# Patient Record
Sex: Male | Born: 1960 | Race: White | Hispanic: No | Smoking: Current some day smoker
Health system: Southern US, Community
[De-identification: ages and names within clinical notes are randomized; demographics above are authoritative.]

## PROBLEM LIST (undated history)

## (undated) DIAGNOSIS — K259 Gastric ulcer, unspecified as acute or chronic, without hemorrhage or perforation: Secondary | ICD-10-CM

## (undated) DIAGNOSIS — Z9289 Personal history of other medical treatment: Secondary | ICD-10-CM

## (undated) DIAGNOSIS — M79606 Pain in leg, unspecified: Secondary | ICD-10-CM

## (undated) DIAGNOSIS — F32A Depression, unspecified: Secondary | ICD-10-CM

## (undated) DIAGNOSIS — I639 Cerebral infarction, unspecified: Secondary | ICD-10-CM

## (undated) DIAGNOSIS — Z720 Tobacco use: Secondary | ICD-10-CM

## (undated) DIAGNOSIS — J449 Chronic obstructive pulmonary disease, unspecified: Secondary | ICD-10-CM

## (undated) DIAGNOSIS — F419 Anxiety disorder, unspecified: Secondary | ICD-10-CM

## (undated) HISTORY — DX: Cerebral infarction, unspecified: I63.9

## (undated) HISTORY — DX: Personal history of other medical treatment: Z92.89

## (undated) HISTORY — PX: ANKLE FRACTURE SURGERY: SHX122

## (undated) HISTORY — DX: Tobacco use: Z72.0

## (undated) HISTORY — DX: Pain in leg, unspecified: M79.606

## (undated) HISTORY — PX: APPENDECTOMY: SHX54

---

## 2004-06-04 ENCOUNTER — Inpatient Hospital Stay (HOSPITAL_COMMUNITY): Admission: EM | Admit: 2004-06-04 | Discharge: 2004-06-05 | Payer: Self-pay | Admitting: Emergency Medicine

## 2010-08-14 ENCOUNTER — Emergency Department (HOSPITAL_COMMUNITY)
Admission: EM | Admit: 2010-08-14 | Discharge: 2010-08-14 | Disposition: A | Discharge status: Home / Self Care | Payer: Self-pay | Attending: Emergency Medicine | Admitting: Emergency Medicine

## 2010-08-14 DIAGNOSIS — T622X1A Toxic effect of other ingested (parts of) plant(s), accidental (unintentional), initial encounter: Secondary | ICD-10-CM | POA: Insufficient documentation

## 2010-08-14 DIAGNOSIS — L299 Pruritus, unspecified: Secondary | ICD-10-CM | POA: Insufficient documentation

## 2010-08-14 DIAGNOSIS — L255 Unspecified contact dermatitis due to plants, except food: Secondary | ICD-10-CM | POA: Insufficient documentation

## 2010-08-14 DIAGNOSIS — Y929 Unspecified place or not applicable: Secondary | ICD-10-CM | POA: Insufficient documentation

## 2010-09-06 ENCOUNTER — Emergency Department (HOSPITAL_COMMUNITY)
Admission: EM | Admit: 2010-09-06 | Discharge: 2010-09-06 | Disposition: A | Discharge status: Home / Self Care | Payer: Self-pay | Attending: Emergency Medicine | Admitting: Emergency Medicine

## 2010-09-06 DIAGNOSIS — R21 Rash and other nonspecific skin eruption: Secondary | ICD-10-CM | POA: Insufficient documentation

## 2010-10-20 ENCOUNTER — Emergency Department (HOSPITAL_COMMUNITY)
Admission: EM | Admit: 2010-10-20 | Discharge: 2010-10-20 | Disposition: A | Discharge status: Home / Self Care | Payer: Self-pay | Attending: Emergency Medicine | Admitting: Emergency Medicine

## 2010-10-20 DIAGNOSIS — M7989 Other specified soft tissue disorders: Secondary | ICD-10-CM | POA: Insufficient documentation

## 2010-10-20 DIAGNOSIS — L255 Unspecified contact dermatitis due to plants, except food: Secondary | ICD-10-CM | POA: Insufficient documentation

## 2010-11-08 ENCOUNTER — Emergency Department (HOSPITAL_COMMUNITY)
Admission: EM | Admit: 2010-11-08 | Discharge: 2010-11-08 | Disposition: A | Discharge status: Home / Self Care | Payer: Self-pay | Attending: Emergency Medicine | Admitting: Emergency Medicine

## 2010-11-08 DIAGNOSIS — R29810 Facial weakness: Secondary | ICD-10-CM | POA: Insufficient documentation

## 2010-11-08 DIAGNOSIS — G510 Bell's palsy: Secondary | ICD-10-CM | POA: Insufficient documentation

## 2010-11-08 DIAGNOSIS — R209 Unspecified disturbances of skin sensation: Secondary | ICD-10-CM | POA: Insufficient documentation

## 2010-12-31 ENCOUNTER — Emergency Department (HOSPITAL_COMMUNITY): Payer: Self-pay

## 2010-12-31 ENCOUNTER — Emergency Department (HOSPITAL_COMMUNITY)
Admission: EM | Admit: 2010-12-31 | Discharge: 2010-12-31 | Discharge status: Against Medical Advice | Payer: Self-pay | Attending: Emergency Medicine | Admitting: Emergency Medicine

## 2010-12-31 DIAGNOSIS — R110 Nausea: Secondary | ICD-10-CM | POA: Insufficient documentation

## 2010-12-31 DIAGNOSIS — M79609 Pain in unspecified limb: Secondary | ICD-10-CM | POA: Insufficient documentation

## 2010-12-31 DIAGNOSIS — R61 Generalized hyperhidrosis: Secondary | ICD-10-CM | POA: Insufficient documentation

## 2010-12-31 DIAGNOSIS — F17200 Nicotine dependence, unspecified, uncomplicated: Secondary | ICD-10-CM | POA: Insufficient documentation

## 2010-12-31 DIAGNOSIS — R42 Dizziness and giddiness: Secondary | ICD-10-CM | POA: Insufficient documentation

## 2010-12-31 DIAGNOSIS — R0602 Shortness of breath: Secondary | ICD-10-CM | POA: Insufficient documentation

## 2010-12-31 DIAGNOSIS — R079 Chest pain, unspecified: Secondary | ICD-10-CM | POA: Insufficient documentation

## 2010-12-31 DIAGNOSIS — M7989 Other specified soft tissue disorders: Secondary | ICD-10-CM | POA: Insufficient documentation

## 2010-12-31 DIAGNOSIS — M25579 Pain in unspecified ankle and joints of unspecified foot: Secondary | ICD-10-CM | POA: Insufficient documentation

## 2010-12-31 LAB — CBC
HCT: 42.7 % (ref 39.0–52.0)
Hemoglobin: 14.9 g/dL (ref 13.0–17.0)
MCH: 30.6 pg (ref 26.0–34.0)
MCHC: 34.9 g/dL (ref 30.0–36.0)
MCV: 87.7 fL (ref 78.0–100.0)
Platelets: 217 10*3/uL (ref 150–400)
RBC: 4.87 MIL/uL (ref 4.22–5.81)
RDW: 13.1 % (ref 11.5–15.5)
WBC: 6.3 10*3/uL (ref 4.0–10.5)

## 2010-12-31 LAB — COMPREHENSIVE METABOLIC PANEL
ALT: 25 U/L (ref 0–53)
AST: 17 U/L (ref 0–37)
Albumin: 3.6 g/dL (ref 3.5–5.2)
Alkaline Phosphatase: 84 U/L (ref 39–117)
BUN: 10 mg/dL (ref 6–23)
CO2: 26 mEq/L (ref 19–32)
Calcium: 8.8 mg/dL (ref 8.4–10.5)
Chloride: 104 mEq/L (ref 96–112)
Creatinine, Ser: 0.74 mg/dL (ref 0.50–1.35)
GFR calc Af Amer: >60 mL/min (ref >60)
GFR calc non Af Amer: >60 mL/min (ref >60)
Glucose, Bld: 83 mg/dL (ref 70–99)
Potassium: 4 mEq/L (ref 3.5–5.1)
Sodium: 138 mEq/L (ref 135–145)
Total Bilirubin: 0.3 mg/dL (ref 0.3–1.2)
Total Protein: 6.6 g/dL (ref 6.0–8.3)

## 2010-12-31 LAB — DIFFERENTIAL
Basophils Absolute: 0 10*3/uL (ref 0.0–0.1)
Basophils Relative: 1 % (ref 0–1)
Eosinophils Absolute: 0.2 10*3/uL (ref 0.0–0.7)
Eosinophils Relative: 3 % (ref 0–5)
Lymphocytes Relative: 28 % (ref 12–46)
Lymphs Abs: 1.8 10*3/uL (ref 0.7–4.0)
Monocytes Relative: 11 % (ref 3–12)
Neutro Abs: 3.7 10*3/uL (ref 1.7–7.7)
Neutrophils Relative %: 58 % (ref 43–77)

## 2010-12-31 LAB — CK TOTAL AND CKMB (NOT AT ARMC)
CK, MB: 1.7 ng/mL (ref 0.3–4.0)
Relative Index: INVALID (ref 0.0–2.5)

## 2010-12-31 LAB — D-DIMER, QUANTITATIVE: D-Dimer, Quant: 0.44 ug/mL-FEU (ref 0.00–0.48)

## 2010-12-31 LAB — TROPONIN I: Troponin I: <0.3 ng/mL (ref <0.30)

## 2011-09-12 ENCOUNTER — Other Ambulatory Visit: Payer: Self-pay

## 2011-09-12 ENCOUNTER — Emergency Department (HOSPITAL_COMMUNITY): Payer: Self-pay

## 2011-09-12 ENCOUNTER — Emergency Department (HOSPITAL_COMMUNITY)
Admission: EM | Admit: 2011-09-12 | Discharge: 2011-09-13 | Disposition: A | Discharge status: Home / Self Care | Payer: Self-pay | Attending: Emergency Medicine | Admitting: Emergency Medicine

## 2011-09-12 ENCOUNTER — Encounter (HOSPITAL_COMMUNITY): Payer: Self-pay | Admitting: Emergency Medicine

## 2011-09-12 DIAGNOSIS — R071 Chest pain on breathing: Secondary | ICD-10-CM | POA: Insufficient documentation

## 2011-09-12 DIAGNOSIS — R0789 Other chest pain: Secondary | ICD-10-CM

## 2011-09-12 DIAGNOSIS — R0602 Shortness of breath: Secondary | ICD-10-CM | POA: Insufficient documentation

## 2011-09-12 DIAGNOSIS — J40 Bronchitis, not specified as acute or chronic: Secondary | ICD-10-CM | POA: Insufficient documentation

## 2011-09-12 DIAGNOSIS — F17200 Nicotine dependence, unspecified, uncomplicated: Secondary | ICD-10-CM | POA: Insufficient documentation

## 2011-09-12 DIAGNOSIS — IMO0001 Reserved for inherently not codable concepts without codable children: Secondary | ICD-10-CM | POA: Insufficient documentation

## 2011-09-12 DIAGNOSIS — R05 Cough: Secondary | ICD-10-CM

## 2011-09-12 DIAGNOSIS — R079 Chest pain, unspecified: Secondary | ICD-10-CM | POA: Insufficient documentation

## 2011-09-12 DIAGNOSIS — J3489 Other specified disorders of nose and nasal sinuses: Secondary | ICD-10-CM | POA: Insufficient documentation

## 2011-09-12 DIAGNOSIS — Z7982 Long term (current) use of aspirin: Secondary | ICD-10-CM | POA: Insufficient documentation

## 2011-09-12 DIAGNOSIS — R042 Hemoptysis: Secondary | ICD-10-CM | POA: Insufficient documentation

## 2011-09-12 LAB — BASIC METABOLIC PANEL
BUN: 13 mg/dL (ref 6–23)
CO2: 25 mEq/L (ref 19–32)
Calcium: 8.4 mg/dL (ref 8.4–10.5)
Creatinine, Ser: 0.92 mg/dL (ref 0.50–1.35)
GFR calc Af Amer: >90 mL/min (ref >90)

## 2011-09-12 LAB — CBC
HCT: 42.7 % (ref 39.0–52.0)
MCH: 30 pg (ref 26.0–34.0)
MCV: 89 fL (ref 78.0–100.0)
Platelets: 195 10*3/uL (ref 150–400)
RDW: 12.6 % (ref 11.5–15.5)

## 2011-09-12 LAB — POCT I-STAT, CHEM 8
Calcium, Ion: 1.1 mmol/L — ABNORMAL LOW (ref 1.12–1.32)
Creatinine, Ser: 1 mg/dL (ref 0.50–1.35)
Hemoglobin: 14.6 g/dL (ref 13.0–17.0)
Sodium: 138 mEq/L (ref 135–145)
TCO2: 24 mmol/L (ref 0–100)

## 2011-09-12 LAB — POCT I-STAT TROPONIN I

## 2011-09-12 NOTE — ED Notes (Signed)
Patient reports chest pain (along with multiple complaints) that started four days ago; patient reports location of chest pain as "generalized".  Describes pain as "aching"; chest pain becomes worse when he coughs.  Patient reports coughing up blood the past two days, but that he has not had any episodes today.  Patient reports also reports headache, left abdominal pain, nausea, and diarrhea that started four days ago.  Patient actively coughing in triage; non-productive. EKG done in triage.

## 2011-09-12 NOTE — ED Notes (Signed)
Lab at bedside

## 2011-09-12 NOTE — ED Notes (Signed)
EKG given to and signed by Dr. Rubin Payor.

## 2011-09-13 LAB — POCT I-STAT TROPONIN I: Troponin i, poc: 0 ng/mL (ref 0.00–0.08)

## 2011-09-13 MED ORDER — BENZONATATE 100 MG PO CAPS: 100.0000 mg | ORAL_CAPSULE | Freq: Three times a day (TID) | ORAL | Status: AC | PRN

## 2011-09-13 MED ORDER — IPRATROPIUM BROMIDE 0.02 % IN SOLN
0.5000 mg | Freq: Once | RESPIRATORY_TRACT | Status: AC
  Administered 2011-09-13: 0.5 mg via RESPIRATORY_TRACT
  Filled 2011-09-13: qty 2.5

## 2011-09-13 MED ORDER — ALBUTEROL SULFATE HFA 108 (90 BASE) MCG/ACT IN AERS: 2.0000 | INHALATION_SPRAY | RESPIRATORY_TRACT | Status: DC

## 2011-09-13 MED ORDER — ALBUTEROL SULFATE (5 MG/ML) 0.5% IN NEBU
5.0000 mg | INHALATION_SOLUTION | Freq: Once | RESPIRATORY_TRACT | Status: AC
  Administered 2011-09-13: 5 mg via RESPIRATORY_TRACT
  Filled 2011-09-13: qty 1

## 2011-09-13 NOTE — ED Provider Notes (Signed)
History     CSN: [id number]  Arrival date & time 09/12/11  2002   First MD Initiated Contact with Patient 09/12/11 2306      Chief Complaint  Patient presents with  . Cough    HPI Pt was seen at 0005.  Per pt, c/o gradual onset and persistence of constant cough, and bilateral lateral ribs "pain" for the past 4 days.  States the ribs pain worsens with coughing.  Has been assoc with runny/stuffy nose and sinus congestion.  States 2 days ago he coughed and "had some blood" in his sputum, which has since resolved.  Denies palpitations, no SOB, no back pain, no abd pain, no N/V/D, no fevers, no rash.    Past Medical History  Diagnosis Date  . No significant medical problems     Past Surgical History  Procedure Date  . Ankle fracture surgery     History  Substance Use Topics  . Smoking status: Current Everyday Smoker -- 1.0 packs/day  . Smokeless tobacco: Not on file  . Alcohol Use: Yes     Occassional Use    Review of Systems ROS: Statement: All systems negative except as marked or noted in the HPI; Constitutional: Negative for fever and chills. ; ; Eyes: Negative for eye pain, redness and discharge. ; ; ENMT: Negative for ear pain, hoarseness, sore throat.  +rhinorrhea, nasal congestion, sinus pressure. ; ; Cardiovascular: +CP.  Negative for palpitations, diaphoresis, dyspnea and peripheral edema. ; ; Respiratory: +cough. Negative for wheezing and stridor. ; ; Gastrointestinal: Negative for nausea, vomiting, diarrhea, abdominal pain, blood in stool, hematemesis, jaundice and rectal bleeding. . ; ; Genitourinary: Negative for dysuria, flank pain and hematuria. ; ; Musculoskeletal: Negative for back pain and neck pain. Negative for swelling and trauma.; ; Skin: Negative for pruritus, rash, abrasions, blisters, bruising and skin lesion.; ; Neuro: Negative for headache, lightheadedness and neck stiffness. Negative for weakness, altered level of consciousness , altered mental status,  extremity weakness, paresthesias, involuntary movement, seizure and syncope.      Allergies  Review of patient's allergies indicates no known allergies.  Home Medications   Current Outpatient Rx  Name Route Sig Dispense Refill  . ASPIRIN EC 81 MG PO TBEC Oral Take 81 mg by mouth daily.      BP 131/65  Pulse 76  Temp(Src) 98.3 F (36.8 C) (Oral)  Resp 18  SpO2 96%  Physical Exam 0005: Physical examination:  Nursing notes reviewed; Vital signs and O2 SAT reviewed;  Constitutional: Well developed, Well nourished, Well hydrated, In no acute distress; Head:  Normocephalic, atraumatic; Eyes: EOMI, PERRL, No scleral icterus; ENMT: TM's clear bilat.  Mouth and pharynx normal, Mucous membranes moist; Neck: Supple, Full range of motion, No lymphadenopathy; Cardiovascular: Regular rate and rhythm, No murmur, rub, or gallop; Respiratory: Breath sounds clear & equal bilaterally, No rales, rhonchi, wheezes, or rub, Normal respiratory effort/excursion, non-productive cough during exam, speaking full sentences with ease; Chest: No soft tissue crepitus, Movement normal; Abdomen: Soft, Nontender, Nondistended, Normal bowel sounds; Extremities: Pulses normal, No tenderness, No edema, No calf edema or asymmetry.; Neuro: AA&Ox3, Major CN grossly intact.  No gross focal motor or sensory deficits in extremities.; Skin: Color normal, Warm, Dry, no rash.    ED Course  Procedures    MDM  MDM Reviewed: nursing note and vitals Reviewed previous: ECG Interpretation: ECG, labs and x-ray    Date: 09/13/2011  Rate: 88  Rhythm: normal sinus rhythm  QRS Axis: normal  Intervals: normal  ST/T Wave abnormalities: normal  Conduction Disutrbances:none  Narrative Interpretation:   Old EKG Reviewed: unchanged; no significant changes from previous EKG dated 7172012.  Results for orders placed during the hospital encounter of 09/12/11  CBC      Component Value Range   WBC 6.2  4.0 - 10.5 (K/uL)   RBC 4.80   4.22 - 5.81 (MIL/uL)   Hemoglobin 14.4  13.0 - 17.0 (g/dL)   HCT 46.9  62.9 - 52.8 (%)   MCV 89.0  78.0 - 100.0 (fL)   MCH 30.0  26.0 - 34.0 (pg)   MCHC 33.7  30.0 - 36.0 (g/dL)   RDW 41.3  24.4 - 01.0 (%)   Platelets 195  150 - 400 (K/uL)  BASIC METABOLIC PANEL      Component Value Range   Sodium 136  135 - 145 (mEq/L)   Potassium 3.5  3.5 - 5.1 (mEq/L)   Chloride 101  96 - 112 (mEq/L)   CO2 25  19 - 32 (mEq/L)   Glucose, Bld 85  70 - 99 (mg/dL)   BUN 13  6 - 23 (mg/dL)   Creatinine, Ser 2.72  0.50 - 1.35 (mg/dL)   Calcium 8.4  8.4 - 53.6 (mg/dL)   GFR calc non Af Amer >90  >90 (mL/min)   GFR calc Af Amer >90  >90 (mL/min)  POCT I-STAT, CHEM 8      Component Value Range   Sodium 138  135 - 145 (mEq/L)   Potassium 3.6  3.5 - 5.1 (mEq/L)   Chloride 104  96 - 112 (mEq/L)   BUN 13  6 - 23 (mg/dL)   Creatinine, Ser 6.44  0.50 - 1.35 (mg/dL)   Glucose, Bld 86  70 - 99 (mg/dL)   Calcium, Ion 0.34 (*) 1.12 - 1.32 (mmol/L)   TCO2 24  0 - 100 (mmol/L)   Hemoglobin 14.6  13.0 - 17.0 (g/dL)   HCT 74.2  59.5 - 63.8 (%)  POCT I-STAT TROPONIN I      Component Value Range   Troponin i, poc 0.00  0.00 - 0.08 (ng/mL)   Comment 3           POCT I-STAT TROPONIN I      Component Value Range   Troponin i, poc 0.00  0.00 - 0.08 (ng/mL)   Comment 3            Dg Chest 2 View 09/12/2011  *RADIOLOGY REPORT*  Clinical Data: Sharp mid chest pain radiating up the left side. Shortness of breath and cough.  CHEST - 2 VIEW  Comparison: Chest x-ray 12/31/2010.  Findings: Lung volumes are normal.  No consolidative airspace disease.  No pleural effusions.  Mild diffuse interstitial prominence with thickening of the central airways is noted, similar to prior examination.  Pulmonary vasculature is within normal limits.  The cardiomediastinal silhouette is unremarkable.  IMPRESSION: 1.  Mild thickening the central airways and prominence of the interstitial markings in the lungs bilaterally.  This appears to be  chronic compared to prior examinations, and can be related to chronic bronchitis or reactive airway disease. 2.  No definite radiographic evidence of superimposed acute cardiopulmonary disease.  Original Report Authenticated By: Florencia Reasons, M.D.     1:48 AM:  Pt wants to go home now after neb, coughing has improved.  Pt has gotten himself dressed and is sitting on the bed asking to be discharged.  Pt and family both very  vague historians but pt does keep to his HPI told to me that his CP is more in his bilat lateral ribs area, is constant, and began 4 days ago "after coughing a whole lot."  Denies abd pain, no palpitations, no SOB, no fevers.  EKG unchanged from previous, troponin x2 negative; doubt ACS.  Doubt PE with normal O2 Sat, no tachypnea, no tachycardia or hypoxia on today's ED visit; no recent travel, no calf/LE pain or unilateral swelling.  Pt encouraged to stop smoking.  Dx testing d/w pt and family.  Questions answered.  Verb understanding, agreeable to d/c home with outpt f/u.  0200:  Pt apparent left before being given his d/c papers.         Laray Anger, DO 09/15/11 [phone]

## 2011-09-13 NOTE — Discharge Instructions (Signed)
RESOURCE GUIDE  Dental Problems  Patients with Medicaid: [facility]                     [facility] [address]                                           [address] Phone:  [phone]                                                  Phone:  [phone]  If unable to pay or uninsured, contact:  [facility] or [facility] to become qualified for the adult dental clinic.  Chronic Pain Problems Contact [facility]  [phone] Patients need to be referred by their primary care doctor.  Insufficient Money for Medicine Contact [employer]:  call "211" or [facility] 271-5999.  No Primary Care Doctor Call [facility]  [phone] Other agencies that provide inexpensive medical care    [facility]  [phone]    [facility]  [phone]    [facility]  [phone]    Women[facility] Clinic  [phone]    [facility]  [phone]    [facility]  [phone]  Psychological Services [facility]  [phone] [facility]  [phone] [facility]   [phone] (emergency services [phone])  Substance Abuse Resources [facility]  336-882-2125 [facility] 336-784-9470 [facility] 336-285-9073 [facility] 336-845-3988 Residential & Outpatient Substance Abuse Program  800-659-3381  Abuse/Neglect [place] Child Abuse Hotline (336) 641-3795 [place] Child Abuse Hotline 800-378-5315 (After Hours)  Emergency Shelter [facility] (336) 271-5985  Maternity Homes [facility] (336) 275-9566 [facility] (704) 372-4663  MRSA Hotline #:   832-7006    [place] [facility]  [facility] [place]     [employer]                          [facility] [address] [place]                       [address]      [address]  [place]                                                [place]                            [place] Phone:  [phone]                                   Phone:  [phone]                 Phone:  [phone]  [facility] Phone:  [phone]    [facility] Child Abuse Hotline (831) 458-7659 501-177-6536 (After Hours)   Take the prescription as directed.  Use the albuterol inhaler:  2 to 4 puffs every 4 hours for the next 7 days.  Call your regular medical doctor on Monday to schedule a follow up appointment this week.  Return to the Emergency Department immediately sooner if worsening.

## 2012-12-24 ENCOUNTER — Other Ambulatory Visit (HOSPITAL_COMMUNITY): Payer: Self-pay | Admitting: *Deleted

## 2012-12-24 ENCOUNTER — Ambulatory Visit (HOSPITAL_COMMUNITY)
Admission: RE | Admit: 2012-12-24 | Discharge: 2012-12-24 | Disposition: A | Discharge status: Home / Self Care | Payer: Medicaid Other | Source: Ambulatory Visit | Attending: Diagnostic Radiology | Admitting: Diagnostic Radiology

## 2012-12-24 DIAGNOSIS — R209 Unspecified disturbances of skin sensation: Secondary | ICD-10-CM | POA: Insufficient documentation

## 2012-12-24 DIAGNOSIS — M542 Cervicalgia: Secondary | ICD-10-CM

## 2012-12-24 DIAGNOSIS — Y838 Other surgical procedures as the cause of abnormal reaction of the patient, or of later complication, without mention of misadventure at the time of the procedure: Secondary | ICD-10-CM | POA: Insufficient documentation

## 2012-12-24 DIAGNOSIS — Z8781 Personal history of (healed) traumatic fracture: Secondary | ICD-10-CM | POA: Insufficient documentation

## 2012-12-24 DIAGNOSIS — T84498A Other mechanical complication of other internal orthopedic devices, implants and grafts, initial encounter: Secondary | ICD-10-CM | POA: Insufficient documentation

## 2012-12-24 DIAGNOSIS — M25519 Pain in unspecified shoulder: Secondary | ICD-10-CM | POA: Insufficient documentation

## 2012-12-24 DIAGNOSIS — M5030 Other cervical disc degeneration, unspecified cervical region: Secondary | ICD-10-CM | POA: Insufficient documentation

## 2013-02-11 ENCOUNTER — Emergency Department (HOSPITAL_COMMUNITY): Payer: Medicaid Other

## 2013-02-11 ENCOUNTER — Encounter (HOSPITAL_COMMUNITY): Payer: Self-pay | Admitting: *Deleted

## 2013-02-11 ENCOUNTER — Emergency Department (HOSPITAL_COMMUNITY)
Admission: EM | Admit: 2013-02-11 | Discharge: 2013-02-11 | Disposition: A | Discharge status: Home / Self Care | Payer: Medicaid Other | Attending: Emergency Medicine | Admitting: Emergency Medicine

## 2013-02-11 DIAGNOSIS — R209 Unspecified disturbances of skin sensation: Secondary | ICD-10-CM | POA: Insufficient documentation

## 2013-02-11 DIAGNOSIS — Z8679 Personal history of other diseases of the circulatory system: Secondary | ICD-10-CM | POA: Insufficient documentation

## 2013-02-11 DIAGNOSIS — R05 Cough: Secondary | ICD-10-CM | POA: Insufficient documentation

## 2013-02-11 DIAGNOSIS — Z789 Other specified health status: Secondary | ICD-10-CM | POA: Insufficient documentation

## 2013-02-11 DIAGNOSIS — R079 Chest pain, unspecified: Secondary | ICD-10-CM | POA: Insufficient documentation

## 2013-02-11 DIAGNOSIS — M542 Cervicalgia: Secondary | ICD-10-CM | POA: Insufficient documentation

## 2013-02-11 DIAGNOSIS — F17200 Nicotine dependence, unspecified, uncomplicated: Secondary | ICD-10-CM | POA: Insufficient documentation

## 2013-02-11 DIAGNOSIS — R0602 Shortness of breath: Secondary | ICD-10-CM | POA: Insufficient documentation

## 2013-02-11 DIAGNOSIS — R059 Cough, unspecified: Secondary | ICD-10-CM | POA: Insufficient documentation

## 2013-02-11 DIAGNOSIS — R61 Generalized hyperhidrosis: Secondary | ICD-10-CM | POA: Insufficient documentation

## 2013-02-11 DIAGNOSIS — R51 Headache: Secondary | ICD-10-CM | POA: Insufficient documentation

## 2013-02-11 DIAGNOSIS — M25476 Effusion, unspecified foot: Secondary | ICD-10-CM | POA: Insufficient documentation

## 2013-02-11 DIAGNOSIS — M25473 Effusion, unspecified ankle: Secondary | ICD-10-CM | POA: Insufficient documentation

## 2013-02-11 LAB — CBC
Hemoglobin: 15.2 g/dL (ref 13.0–17.0)
RBC: 5.07 MIL/uL (ref 4.22–5.81)
WBC: 7.2 10*3/uL (ref 4.0–10.5)

## 2013-02-11 LAB — BASIC METABOLIC PANEL
CO2: 26 mEq/L (ref 19–32)
Glucose, Bld: 108 mg/dL — ABNORMAL HIGH (ref 70–99)
Potassium: 3.9 mEq/L (ref 3.5–5.1)
Sodium: 137 mEq/L (ref 135–145)

## 2013-02-11 LAB — D-DIMER, QUANTITATIVE: D-Dimer, Quant: 0.27 ug/mL-FEU (ref 0.00–0.48)

## 2013-02-11 MED ORDER — ASPIRIN 81 MG PO CHEW
324.0000 mg | CHEWABLE_TABLET | Freq: Once | ORAL | Status: AC
  Administered 2013-02-11: 324 mg via ORAL

## 2013-02-11 MED ORDER — ASPIRIN 81 MG PO CHEW
CHEWABLE_TABLET | ORAL | Status: AC
  Administered 2013-02-11: 324 mg via ORAL
  Filled 2013-02-11: qty 4

## 2013-02-11 NOTE — ED Notes (Signed)
Right sided chest pain began 3 days ago. Intermittent pain. Also states SOB at times. Also states pain when turning neck to the right. NAD. Also c/o of pain to upper back

## 2013-02-11 NOTE — ED Provider Notes (Signed)
CSN: [id number]     Arrival date & time 02/11/13  1005 History  This chart was scribed for Toy Baker, MD by Karle Plumber, ED Scribe. This patient was seen in room APA15/APA15 and the patient's care was started at 10:23 AM . Chief Complaint  Patient presents with  . Chest Pain   The history is provided by the patient. No language interpreter was used.   HPI Comments:  Ian [name] is a 52 y.o. male who presents to the Emergency Department complaining of sharp, intermittent, right-sided chest pain that radiates to his back and lasts for up to 10 minutes with associated shortness of breath and diaphoresis for the past 3 days. He also reports intermittent right arm numbness onset this morning. Pt reports that nothing makes the pain better or worse. He reports a productive cough of yellow/green sputum. He also reports headache and neck pain with turning his head over the past 3 days, which he states is not normal for him.  He states that he has had similar chest pain last month that resolved on its own. Pt states that he has some ankle swelling, but with no recent changes. He denies any recent travel. Pt reports family history of cardiac disease with father dying at age 43 of massive MI. Pt reports smoking 1 pack everyday and using alcohol occasionally.  Past Medical History  Diagnosis Date  . No significant medical problems   . Poor historian    Past Surgical History  Procedure Laterality Date  . Ankle fracture surgery    . Appendectomy     No family history on file. History  Substance Use Topics  . Smoking status: Current Every Day Smoker -- 1.00 packs/day  . Smokeless tobacco: Not on file  . Alcohol Use: Yes     Comment: Occassional Use    Review of Systems  Constitutional: Positive for diaphoresis.  HENT: Positive for neck pain.   Respiratory: Positive for cough and shortness of breath.   Cardiovascular: Positive for chest pain. Negative for leg swelling.  Neurological:  Positive for numbness and headaches.  All other systems reviewed and are negative.    Allergies  Review of patient's allergies indicates no known allergies.  Home Medications   Current Outpatient Rx  Name  Route  Sig  Dispense  Refill  . Lansoprazole (PREVACID PO)   Oral   Take 1 capsule by mouth daily as needed (heartburn).          There were no vitals taken for this visit.  Physical Exam  Nursing note and vitals reviewed. Constitutional: He is oriented to person, place, and time. He appears well-developed and well-nourished.  HENT:  Head: Normocephalic and atraumatic.  Eyes: Conjunctivae and EOM are normal.  Neck: Normal range of motion. Neck supple.  Cardiovascular: Normal rate, regular rhythm and normal heart sounds.   Pulmonary/Chest: Effort normal and breath sounds normal. No respiratory distress. He has no wheezes. He has no rales. He exhibits no tenderness.  Chest non-tender.  Abdominal: Soft. Bowel sounds are normal. He exhibits no distension and no mass. There is no tenderness. There is no rebound and no guarding.  Abdomen non-tender.  Musculoskeletal: Normal range of motion. He exhibits no tenderness.  Neurological: He is alert and oriented to person, place, and time.  Skin: Skin is warm and dry. No rash noted.  Psychiatric: He has a normal mood and affect. His behavior is normal.    ED Course  Procedures (including critical  care time) DIAGNOSTIC STUDIES:    COORDINATION OF CARE: 10:30 AM-Patient advised of plan to obtain chest X-ray and lab work for treatment and patient agrees. Pt also agreed with plan to give ASA.  Labs Review Labs Reviewed  BASIC METABOLIC PANEL - Abnormal; Notable for the following:    Glucose, Bld 108 (*)    All other components within normal limits  CBC  TROPONIN I  D-DIMER, QUANTITATIVE   Imaging Review No results found.  MDM  No diagnosis found.  Date: 02/11/2013  Rate: 71  Rhythm: normal sinus rhythm  QRS Axis:  normal  Intervals: normal  ST/T Wave abnormalities: normal  Conduction Disutrbances:none  Narrative Interpretation:   Old EKG Reviewed: none available  Pt given aspirin on arrival, labs and xrays reviewed, concern for angina d/w pt--pt offered inpatient admission and has deferred--risk of sudden cardiac death explained to pt and his daughter and they understand, referrals given with return precautions  I personally performed the services described in this documentation, which was scribed in my presence. The recorded information has been reviewed and is accurate.     Toy Baker, MD 02/11/13 1139

## 2013-05-24 ENCOUNTER — Encounter (HOSPITAL_COMMUNITY): Payer: Self-pay | Admitting: Emergency Medicine

## 2013-05-24 ENCOUNTER — Emergency Department (HOSPITAL_COMMUNITY)
Admission: EM | Admit: 2013-05-24 | Discharge: 2013-05-24 | Disposition: A | Discharge status: Home / Self Care | Payer: Medicaid Other | Attending: Emergency Medicine | Admitting: Emergency Medicine

## 2013-05-24 ENCOUNTER — Emergency Department (HOSPITAL_COMMUNITY): Payer: Medicaid Other

## 2013-05-24 DIAGNOSIS — R079 Chest pain, unspecified: Secondary | ICD-10-CM | POA: Insufficient documentation

## 2013-05-24 DIAGNOSIS — R059 Cough, unspecified: Secondary | ICD-10-CM | POA: Insufficient documentation

## 2013-05-24 DIAGNOSIS — M25519 Pain in unspecified shoulder: Secondary | ICD-10-CM | POA: Insufficient documentation

## 2013-05-24 DIAGNOSIS — R05 Cough: Secondary | ICD-10-CM | POA: Insufficient documentation

## 2013-05-24 DIAGNOSIS — F17200 Nicotine dependence, unspecified, uncomplicated: Secondary | ICD-10-CM | POA: Insufficient documentation

## 2013-05-24 LAB — POCT I-STAT, CHEM 8
BUN: 15 mg/dL (ref 6–23)
Calcium, Ion: 1.18 mmol/L (ref 1.12–1.23)
Chloride: 104 mEq/L (ref 96–112)
Creatinine, Ser: 0.9 mg/dL (ref 0.50–1.35)
Glucose, Bld: 92 mg/dL (ref 70–99)
Hemoglobin: 16.7 g/dL (ref 13.0–17.0)
Sodium: 138 mEq/L (ref 135–145)
TCO2: 23 mmol/L (ref 0–100)

## 2013-05-24 LAB — POCT I-STAT TROPONIN I

## 2013-05-24 MED ORDER — ASPIRIN 81 MG PO CHEW
324.0000 mg | CHEWABLE_TABLET | Freq: Once | ORAL | Status: AC
  Administered 2013-05-24: 324 mg via ORAL
  Filled 2013-05-24: qty 4

## 2013-05-24 NOTE — ED Notes (Signed)
Pt c/o intermittent left side chest pain that radiates to left arm that started during the night, pt admits to sob, nausea. States that the pain started while he was walking to the restroom

## 2013-05-24 NOTE — ED Notes (Signed)
Patient with no complaints at this time. Respirations even and unlabored. Skin warm/dry. Discharge instructions reviewed with patient at this time. Patient given opportunity to voice concerns/ask questions. IV removed per policy and band-aid applied to site. Patient discharged at this time and left Emergency Department with steady gait.  

## 2013-05-24 NOTE — ED Notes (Signed)
POC troponin and chem 8 initiated.

## 2013-05-24 NOTE — ED Notes (Signed)
istat troponin initiated 

## 2013-05-24 NOTE — ED Provider Notes (Signed)
CSN: [id number]     Arrival date & time 05/24/13  1191 History  This chart was scribed for Hilario Quarry, MD by Ronal Fear, ED Scribe. This patient was seen in room APA06/APA06 and the patient's care was started at 8:54 AM.    Chief Complaint  Patient presents with  . Chest Pain   (Consider location/radiation/quality/duration/timing/severity/associated sxs/prior Treatment) Patient is a 52 y.o. male presenting with chest pain. The history is provided by the patient. No language interpreter was used.  Chest Pain Pain location:  L chest Pain quality: sharp   Pain radiates to:  Does not radiate Pain radiates to the back: no   Pain severity:  Mild Onset quality:  Sudden Timing:  Intermittent Progression:  Unchanged Chronicity:  Recurrent Relieved by:  None tried Worsened by:  Nothing tried Associated symptoms: cough   Associated symptoms: no abdominal pain, no back pain, no diaphoresis, no dizziness, no fever, no headache, no lower extremity edema, no nausea, no numbness, no shortness of breath, no syncope, not vomiting and no weakness   HPI Comments: ASCENCION COYE is a 52 y.o. male who presents to the Emergency Department complaining of 2 episodes of sudden onset sharp intermittent CP at 2:30 this morning for about 15 secs but he is not very sure while he was walking back from the bathroom. Pt has had CP off and on for about 8-10 yrs and his most recent incident was a few months ago.  He was seen [facility] at [facility] about 2-3 months ago. He also complains of left shoulder pain and a productive cough Pt is a smoker, he does not take any daily medication.   Past Medical History  Diagnosis Date  . No significant medical problems   . Poor historian    Past Surgical History  Procedure Laterality Date  . Ankle fracture surgery    . Appendectomy     No family history on file. History  Substance Use Topics  . Smoking status: Current Every Day Smoker -- 1.00 packs/day  . Smokeless  tobacco: Not on file  . Alcohol Use: Yes     Comment: Occassional Use    Review of Systems  Constitutional: Negative for fever, chills and diaphoresis.  Respiratory: Positive for cough. Negative for shortness of breath.   Cardiovascular: Positive for chest pain. Negative for syncope.  Gastrointestinal: Negative for nausea, vomiting and abdominal pain.  Musculoskeletal: Negative for back pain.  Neurological: Negative for dizziness, weakness, numbness and headaches.  All other systems reviewed and are negative.    Allergies  Review of patient's allergies indicates no known allergies.  Home Medications   Current Outpatient Rx  Name  Route  Sig  Dispense  Refill  . Lansoprazole (PREVACID PO)   Oral   Take 1 capsule by mouth daily as needed (heartburn).          BP 136/71  Pulse 78  Temp(Src) 98.1 F (36.7 C) (Oral)  Resp 16  Ht 5\' 9"  (1.753 m)  Wt 220 lb (99.791 kg)  BMI 32.47 kg/m2  SpO2 96% Physical Exam  Nursing note and vitals reviewed. Constitutional: He is oriented to person, place, and time. He appears well-developed and well-nourished.  HENT:  Head: Normocephalic and atraumatic.  Eyes: Pupils are equal, round, and reactive to light.  Neck: Neck supple.  Cardiovascular: Normal rate, regular rhythm and normal heart sounds.   No murmur heard. Pulmonary/Chest: Effort normal and breath sounds normal. No respiratory distress. He has no  wheezes.  Abdominal: Soft. Bowel sounds are normal. There is no tenderness. There is no rebound.  Musculoskeletal: He exhibits no edema.  Lymphadenopathy:    He has no cervical adenopathy.  Neurological: He is alert and oriented to person, place, and time.  Skin: Skin is warm and dry.  Psychiatric: He has a normal mood and affect.    ED Course  Procedures (including critical care time) DIAGNOSTIC STUDIES: COORDINATION OF CARE: 8:54 AM- Pt advised of plan for treatment and pt agrees.     Labs Review Labs Reviewed - No  data to display Imaging Review No results found.  EKG Interpretation    Date/Time:  Tuesday May 24 2013 08:40:10 EST Ventricular Rate:  74 PR Interval:  180 QRS Duration: 74 QT Interval:  346 QTC Calculation: 384 R Axis:   52 Text Interpretation:  Normal sinus rhythm Normal ECG When compared with ECG of 11-Feb-2013 10:32, Nonspecific T wave abnormality now evident in Lateral leads Confirmed by Bernece Gall MD, Drusilla Wampole ([id number]) on 05/24/2013 8:53:10 AM            MDM  No diagnosis found. This is a 52 year old male comes in today with atypical chest pain it does not appear to be cardiac in nature. He has states he has had this pain intermittently over 10 years. He has been seen for in the past. The pain occurred twice at 2:30 in the morning and is sharp lasting less than 15 seconds. He has a normal EKG with troponin and delta troponin normal. He is given return cautions and given the resource guide to followup with primary care Dr. I personally performed the services described in this documentation, which was scribed in my presence. The recorded information has been reviewed and considered.    Hilario Quarry, MD 05/24/13 [phone]

## 2013-08-15 ENCOUNTER — Encounter: Payer: Self-pay | Admitting: *Deleted

## 2013-08-17 ENCOUNTER — Encounter: Payer: Self-pay | Admitting: Cardiology

## 2013-08-17 ENCOUNTER — Ambulatory Visit (INDEPENDENT_AMBULATORY_CARE_PROVIDER_SITE_OTHER): Payer: Medicaid Other | Admitting: Cardiology

## 2013-08-17 VITALS — BP 143/86 | HR 82 | Ht 69.0 in | Wt 213.0 lb

## 2013-08-17 DIAGNOSIS — R079 Chest pain, unspecified: Secondary | ICD-10-CM

## 2013-08-17 NOTE — Patient Instructions (Signed)
The current medical regimen is effective;  continue present plan and medications.  Your physician has requested that you have an exercise tolerance test. For further information please visit [email or web address]. Please also follow instruction sheet, as given.   

## 2013-08-17 NOTE — Progress Notes (Signed)
HPI The patient presents as a new patient. He describes chest discomfort. This has been going on for a couple of years but seems to be getting worse. It is sporadic but occasionally happens with exercise. He describes a sharp discomfort on the mid lower right chest. He doesn't routinely bring it on with activities but it can occur with activities or at rest. It might last for about 30 seconds. He might get some discomfort down his left arm. He might get diaphoretic. He doesn't necessarily however if symptoms with it. He doesn't necessarily get nauseated with it. He might get short of breath at times but not the pain. He's not describing PND or orthopnea. He's not describing palpitations, presyncope or syncope. He has many somatic complaints including any joint pains. He has not had any prior cardiac workup. He does have long-standing smoking and a family history of heart disease.  Allergies  Allergen Reactions  . Asa [Aspirin]   . Tylenol [Acetaminophen]     Current Outpatient Prescriptions  Medication Sig Dispense Refill  . ALPRAZolam (XANAX) 1 MG tablet Take 1 mg by mouth at bedtime as needed for anxiety.      . ergocalciferol (VITAMIN D2) 50000 UNITS capsule Take 50,000 Units by mouth once a week.       No current facility-administered medications for this visit.    Past Medical History  Diagnosis Date  . Tobacco abuse     Past Surgical History  Procedure Laterality Date  . Ankle fracture surgery    . Appendectomy      Family History  Problem Relation Age of Onset  . CAD Father 68    Died of MI  . CAD Paternal Grandfather 68    Died of MI  . Heart failure Mother 56    Alive  . Heart disease Daughter     Rapid heart rate    History   Social History  . Marital Status: Divorced    Spouse Name: N/A    Number of Children: 5  . Years of Education: N/A   Occupational History  .     Social History Main Topics  . Smoking status: Current Every Day Smoker -- 1.00  packs/day for 35 years    Types: Cigarettes  . Smokeless tobacco: Not on file  . Alcohol Use: Yes     Comment: Occassional Use  . Drug Use: No  . Sexual Activity: Not on file   Other Topics Concern  . Not on file   Social History Narrative   Lives with     ROS:  Positive for a joint pain, leg swelling and cramps. Otherwise as stated in the HPI and negative for all other systems.  PHYSICAL EXAM BP 143/86  Pulse 82  Ht 5\' 9"  (1.753 m)  Wt 213 lb (96.616 kg)  BMI 31.44 kg/m2 .GENERAL:  Well appearing HEENT:  Pupils equal round and reactive, fundi not visualized, oral mucosa unremarkable NECK:  No jugular venous distention, waveform within normal limits, carotid upstroke brisk and symmetric, no bruits, no thyromegaly LYMPHATICS:  No cervical, inguinal adenopathy LUNGS:  Clear to auscultation bilaterally BACK:  No CVA tenderness CHEST:  Unremarkable HEART:  PMI not displaced or sustained,S1 and S2 within normal limits, no S3, no S4, no clicks, no rubs, no murmurs ABD:  Flat, positive bowel sounds normal in frequency in pitch, no bruits, no rebound, no guarding, no midline pulsatile mass, no hepatomegaly, no splenomegaly EXT:  2 plus pulses throughout, no  edema, no cyanosis no clubbing SKIN:  No rashes no nodules NEURO:  Cranial nerves II through XII grossly intact, motor grossly intact throughout PSYCH:  Cognitively intact, oriented to person place and time  EKG:  Sinus rhythm, rate 81, axis within normal limits, intervals within normal limits, no acute ST-T wave changes.  08/17/2013   ASSESSMENT AND PLAN  CHEST PAIN:  His chest pain has atypical greater than typical features but he does have cardiovascular risk factors. I will bring the patient back for a POET (Plain Old Exercise Test). This will allow me to screen for obstructive coronary disease, risk stratify and very importantly provide a prescription for exercise.  TOBACCO ABUSE:  We discussed a specific strategy for  tobacco cessation.    HTN:  His blood pressure is slightly elevated. However, this is the first reading I have so I will defer management to Dr. Lysbeth GalasNyland

## 2013-08-20 DIAGNOSIS — R079 Chest pain, unspecified: Secondary | ICD-10-CM | POA: Insufficient documentation

## 2013-09-20 ENCOUNTER — Ambulatory Visit (INDEPENDENT_AMBULATORY_CARE_PROVIDER_SITE_OTHER): Payer: Medicaid Other | Admitting: Physician Assistant

## 2013-09-20 DIAGNOSIS — Z720 Tobacco use: Secondary | ICD-10-CM

## 2013-09-20 DIAGNOSIS — R079 Chest pain, unspecified: Secondary | ICD-10-CM

## 2013-09-20 DIAGNOSIS — M79604 Pain in right leg: Secondary | ICD-10-CM

## 2013-09-20 DIAGNOSIS — F17200 Nicotine dependence, unspecified, uncomplicated: Secondary | ICD-10-CM

## 2013-09-20 DIAGNOSIS — M79609 Pain in unspecified limb: Secondary | ICD-10-CM

## 2013-09-20 DIAGNOSIS — M79605 Pain in left leg: Secondary | ICD-10-CM

## 2013-09-20 NOTE — Progress Notes (Signed)
Exercise Treadmill Test  Pre-Exercise Testing Evaluation Rhythm: normal sinus  Rate: 82 bpm     Test  Exercise Tolerance Test Ordering MD: Angelina SheriffJake Hochrein, MD  Interpreting MD: Tereso NewcomerScott Weaver, PA-C  Unique Test No: 1  Treadmill:  1  Indication for ETT: chest pain - rule out ischemia  Contraindication to ETT: No   Stress Modality: exercise - treadmill  Cardiac Imaging Performed: non   Protocol: standard Bruce - maximal  Max BP:  218/70  Max MPHR (bpm):  168 85% MPR (bpm):  143  MPHR obtained (bpm):  129 % MPHR obtained:  76  Reached 85% MPHR (min:sec):  n/a Total Exercise Time (min-sec):  7:09  Workload in METS:  8.7 Borg Scale: 19  Reason ETT Terminated:  fatigue    ST Segment Analysis At Rest: normal ST segments - no evidence of significant ST depression With Exercise: no evidence of significant ST depression  Other Information Arrhythmia:  No Angina during ETT:  absent (0) Quality of ETT:  non-diagnostic  ETT Interpretation:  normal - no evidence of ischemia by ST analysis at submaximal exercise.  Comments: Fair exercise capacity. No chest pain. Patient complained of dyspnea and R leg (thigh) pain with exercise. Normal BP response to exercise. No ST changes to suggest ischemia at submaximal exercise.   Recommendations: Given long hx of smoking, FHx of CAD and poor exercise tolerance, will arrange Lexiscan Myoview. With R thigh pain with walking concerning for claudication in a lifelong smoker, will arrange ABIs. F/u with Dr. Rollene RotundaJames Hochrein as planned. Signed,  Tereso NewcomerScott Weaver, PA-C   09/20/2013 9:26 AM

## 2013-09-20 NOTE — Patient Instructions (Signed)
Your physician has requested that you have a lexiscan myoview. For further information please visit [email or web address]. Please follow instruction sheet, as given.  Your physician has requested that you have a lower extremity arterial doppler TO BE DONE WITH ABI'S. DX LEG PAIN, TOBACCO ABUSE. This test is an ultrasound of the arteries in the legs or arms. It looks at arterial blood flow in the legs and arms. Allow one hour for Lower and Upper Arterial scans. There are no restrictions or special instructions

## 2013-09-29 ENCOUNTER — Ambulatory Visit (HOSPITAL_COMMUNITY): Payer: Medicaid Other | Attending: Cardiology | Admitting: Radiology

## 2013-09-29 ENCOUNTER — Ambulatory Visit (HOSPITAL_COMMUNITY): Payer: Medicaid Other | Attending: Cardiology | Admitting: Cardiology

## 2013-09-29 ENCOUNTER — Encounter: Payer: Self-pay | Admitting: Cardiology

## 2013-09-29 VITALS — BP 126/68 | HR 71 | Ht 69.0 in | Wt 205.0 lb

## 2013-09-29 DIAGNOSIS — Z720 Tobacco use: Secondary | ICD-10-CM

## 2013-09-29 DIAGNOSIS — M79609 Pain in unspecified limb: Secondary | ICD-10-CM | POA: Insufficient documentation

## 2013-09-29 DIAGNOSIS — R079 Chest pain, unspecified: Secondary | ICD-10-CM

## 2013-09-29 DIAGNOSIS — R0789 Other chest pain: Secondary | ICD-10-CM | POA: Insufficient documentation

## 2013-09-29 DIAGNOSIS — M79604 Pain in right leg: Secondary | ICD-10-CM

## 2013-09-29 DIAGNOSIS — R209 Unspecified disturbances of skin sensation: Secondary | ICD-10-CM | POA: Insufficient documentation

## 2013-09-29 DIAGNOSIS — M79605 Pain in left leg: Secondary | ICD-10-CM

## 2013-09-29 DIAGNOSIS — R200 Anesthesia of skin: Secondary | ICD-10-CM

## 2013-09-29 DIAGNOSIS — R42 Dizziness and giddiness: Secondary | ICD-10-CM | POA: Insufficient documentation

## 2013-09-29 DIAGNOSIS — R0602 Shortness of breath: Secondary | ICD-10-CM | POA: Insufficient documentation

## 2013-09-29 DIAGNOSIS — F17200 Nicotine dependence, unspecified, uncomplicated: Secondary | ICD-10-CM | POA: Insufficient documentation

## 2013-09-29 DIAGNOSIS — I739 Peripheral vascular disease, unspecified: Secondary | ICD-10-CM

## 2013-09-29 MED ORDER — TECHNETIUM TC 99M SESTAMIBI GENERIC - CARDIOLITE
33.0000 | Freq: Once | INTRAVENOUS | Status: AC | PRN
  Administered 2013-09-29: 33 via INTRAVENOUS

## 2013-09-29 MED ORDER — TECHNETIUM TC 99M SESTAMIBI GENERIC - CARDIOLITE
11.0000 | Freq: Once | INTRAVENOUS | Status: AC | PRN
  Administered 2013-09-29: 11 via INTRAVENOUS

## 2013-09-29 MED ORDER — REGADENOSON 0.4 MG/5ML IV SOLN
0.4000 mg | Freq: Once | INTRAVENOUS | Status: AC
  Administered 2013-09-29: 0.4 mg via INTRAVENOUS

## 2013-09-29 NOTE — Progress Notes (Signed)
Lower arterial doppler complete 

## 2013-09-29 NOTE — Progress Notes (Signed)
[facility] SITE 3 NUCLEAR MED [address] [place], [place] [address] [phone]    Cardiology Nuclear Med Study  Zara ChessMichael D [name] is a 53 y.o. male     MRN : [id number]     DOB: 03/25/1961  Procedure Date: 09/29/2013  Nuclear Med Background Indication for Stress Test:  Evaluation for Ischemia, and 09-20-2013 ETT: Non-Diagnostic due to unable to reach target heartrate History:  No known CAD, GXT 4/15 submaximal Cardiac Risk Factors: Premature Family History - CAD and Smoker  Symptoms:  Chest Pain (last date of chest discomfort was last night), Dizziness, DOE and Palpitations   Nuclear Pre-Procedure Caffeine/Decaff Intake:  None > 12 hrs NPO After: 6:00pm   Lungs:  clear O2 Sat: 95% on room air. IV 0.9% NS with Angio Cath:  20g  IV Site: R Antecubital x 1, tolerated well IV Started by:  Irean HongPatsy Edwards, RN  Chest Size (in):  40 Cup Size: n/a  Height: 5\' 9"  (1.753 m)  Weight:  205 lb (92.987 kg)  BMI:  Body mass index is 30.26 kg/(m^2). Tech Comments:  N/A    Nuclear Med Study 1 or 2 day study: 1 day  Stress Test Type:  Treadmill/Lexiscan  Reading MD: N/A  Order Authorizing Provider:  Rollene RotundaJames Hochrein, MD, and Tereso NewcomerScott Weaver, PAC  Resting Radionuclide: Technetium 2634m Sestamibi  Resting Radionuclide Dose: 11.0 mCi   Stress Radionuclide:  Technetium 3134m Sestamibi  Stress Radionuclide Dose: 33.0 mCi           Stress Protocol Rest HR: 71 Stress HR: 107  Rest BP: 129/68 Stress BP: 165/78  Exercise Time (min): n/a METS: n/a           Dose of Adenosine (mg):  n/a Dose of Lexiscan: 0.4 mg  Dose of Atropine (mg): n/a Dose of Dobutamine: n/a mcg/kg/min (at max HR)  Stress Test Technologist: Nelson ChimesSharon Brooks, BS-ES  Nuclear Technologist:  Doyne Keelonya Yount, CNMT     Rest Procedure:  Myocardial perfusion imaging was performed at rest 45 minutes following the intravenous administration of Technetium 6834m Sestamibi. Rest ECG: NSR - Normal EKG  Stress Procedure:  The patient  received IV Lexiscan 0.4 mg over 15-seconds with concurrent low level exercise and then Technetium 2634m Sestamibi was injected at 30-seconds while the patient continued walking one more minute.  Quantitative spect images were obtained after a 45-minute delay.  During the infusion of Lexiscan, the patient complained of lightheadedness, cough, SOB, headache and chest discomfort.  These symptoms began to resolve in recovery.  Stress ECG: No significant change from baseline ECG  QPS Raw Data Images:  Normal; no motion artifact; normal heart/lung ratio. Stress Images:  Normal homogeneous uptake in all areas of the myocardium. Rest Images:  Normal homogeneous uptake in all areas of the myocardium. Subtraction (SDS):  No evidence of ischemia. Transient Ischemic Dilatation (Normal <1.22):  1.05 Lung/Heart Ratio (Normal <0.45):  0.36  Quantitative Gated Spect Images QGS EDV:  110 ml QGS ESV:  42 ml  Impression Exercise Capacity:  Lexiscan with low level exercise. BP Response:  Normal blood pressure response. Clinical Symptoms:  Mild chest pain/dyspnea. ECG Impression:  No significant ST segment change suggestive of ischemia. Comparison with Prior Nuclear Study: No previous nuclear study performed  Overall Impression:  Normal stress nuclear study.  LV Ejection Fraction: 62%.  LV Wall Motion:  NL LV Function; NL Wall Motion  Cassell Clementhomas Nita Whitmire  MD

## 2013-10-03 ENCOUNTER — Encounter: Payer: Self-pay | Admitting: Physician Assistant

## 2014-03-08 ENCOUNTER — Ambulatory Visit (INDEPENDENT_AMBULATORY_CARE_PROVIDER_SITE_OTHER): Payer: Medicaid Other | Admitting: Cardiology

## 2014-03-08 ENCOUNTER — Encounter: Payer: Self-pay | Admitting: Cardiology

## 2014-03-08 VITALS — BP 138/70 | HR 84 | Ht 69.0 in | Wt 214.0 lb

## 2014-03-08 DIAGNOSIS — I2589 Other forms of chronic ischemic heart disease: Secondary | ICD-10-CM

## 2014-03-08 DIAGNOSIS — I255 Ischemic cardiomyopathy: Secondary | ICD-10-CM

## 2014-03-08 NOTE — Patient Instructions (Addendum)
Your physician recommends that you schedule a follow-up appointment in: as needed  

## 2014-03-08 NOTE — Progress Notes (Signed)
HPI The patient presents for follow up of CAD.  This was described at the first office visit with him. I sent him for a POET (Plain Old Exercise Treadmill).  However, he was unable to complete this because of dyspnea and joint pains. He had a stress perfusion study which demonstrated a preserved ejection fraction with no evidence of ischemia. Since that time he has had no change in his symptoms. He gets some atypical fleeting sporadic chest discomfort. He has lots of other complaints and complains particularly of joint pains and swelling in his hands. He complains of some pain in his neck and dizziness when he turns his head in a certain direction. She's not had any palpitations, presyncope or syncope. He has chronic dyspnea but no PND or orthopnea.  Allergies  Allergen Reactions  . Asa [Aspirin]   . Tylenol [Acetaminophen]     Current Outpatient Prescriptions  Medication Sig Dispense Refill  . ALPRAZolam (XANAX) 1 MG tablet Take 1 mg by mouth.      . meloxicam (MOBIC) 7.5 MG tablet Take 7.5 mg by mouth.      Marland Kitchen omeprazole (PRILOSEC) 20 MG capsule Take 20 mg by mouth.      . Vitamin D, Ergocalciferol, (DRISDOL) 50000 UNITS CAPS capsule TAKE ONE CAPSULE BY MOUTH WEEKLY      . ALPRAZolam (XANAX) 1 MG tablet Take 1 mg by mouth at bedtime as needed for anxiety.      . ergocalciferol (VITAMIN D2) 50000 UNITS capsule Take 50,000 Units by mouth once a week.       No current facility-administered medications for this visit.    Past Medical History  Diagnosis Date  . Tobacco abuse   . Leg pain     ABIs (09/29/13):  Normal.  . Hx of cardiovascular stress test     Lexiscan Myoview (09/2013):  No ischemia, EF 62%; NORMAL    Past Surgical History  Procedure Laterality Date  . Ankle fracture surgery    . Appendectomy       ROS: As stated in the HPI and negative for all other systems.  PHYSICAL EXAM BP 138/70  Pulse 84  Ht  (1.753 m)  Wt 214 lb (97.07 kg)  BMI 31.59  kg/m2 .GENERAL:  Well appearing HEENT:  Pupils equal round and reactive, fundi not visualized, oral mucosa unremarkable NECK:  No jugular venous distention, waveform within normal limits, carotid upstroke brisk and symmetric, no bruits, no thyromegaly LYMPHATICS:  No cervical, inguinal adenopathy LUNGS:  Clear to auscultation bilaterally BACK:  No CVA tenderness CHEST:  Unremarkable HEART:  PMI not displaced or sustained,S1 and S2 within normal limits, no S3, no S4, no clicks, no rubs, no murmurs ABD:  Flat, positive bowel sounds normal in frequency in pitch, no bruits, no rebound, no guarding, no midline pulsatile mass, no hepatomegaly, no splenomegaly EXT:  2 plus pulses throughout, no edema, no cyanosis no clubbing SKIN:  No rashes no nodules NEURO:  Cranial nerves II through XII grossly intact, motor grossly intact throughout PSYCH:  Cognitively intact, oriented to person place and time  EKG:  Sinus rhythm, rate 81, axis within normal limits, intervals within normal limits, no acute ST-T wave changes.  03/08/2014   ASSESSMENT AND PLAN  CHEST PAIN:  His chest pain was atypical. Negative stress perfusion study. He's had no change in his previous symptoms. Given this no further cardiovascular testing is suggested. However, I did discuss at length with the patient and his daughter  the need for primary risk reduction. I also discussed the need to followup with Dr. Lysbeth Galas.  Certainly if he has any increasing chest discomfort he should present to the emergency room. He understands the risk of nonobstructive plaque in the future cardiovascular events with his ongoing risk factors particularly his cigarette smoking.  TOBACCO ABUSE:  We again discussed the need to stop smoking   HTN:  His blood pressure is at target.  He will continue the meds as listed.

## 2014-03-09 NOTE — Addendum Note (Signed)
Addended by: Vincenza Hews. on: 03/09/2014 12:42 PM   Modules accepted: Orders

## 2014-11-07 ENCOUNTER — Inpatient Hospital Stay (HOSPITAL_COMMUNITY): Payer: Medicaid Other

## 2014-11-07 ENCOUNTER — Emergency Department (HOSPITAL_COMMUNITY): Payer: Medicaid Other

## 2014-11-07 ENCOUNTER — Inpatient Hospital Stay (HOSPITAL_COMMUNITY)
Admission: EM | Admit: 2014-11-07 | Discharge: 2014-11-09 | DRG: 066 | Disposition: A | Discharge status: Home / Self Care | Payer: Medicaid Other | Attending: Internal Medicine | Admitting: Internal Medicine

## 2014-11-07 ENCOUNTER — Encounter (HOSPITAL_COMMUNITY): Payer: Self-pay | Admitting: Cardiology

## 2014-11-07 DIAGNOSIS — Z79899 Other long term (current) drug therapy: Secondary | ICD-10-CM

## 2014-11-07 DIAGNOSIS — F17210 Nicotine dependence, cigarettes, uncomplicated: Secondary | ICD-10-CM | POA: Diagnosis present

## 2014-11-07 DIAGNOSIS — R269 Unspecified abnormalities of gait and mobility: Secondary | ICD-10-CM | POA: Diagnosis present

## 2014-11-07 DIAGNOSIS — Z8249 Family history of ischemic heart disease and other diseases of the circulatory system: Secondary | ICD-10-CM | POA: Diagnosis not present

## 2014-11-07 DIAGNOSIS — I639 Cerebral infarction, unspecified: Secondary | ICD-10-CM

## 2014-11-07 DIAGNOSIS — Z886 Allergy status to analgesic agent status: Secondary | ICD-10-CM

## 2014-11-07 DIAGNOSIS — H532 Diplopia: Secondary | ICD-10-CM | POA: Diagnosis present

## 2014-11-07 DIAGNOSIS — I6350 Cerebral infarction due to unspecified occlusion or stenosis of unspecified cerebral artery: Secondary | ICD-10-CM | POA: Diagnosis not present

## 2014-11-07 DIAGNOSIS — E669 Obesity, unspecified: Secondary | ICD-10-CM | POA: Diagnosis present

## 2014-11-07 DIAGNOSIS — Z720 Tobacco use: Secondary | ICD-10-CM | POA: Diagnosis not present

## 2014-11-07 DIAGNOSIS — M2550 Pain in unspecified joint: Secondary | ICD-10-CM | POA: Diagnosis present

## 2014-11-07 DIAGNOSIS — I6789 Other cerebrovascular disease: Secondary | ICD-10-CM | POA: Diagnosis not present

## 2014-11-07 DIAGNOSIS — I6509 Occlusion and stenosis of unspecified vertebral artery: Secondary | ICD-10-CM

## 2014-11-07 DIAGNOSIS — H579 Unspecified disorder of eye and adnexa: Secondary | ICD-10-CM

## 2014-11-07 DIAGNOSIS — E785 Hyperlipidemia, unspecified: Secondary | ICD-10-CM | POA: Diagnosis not present

## 2014-11-07 DIAGNOSIS — Z6832 Body mass index (BMI) 32.0-32.9, adult: Secondary | ICD-10-CM | POA: Diagnosis not present

## 2014-11-07 HISTORY — DX: Gastric ulcer, unspecified as acute or chronic, without hemorrhage or perforation: K25.9

## 2014-11-07 LAB — CBC WITH DIFFERENTIAL/PLATELET
Basophils Absolute: 0 10*3/uL (ref 0.0–0.1)
Basophils Relative: 0 % (ref 0–1)
EOS ABS: 0.2 10*3/uL (ref 0.0–0.7)
EOS PCT: 2 % (ref 0–5)
HCT: 49 % (ref 39.0–52.0)
HEMOGLOBIN: 16.3 g/dL (ref 13.0–17.0)
Lymphocytes Relative: 22 % (ref 12–46)
Lymphs Abs: 1.7 10*3/uL (ref 0.7–4.0)
MCH: 30 pg (ref 26.0–34.0)
MCHC: 33.3 g/dL (ref 30.0–36.0)
MCV: 90.1 fL (ref 78.0–100.0)
MONO ABS: 0.6 10*3/uL (ref 0.1–1.0)
Monocytes Relative: 8 % (ref 3–12)
Neutro Abs: 5.4 10*3/uL (ref 1.7–7.7)
Neutrophils Relative %: 68 % (ref 43–77)
Platelets: 202 10*3/uL (ref 150–400)
RBC: 5.44 MIL/uL (ref 4.22–5.81)
RDW: 12.8 % (ref 11.5–15.5)
WBC: 7.9 10*3/uL (ref 4.0–10.5)

## 2014-11-07 LAB — URINALYSIS, ROUTINE W REFLEX MICROSCOPIC
BILIRUBIN URINE: NEGATIVE
Glucose, UA: NEGATIVE mg/dL
Hgb urine dipstick: NEGATIVE
KETONES UR: NEGATIVE mg/dL
Leukocytes, UA: NEGATIVE
Nitrite: NEGATIVE
PH: 5.5 (ref 5.0–8.0)
PROTEIN: NEGATIVE mg/dL
Specific Gravity, Urine: 1.015 (ref 1.005–1.030)
UROBILINOGEN UA: 0.2 mg/dL (ref 0.0–1.0)

## 2014-11-07 LAB — BASIC METABOLIC PANEL
Anion gap: 5 (ref 5–15)
BUN: 12 mg/dL (ref 6–20)
CO2: 27 mmol/L (ref 22–32)
Calcium: 8.8 mg/dL — ABNORMAL LOW (ref 8.9–10.3)
Chloride: 105 mmol/L (ref 101–111)
Creatinine, Ser: 0.72 mg/dL (ref 0.61–1.24)
Glucose, Bld: 96 mg/dL (ref 65–99)
POTASSIUM: 4.2 mmol/L (ref 3.5–5.1)
Sodium: 137 mmol/L (ref 135–145)

## 2014-11-07 LAB — TROPONIN I

## 2014-11-07 LAB — CBG MONITORING, ED: Glucose-Capillary: 95 mg/dL (ref 65–99)

## 2014-11-07 MED ORDER — ASPIRIN 300 MG RE SUPP: 300.0000 mg | Freq: Every day | RECTAL | Status: DC

## 2014-11-07 MED ORDER — LORAZEPAM 2 MG/ML IJ SOLN
INTRAMUSCULAR | Status: AC
  Filled 2014-11-07: qty 1

## 2014-11-07 MED ORDER — GADOBENATE DIMEGLUMINE 529 MG/ML IV SOLN
20.0000 mL | Freq: Once | INTRAVENOUS | Status: AC | PRN
  Administered 2014-11-07: 20 mL via INTRAVENOUS

## 2014-11-07 MED ORDER — STROKE: EARLY STAGES OF RECOVERY BOOK
Freq: Once | Status: AC
  Administered 2014-11-08: 01:00:00
  Filled 2014-11-07: qty 1

## 2014-11-07 MED ORDER — HEPARIN SODIUM (PORCINE) 5000 UNIT/ML IJ SOLN
5000.0000 [IU] | Freq: Three times a day (TID) | INTRAMUSCULAR | Status: DC
  Administered 2014-11-08 – 2014-11-09 (×6): 5000 [IU] via SUBCUTANEOUS
  Filled 2014-11-07 (×6): qty 1

## 2014-11-07 MED ORDER — GABAPENTIN 100 MG PO CAPS
100.0000 mg | ORAL_CAPSULE | Freq: Three times a day (TID) | ORAL | Status: DC
  Administered 2014-11-08 – 2014-11-09 (×5): 100 mg via ORAL
  Filled 2014-11-07 (×6): qty 1

## 2014-11-07 MED ORDER — ASPIRIN 325 MG PO TABS: 325.0000 mg | ORAL_TABLET | Freq: Every day | ORAL | Status: DC

## 2014-11-07 MED ORDER — ALPRAZOLAM 0.5 MG PO TABS
1.0000 mg | ORAL_TABLET | Freq: Three times a day (TID) | ORAL | Status: DC | PRN
  Administered 2014-11-08 (×2): 1 mg via ORAL
  Filled 2014-11-07 (×2): qty 2

## 2014-11-07 MED ORDER — ASPIRIN 81 MG PO CHEW
324.0000 mg | CHEWABLE_TABLET | Freq: Once | ORAL | Status: AC
  Administered 2014-11-07: 324 mg via ORAL
  Filled 2014-11-07: qty 4

## 2014-11-07 MED ORDER — PANTOPRAZOLE SODIUM 40 MG PO TBEC
40.0000 mg | DELAYED_RELEASE_TABLET | Freq: Every day | ORAL | Status: DC
  Administered 2014-11-08 – 2014-11-09 (×3): 40 mg via ORAL
  Filled 2014-11-07 (×3): qty 1

## 2014-11-07 MED ORDER — LORAZEPAM 2 MG/ML IJ SOLN
2.0000 mg | Freq: Once | INTRAMUSCULAR | Status: AC
  Administered 2014-11-07: 2 mg via INTRAVENOUS

## 2014-11-07 MED ORDER — VITAMIN D (ERGOCALCIFEROL) 1.25 MG (50000 UNIT) PO CAPS: 50000.0000 [IU] | ORAL_CAPSULE | ORAL | Status: DC

## 2014-11-07 MED ORDER — SENNOSIDES-DOCUSATE SODIUM 8.6-50 MG PO TABS: 1.0000 | ORAL_TABLET | Freq: Every evening | ORAL | Status: DC | PRN

## 2014-11-07 NOTE — H&P (Signed)
[facility] History and Physical  TAIJUAN SERVISS XBJ:[id number] DOB: [birth year] DOA: 11/07/2014  Referring physician: ER, Dr. Denton Lank PCP: Josue Hector, MD   Chief Complaint: Dizziness  HPI: AMANTE FOMBY is a 54 y.o. male  This is a 54 year old man who gives a 4-5 day history of dizziness. He describes everything moving but also lightheadedness as if he was going to faint. This started around and we over the last few days. He has noticed that his gait is not stable. He has also noticed problems with speech in that he would say a word to 3 times. He denies any dysarthria. He describes diplopia also. No headache or loss of consciousness. No fevers. No chest pain, palpitations or dyspnea. He has not had any vomiting. Evaluation in the emergency room with MRI brain scan shows the presence of scattered areas of acute infarct in the right superior cerebellum and also small areas of acute infarction in the pons bilaterally and left thalamus. He is now being admitted for further investigation and management.   Review of Systems:  Apart from symptoms above, all systems negative.  Past Medical History  Diagnosis Date  . Tobacco abuse   . Leg pain     ABIs (09/29/13):  Normal.  . Hx of cardiovascular stress test     Lexiscan Myoview (09/2013):  No ischemia, EF 62%; NORMAL  . Stomach ulcer    Past Surgical History  Procedure Laterality Date  . Ankle fracture surgery    . Appendectomy     Social History:  reports that he has been smoking Cigarettes.  He has a 35 pack-year smoking history. He does not have any smokeless tobacco history on file. He reports that he drinks alcohol. He reports that he does not use illicit drugs.  Allergies  Allergen Reactions  . Asa [Aspirin]   . Tylenol [Acetaminophen]     Family History  Problem Relation Age of Onset  . CAD Father 23    Died of MI  . CAD Paternal Grandfather 38    Died of MI  . Heart failure Mother 10    Alive  . Heart  disease Daughter     Rapid heart rate    Prior to Admission medications   Medication Sig Start Date End Date Taking? Authorizing Provider  ALPRAZolam Prudy Feeler) 1 MG tablet Take 1 mg by mouth 3 (three) times daily as needed for anxiety.    Yes Historical Provider, MD  gabapentin (NEURONTIN) 100 MG capsule TAKE 1 CAPSULE (100 MG TOTAL) BY MOUTH 3 (THREE) TIMES A DAY. 11/03/14  Yes Historical Provider, MD  meloxicam (MOBIC) 15 MG tablet Take 15 mg by mouth. 08/03/14  Yes Historical Provider, MD  omeprazole (PRILOSEC) 20 MG capsule Take 20 mg by mouth. 03/06/14 03/06/15 Yes Historical Provider, MD  Vitamin D, Ergocalciferol, (DRISDOL) 50000 UNITS CAPS capsule TAKE ONE CAPSULE BY MOUTH WEEKLY 01/31/14  Yes Historical Provider, MD   Physical Exam: Filed Vitals:   11/07/14 1843 11/07/14 2000 11/07/14 2100 11/07/14 2137  BP: 151/89 125/73 146/65 151/80  Pulse: 75 70 80 76  Temp:      TempSrc:      Resp: 16   20  Height:      Weight:      SpO2: 95% 95% 97% 95%    Wt Readings from Last 3 Encounters:  11/07/14 99.791 kg (220 lb)  11/07/14 99.791 kg (220 lb)  11/07/14 99.791 kg (220 lb)    General:  Appears somewhat  confused. He wonders in the conversation and finds it hard to stick to the subject. Eyes: PERRL, normal lids, irises & conjunctiva ENT: grossly normal hearing, lips & tongue Neck: no LAD, masses or thyromegaly Cardiovascular: RRR, no m/r/g. No LE edema. Telemetry: SR, no arrhythmias  Respiratory: CTA bilaterally, no w/r/r. Normal respiratory effort. Abdomen: soft, ntnd Skin: no rash or induration seen on limited exam Musculoskeletal: grossly normal tone BUE/BLE Psychiatric: grossly normal mood and affect, speech fluent and appropriate Neurologic: He does not appear to have focal limb weakness. He does appear to have cerebellar signs affecting the right side especially with clumsy finger to nose test and dysdiadochokinesis on the right side. His heel-to-shin test is also clumsy on  the right side. There is no nystagmus. There is no facial asymmetry.           Labs on Admission:  Basic Metabolic Panel:  Recent Labs Lab 11/07/14 1358  NA 137  K 4.2  CL 105  CO2 27  GLUCOSE 96  BUN 12  CREATININE 0.72  CALCIUM 8.8*   Liver Function Tests: No results for input(s): AST, ALT, ALKPHOS, BILITOT, PROT, ALBUMIN in the last 168 hours. No results for input(s): LIPASE, AMYLASE in the last 168 hours. No results for input(s): AMMONIA in the last 168 hours. CBC:  Recent Labs Lab 11/07/14 1358  WBC 7.9  NEUTROABS 5.4  HGB 16.3  HCT 49.0  MCV 90.1  PLT 202   Cardiac Enzymes:  Recent Labs Lab 11/07/14 1358  TROPONINI <0.03    BNP (last 3 results) No results for input(s): BNP in the last 8760 hours.  ProBNP (last 3 results) No results for input(s): PROBNP in the last 8760 hours.  CBG:  Recent Labs Lab 11/07/14 1342  GLUCAP 95    Radiological Exams on Admission: Ct Head Wo Contrast  11/07/2014   CLINICAL DATA:  Syncopal episodes over the past several days. Dizziness. Remote history of head injury. Initial encounter.  EXAM: CT HEAD WITHOUT CONTRAST  TECHNIQUE: Contiguous axial images were obtained from the base of the skull through the vertex without intravenous contrast.  COMPARISON:  None.  FINDINGS: The patient was unable to remain motionless for the exam. Small or subtle lesions could be overlooked.  Multiple (at least 4) areas of cytotoxic edema affect the RIGHT lateral and superior cerebellum, without associated hemorrhage. No abnormalities on the LEFT or within the supratentorial compartment (specifically elsewhere in the posterior circulation). The largest area of edema measures up to 1.5 cm in diameter; these are consistent with multiple thrombotic or embolic infarcts, probably within the RIGHT SCA or possibly AICA vascular distribution.  Elsewhere no areas of suspected acute infarction, hemorrhage, mass lesion, hydrocephalus, or extra-axial fluid.   Calvarium intact.  Extracranial soft tissues unremarkable.  No features to suggest large vessel occlusion of the RIGHT vertebral or basilar artery. No posterior fossa mass is evident. No tonsillar herniation.  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 RIGHT vertebral and/or basilar dissection should be excluded. MRI brain with MRA intracranial and extracranial circulation is recommended for further evaluation, if no contraindications.  Findings discussed with EDP.   Electronically Signed   By: Davonna BellingJohn  Curnes M.D.   On: 11/07/2014 16:17   Mr Maxine GlennMra Head Wo Contrast  11/07/2014   CLINICAL DATA:  Visual change.  Stroke.  EXAM: MRA HEAD WITHOUT CONTRAST  TECHNIQUE: Angiographic images of the Circle of Willis were obtained using MRA technique without intravenous contrast.  COMPARISON:  MRI head  11/07/2014  FINDINGS: Left vertebral artery is occluded proximally with reconstitution from PICA. Right vertebral and right PICA widely patent. Basilar widely patent. Fetal origin of the left posterior cerebral artery. Right posterior communicating artery patent. Both posterior cerebral arteries are patent. Proximal superior cerebellar artery patent bilaterally. Distal superior cerebral artery difficult to visualize due to small size. This patient has acute right superior cerebellar artery stroke.  Internal carotid artery normal bilaterally. 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 Otherwise negative.   Electronically Signed   By: Marlan Palau M.D.   On: 11/07/2014 19:15   Mr Angiogram Neck W Wo Contrast  11/07/2014   CLINICAL DATA:  Stroke.  Visual change  EXAM: MRA NECK WITHOUT AND WITH CONTRAST  TECHNIQUE: Multiplanar and multiecho pulse sequences of the neck were obtained without and with intravenous contrast. Angiographic images of the neck were obtained  using MRA technique without and with intravenous contrast.  CONTRAST:  20mL MULTIHANCE GADOBENATE DIMEGLUMINE 529 MG/ML IV SOLN  COMPARISON:  MRI head today  FINDINGS: Occlusion of the proximal left vertebral artery which remains occluded throughout the neck with reconstitution via PICA distally.  Right vertebral artery widely patent. Both carotid arteries are normal without stenosis.  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 There is reconstitution distally via left PICA collaterals.  Normal right vertebral.  Normal carotid artery bilaterally.   Electronically Signed   By: Marlan Palau M.D.   On: 11/07/2014 19:18   Mr Brain Wo Contrast  11/07/2014   CLINICAL DATA:  Visual symptoms.  Abnormal CT.  EXAM: MRI HEAD WITHOUT CONTRAST  TECHNIQUE: Multiplanar, multiecho pulse sequences of the brain and surrounding structures were obtained without intravenous contrast.  COMPARISON:  CT head 11/07/2014  FINDINGS: Acute infarction right superior cerebellum. Small areas of acute infarct in the pons bilaterally. Small infarct left thalamus. Negative for hemorrhage  Ventricle size normal.  No significant chronic ischemia  Negative for mass or edema.  Mild mucosal edema paranasal sinuses.  Pituitary normal in size.  IMPRESSION: Scattered areas of acute infarct right superior cerebellum. Small areas of acute infarct in the pons bilaterally and left thalamus.   Electronically Signed   By: Marlan Palau M.D.   On: 11/07/2014 19:11    EKG: Independently reviewed. Normal sinus rhythm without any acute ST-T wave changes.  Assessment/Plan   1. Posterior cerebral circulation CVA. 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 Neurology, Dr. Thad Ranger, has orally been consulted about this patient and aspirin is recommended. The patient will be transferred to [facility] this evening for further investigations, including echocardiogram  and carotid Dopplers.. 2. Ongoing tobacco abuse.  Further recommendations will depend patient's hospital progress  Code Status: Full code.  DVT Prophylaxis: Heparin.  Family Communication: I discussed the plan with the patient at the bedside.   Disposition Plan: Home in medically stable.  Time spent: 60 minutes.  Wilson Singer [facility] Pager [phone].

## 2014-11-07 NOTE — ED Notes (Signed)
4 day history of intermittently blurred vision.  No central or peripheral vision loss, no floaters or flashes.  Denies eye pain or drainage, but states "I have a hard time keeping my eyes open."  Denies HTN, cardiac history, CP, SOB, dizziness, vertigo, n/v. Reports recent "ear problem" that has since resolved.  Strong family hx of diabetes.  CBG is 95. Reports recent hx of polyuria, polyphagia and polydipsia.  States he's had L anterior cervical lymph node tenderness, but no swelling noted.  Denies any recent illness.

## 2014-11-07 NOTE — ED Notes (Signed)
Patient stated that he was slightly dizzy upon sitting and standing.

## 2014-11-07 NOTE — ED Provider Notes (Signed)
CSN: [id number]     Arrival date & time 11/07/14  1258 History   First MD Initiated Contact with Patient 11/07/14 1338     Chief Complaint  Patient presents with  . Loss of Consciousness     (Consider location/radiation/quality/duration/timing/severity/associated sxs/prior Treatment) Patient is a 54 y.o. male presenting with syncope. The history is provided by the patient.  Loss of Consciousness Associated symptoms: dizziness and weakness   Associated symptoms: no chest pain, no confusion, no diaphoresis, no fever, no headaches, no palpitations, no shortness of breath and no vomiting   Patient c/o 'blacking out' for the past 4-5 days. States recurrently feeling lightheaded, as if might faint, at home earlier today. States has occurred randomly at other times in the past few days. States can occur when sitting or standing. States at times will feel tired, as if hard to keep eyes open. At other times has experienced tunnel vision. Felt dizzy earlier, although denies vertigo/dizziness currently. No hearing loss or tinnitus. Pt denies palpitations or sense of rapid or irregular heartbeat. No syncope. No hx dysrhythmia. No hx cad. No current or recent cp or discomfort. No unusual doe or fatigue. Pt denies headaches. No focalnumbness/weakness, or change in speech or vision. States has felt generally shaky when up. Denies any recent change in meds, compliant w normal meds. No recent blood loss, no rectal bleeding or melena. No cough or uri c/o. No fever or chills. Had eaten today earlier/normal appetite, no recent wt gain or loss.       Past Medical History  Diagnosis Date  . Tobacco abuse   . Leg pain     ABIs (09/29/13):  Normal.  . Hx of cardiovascular stress test     Lexiscan Myoview (09/2013):  No ischemia, EF 62%; NORMAL  . Stomach ulcer    Past Surgical History  Procedure Laterality Date  . Ankle fracture surgery    . Appendectomy     Family History  Problem Relation Age of Onset  .  CAD Father 6970    Died of MI  . CAD Paternal Grandfather [id number]    Died of MI  . Heart failure Mother 865    Alive  . Heart disease Daughter     Rapid heart rate   History  Substance Use Topics  . Smoking status: Current Every Day Smoker -- 1.00 packs/day for 35 years    Types: Cigarettes  . Smokeless tobacco: Not on file  . Alcohol Use: Yes     Comment: Occassional Use    Review of Systems  Constitutional: Negative for fever, chills, diaphoresis and unexpected weight change.  HENT: Negative for hearing loss, sore throat and tinnitus.   Eyes: Positive for visual disturbance. Negative for pain.  Respiratory: Negative for cough and shortness of breath.   Cardiovascular: Positive for syncope. Negative for chest pain, palpitations and leg swelling.  Gastrointestinal: Negative for vomiting, abdominal pain, constipation and blood in stool.  Endocrine: Negative for polyuria.  Genitourinary: Negative for dysuria and flank pain.  Musculoskeletal: Negative for back pain and neck pain.  Skin: Negative for rash.  Neurological: Positive for dizziness, weakness and light-headedness. Negative for syncope, speech difficulty and headaches.  Hematological: Does not bruise/bleed easily.  Psychiatric/Behavioral: Negative for confusion.      Allergies  Asa and Tylenol  Home Medications   Prior to Admission medications   Medication Sig Start Date End Date Taking? Authorizing Provider  ALPRAZolam Prudy Feeler(XANAX) 1 MG tablet Take 1 mg by mouth at  bedtime as needed for anxiety.    Historical Provider, MD  ALPRAZolam Prudy Feeler) 1 MG tablet Take 1 mg by mouth. 01/31/14   Historical Provider, MD  ergocalciferol (VITAMIN D2) 50000 UNITS capsule Take 50,000 Units by mouth once a week.    Historical Provider, MD  omeprazole (PRILOSEC) 20 MG capsule Take 20 mg by mouth. 03/06/14 03/06/15  Historical Provider, MD  Vitamin D, Ergocalciferol, (DRISDOL) 50000 UNITS CAPS capsule TAKE ONE CAPSULE BY MOUTH WEEKLY 01/31/14    Historical Provider, MD   BP 143/66 mmHg  Pulse 63  Temp(Src) 97.7 F (36.5 C) (Oral)  Resp 18  Ht  (1.753 m)  Wt 220 lb (99.791 kg)  BMI 32.47 kg/m2  SpO2 97% Physical Exam  Constitutional: He is oriented to person, place, and time. He appears well-developed and well-nourished. No distress.  HENT:  Head: Atraumatic.  Mouth/Throat: Oropharynx is clear and moist.  TMs normal.   Eyes: Conjunctivae and EOM are normal. Pupils are equal, round, and reactive to light. No scleral icterus.  Neck: Normal range of motion. Neck supple. No tracheal deviation present.  No bruits  Cardiovascular: Normal rate, regular rhythm, normal heart sounds and intact distal pulses.  Exam reveals no gallop and no friction rub.   No murmur heard. Pulmonary/Chest: Effort normal and breath sounds normal. No accessory muscle usage. No respiratory distress.  Abdominal: Soft. Bowel sounds are normal. He exhibits no distension. There is no tenderness.  Genitourinary:  No cva tenderness  Musculoskeletal: Normal range of motion. He exhibits no edema or tenderness.  Neurological: He is alert and oriented to person, place, and time. No cranial nerve deficit.  Motor intact bil. stre 5/5. sens grossly intact. Mildly unsteady gait. No pronator drift.   Skin: Skin is warm and dry. He is not diaphoretic.  Psychiatric: He has a normal mood and affect.  Nursing note and vitals reviewed.   ED Course  Procedures (including critical care time) Labs Review   Results for orders placed or performed during the hospital encounter of 11/07/14  CBC with Differential  Result Value Ref Range   WBC 7.9 4.0 - 10.5 K/uL   RBC 5.44 4.22 - 5.81 MIL/uL   Hemoglobin 16.3 13.0 - 17.0 g/dL   HCT 81.1 91.4 - 78.2 %   MCV 90.1 78.0 - 100.0 fL   MCH 30.0 26.0 - 34.0 pg   MCHC 33.3 30.0 - 36.0 g/dL   RDW 95.6 21.3 - 08.6 %   Platelets 202 150 - 400 K/uL   Neutrophils Relative % 68 43 - 77 %   Neutro Abs 5.4 1.7 - 7.7 K/uL    Lymphocytes Relative 22 12 - 46 %   Lymphs Abs 1.7 0.7 - 4.0 K/uL   Monocytes Relative 8 3 - 12 %   Monocytes Absolute 0.6 0.1 - 1.0 K/uL   Eosinophils Relative 2 0 - 5 %   Eosinophils Absolute 0.2 0.0 - 0.7 K/uL   Basophils Relative 0 0 - 1 %   Basophils Absolute 0.0 0.0 - 0.1 K/uL  Basic metabolic panel  Result Value Ref Range   Sodium 137 135 - 145 mmol/L   Potassium 4.2 3.5 - 5.1 mmol/L   Chloride 105 101 - 111 mmol/L   CO2 27 22 - 32 mmol/L   Glucose, Bld 96 65 - 99 mg/dL   BUN 12 6 - 20 mg/dL   Creatinine, Ser 5.78 0.61 - 1.24 mg/dL   Calcium 8.8 (L) 8.9 - 10.3 mg/dL  GFR calc non Af Amer >60 >60 mL/min   GFR calc Af Amer >60 >60 mL/min   Anion gap 5 5 - 15  Troponin I  Result Value Ref Range   Troponin I <0.03 <0.031 ng/mL  Urinalysis, Routine w reflex microscopic  Result Value Ref Range   Color, Urine YELLOW YELLOW   APPearance CLEAR CLEAR   Specific Gravity, Urine 1.015 1.005 - 1.030   pH 5.5 5.0 - 8.0   Glucose, UA NEGATIVE NEGATIVE mg/dL   Hgb urine dipstick NEGATIVE NEGATIVE   Bilirubin Urine NEGATIVE NEGATIVE   Ketones, ur NEGATIVE NEGATIVE mg/dL   Protein, ur NEGATIVE NEGATIVE mg/dL   Urobilinogen, UA 0.2 0.0 - 1.0 mg/dL   Nitrite NEGATIVE NEGATIVE   Leukocytes, UA NEGATIVE NEGATIVE  CBG monitoring, ED  Result Value Ref Range   Glucose-Capillary 95 65 - 99 mg/dL   Ct Head Wo Contrast  11/07/2014   CLINICAL DATA:  Syncopal episodes over the past several days. Dizziness. Remote history of head injury. Initial encounter.  EXAM: CT HEAD WITHOUT CONTRAST  TECHNIQUE: Contiguous axial images were obtained from the base of the skull through the vertex without intravenous contrast.  COMPARISON:  None.  FINDINGS: The patient was unable to remain motionless for the exam. Small or subtle lesions could be overlooked.  Multiple (at least 4) areas of cytotoxic edema affect the RIGHT lateral and superior cerebellum, without associated hemorrhage. No abnormalities on the LEFT  or within the supratentorial compartment (specifically elsewhere in the posterior circulation). The largest area of edema measures up to 1.5 cm in diameter; these are consistent with multiple thrombotic or embolic infarcts, probably within the RIGHT SCA or possibly AICA vascular distribution.  Elsewhere no areas of suspected acute infarction, hemorrhage, mass lesion, hydrocephalus, or extra-axial fluid.  Calvarium intact.  Extracranial soft tissues unremarkable.  No features to suggest large vessel occlusion of the RIGHT vertebral or basilar artery. No posterior fossa mass is evident. No tonsillar herniation.  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 RIGHT vertebral and/or basilar dissection should be excluded. MRI brain with MRA intracranial and extracranial circulation is recommended for further evaluation, if no contraindications.  Findings discussed with EDP.   Electronically Signed   By: Davonna Belling M.D.   On: 11/07/2014 16:17   Mr Maxine Glenn Head Wo Contrast  11/07/2014   CLINICAL DATA:  Visual change.  Stroke.  EXAM: MRA HEAD WITHOUT CONTRAST  TECHNIQUE: Angiographic images of the Circle of Willis were obtained using MRA technique without intravenous contrast.  COMPARISON:  MRI head 11/07/2014  FINDINGS: Left vertebral artery is occluded proximally with reconstitution from PICA. Right vertebral and right PICA widely patent. Basilar widely patent. Fetal origin of the left posterior cerebral artery. Right posterior communicating artery patent. Both posterior cerebral arteries are patent. Proximal superior cerebellar artery patent bilaterally. Distal superior cerebral artery difficult to visualize due to small size. This patient has acute right superior cerebellar artery stroke.  Internal carotid artery normal bilaterally. Anterior and middle cerebral arteries normal  Negative for cerebral aneurysm  IMPRESSION: Occlusion left vertebral artery proximally with reconstitution via  PICA collaterals. Otherwise negative.   Electronically Signed   By: Marlan Palau M.D.   On: 11/07/2014 19:15   Mr Angiogram Neck W Wo Contrast  11/07/2014   CLINICAL DATA:  Stroke.  Visual change  EXAM: MRA NECK WITHOUT AND WITH CONTRAST  TECHNIQUE: Multiplanar and multiecho pulse sequences of the neck were obtained without and with intravenous contrast. Angiographic  images of the neck were obtained using MRA technique without and with intravenous contrast.  CONTRAST:  20mL MULTIHANCE GADOBENATE DIMEGLUMINE 529 MG/ML IV SOLN  COMPARISON:  MRI head today  FINDINGS: Occlusion of the proximal left vertebral artery which remains occluded throughout the neck with reconstitution via PICA distally.  Right vertebral artery widely patent. Both carotid arteries are normal without stenosis.  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 There is reconstitution distally via left PICA collaterals.  Normal right vertebral.  Normal carotid artery bilaterally.   Electronically Signed   By: Marlan Palau M.D.   On: 11/07/2014 19:18   Mr Brain Wo Contrast  11/07/2014   CLINICAL DATA:  Visual symptoms.  Abnormal CT.  EXAM: MRI HEAD WITHOUT CONTRAST  TECHNIQUE: Multiplanar, multiecho pulse sequences of the brain and surrounding structures were obtained without intravenous contrast.  COMPARISON:  CT head 11/07/2014  FINDINGS: Acute infarction right superior cerebellum. Small areas of acute infarct in the pons bilaterally. Small infarct left thalamus. Negative for hemorrhage  Ventricle size normal.  No significant chronic ischemia  Negative for mass or edema.  Mild mucosal edema paranasal sinuses.  Pituitary normal in size.  IMPRESSION: Scattered areas of acute infarct right superior cerebellum. Small areas of acute infarct in the pons bilaterally and left thalamus.   Electronically Signed   By: Marlan Palau M.D.   On: 11/07/2014 19:11       EKG Interpretation   Date/Time:  Tuesday Nov 07 2014 13:05:02 EDT Ventricular Rate:  61 PR Interval:  188 QRS Duration: 74 QT Interval:  368 QTC Calculation: 370 R Axis:   57 Text Interpretation:  Normal sinus rhythm No significant change since last  tracing Confirmed by Denton Lank  MD, Caryn Bee ([id number]) on 11/07/2014 1:38:50 PM      MDM   Labs. Monitor.   Reviewed nursing notes and prior charts for additional history.   mra resulted.  Discussed pt with Dr Jodi Mourning, neurohospitalists at [facility], who reviewed MR results - she indicates give asa, no other anticoag or tx for now, transfer to [facility], they will see in consult, and requests hospitalist admit.  Discussed with hospitalist - will facilitate transfer/admit to [facility].  Recheck pt, no change in neuro exam. No headache. No fevers.   Discussed plan/mr w pt, pt agreeable w plan.   Asa po (pt indicates his allergy is gi upset, no anaphylaxis)    Cathren Laine, MD 11/07/14 1958

## 2014-11-07 NOTE — ED Notes (Signed)
Patient unable to tolerate completing MRI/MRA. MRI Tech notified Dr. Steinl. OrdeDenton Lankr received for 2mg  IV Ativan.

## 2014-11-07 NOTE — ED Notes (Signed)
Patient returned to MRI

## 2014-11-07 NOTE — ED Notes (Signed)
Patient denies any loss of consciousness or hitting head in recent past.

## 2014-11-07 NOTE — ED Notes (Signed)
Been "blacking out" last 4 or 5 days.

## 2014-11-07 NOTE — ED Notes (Signed)
[facility] here to transport pt 

## 2014-11-07 NOTE — Discharge Instructions (Signed)
[facility] - page admitting hospitalists team on pts arrival to bed/floor.

## 2014-11-08 ENCOUNTER — Inpatient Hospital Stay (HOSPITAL_COMMUNITY): Payer: Medicaid Other

## 2014-11-08 DIAGNOSIS — I6350 Cerebral infarction due to unspecified occlusion or stenosis of unspecified cerebral artery: Secondary | ICD-10-CM

## 2014-11-08 DIAGNOSIS — I6789 Other cerebrovascular disease: Secondary | ICD-10-CM

## 2014-11-08 DIAGNOSIS — Z720 Tobacco use: Secondary | ICD-10-CM

## 2014-11-08 LAB — VITAMIN B12: Vitamin B-12: 420 pg/mL (ref 180–914)

## 2014-11-08 LAB — TSH: TSH: 1.843 u[IU]/mL (ref 0.350–4.500)

## 2014-11-08 LAB — URIC ACID: Uric Acid, Serum: 3.1 mg/dL — ABNORMAL LOW (ref 4.4–7.6)

## 2014-11-08 MED ORDER — ASPIRIN EC 325 MG PO TBEC
325.0000 mg | DELAYED_RELEASE_TABLET | Freq: Every day | ORAL | Status: DC
  Administered 2014-11-08 – 2014-11-09 (×2): 325 mg via ORAL
  Filled 2014-11-08 (×2): qty 1

## 2014-11-08 MED ORDER — ASPIRIN 300 MG RE SUPP: 300.0000 mg | Freq: Every day | RECTAL | Status: DC

## 2014-11-08 NOTE — Progress Notes (Addendum)
STROKE TEAM PROGRESS NOTE   HISTORY Ian [name] is an 54 y.o. male presenting with 4 to 5 day history of dizziness. Describes it as a vertigo/moving type sensation and associated lightheadedness. Feels unsteady on his feet. Notes double vision and some trouble with his speech. 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 Denies any recent trauma to neck, no visit to chiropractor. Does note some shoulder and neck pain starting around 1 week ago. No prior CVA or TIA history. He was last known well 11/04/2014 at 0800. Patient was not administered TPA secondary to being outside tPA window. He was admitted for further evaluation and treatment.   SUBJECTIVE (INTERVAL HISTORY) No family is at the bedside.  Overall he feels his condition is much improved. He does report a history of palpitations, dizziness. Also complains RLE pain, unable to touch.    OBJECTIVE Temp:  [97.4 F (36.3 C)-98.5 F (36.9 C)] 97.4 F (36.3 C) (05/26 1720) Pulse Rate:  [59-72] 71 (05/26 1720) Cardiac Rhythm:  [-]  Resp:  [18] 18 (05/26 1720) BP: (124-152)/(60-79) 152/74 mmHg (05/26 1720) SpO2:  [95 %-97 %] 95 % (05/26 1720)   Recent Labs Lab 11/07/14 1342  GLUCAP 95    Recent Labs Lab 11/07/14 1358  NA 137  K 4.2  CL 105  CO2 27  GLUCOSE 96  BUN 12  CREATININE 0.72  CALCIUM 8.8*   No results for input(s): AST, ALT, ALKPHOS, BILITOT, PROT, ALBUMIN in the last 168 hours.  Recent Labs Lab 11/07/14 1358  WBC 7.9  NEUTROABS 5.4  HGB 16.3  HCT 49.0  MCV 90.1  PLT 202    Recent Labs Lab 11/07/14 1358  TROPONINI <0.03   No results for input(s): LABPROT, INR in the last 72 hours.  Recent Labs  11/07/14 1406  COLORURINE YELLOW  LABSPEC 1.015  PHURINE 5.5  GLUCOSEU NEGATIVE  HGBUR NEGATIVE  BILIRUBINUR NEGATIVE  KETONESUR NEGATIVE  PROTEINUR NEGATIVE   UROBILINOGEN 0.2  NITRITE NEGATIVE  LEUKOCYTESUR NEGATIVE       Component Value Date/Time   CHOL 204* 11/09/2014 1438   TRIG 661* 11/09/2014 1438   HDL 28* 11/09/2014 1438   CHOLHDL 7.3 11/09/2014 1438   VLDL UNABLE TO CALCULATE IF TRIGLYCERIDE OVER 400 mg/dL 40/98/119105/26/2016 [id number]   LDLCALC UNABLE TO CALCULATE IF TRIGLYCERIDE OVER 400 mg/dL 95/62/130805/26/2016 [id number]   No results found for: HGBA1C No results found for: LABOPIA, COCAINSCRNUR, LABBENZ, AMPHETMU, THCU, LABBARB  No results for input(s): ETH in the last 168 hours.  I have personally reviewed the radiological images below and agree with the radiology interpretations.  Dg Chest 2 View 11/07/2014    No active cardiopulmonary disease.     Ct Head Wo Contrast 11/07/2014    Acute nonhemorrhagic RIGHT cerebellar infarctions as described. RIGHT vertebral and/or basilar dissection should be excluded. MRI brain with MRA intracranial and extracranial circulation is recommended for further evaluation, if no contraindications.    Mr Brain Wo Contrast 11/07/2014    Scattered areas of acute infarct right superior cerebellum. Small areas of acute infarct in the pons bilaterally and left thalamus.     Mr Maxine GlennMra Head Wo Contrast 11/07/2014    Occlusion left vertebral artery proximally with reconstitution via PICA collaterals. Otherwise negative.     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 There is reconstitution distally via  left PICA collaterals.  Normal right vertebral.  Normal carotid artery bilaterally.    2D Echocardiogram  - Left ventricle: Technically difficult study. The cavity size wasnormal. Wall thickness was increased in a pattern of mild LVH.The estimated ejection fraction was 60%. Wall motion was normal;there were no regional wall motion abnormalities. - Right ventricle: The cavity size was normal. Systolic functionwas normal. Impressions: No cardiac source of embolism was  identified, butcannot be ruled out on the basis of this examination.  LE Venous doppler - negative for DVT  PHYSICAL EXAM  Temp:  [97.4 F (36.3 C)-98.5 F (36.9 C)] 97.4 F (36.3 C) (05/26 1720) Pulse Rate:  [59-72] 71 (05/26 1720) Resp:  [18] 18 (05/26 1720) BP: (124-152)/(60-79) 152/74 mmHg (05/26 1720) SpO2:  [95 %-97 %] 95 % (05/26 1720)  General - Well nourished, well developed, in no apparent distress.  Ophthalmologic - Sharp disc margins OU.  Cardiovascular - Regular rate and rhythm with no murmur.  Mental Status -  Level of arousal and orientation to time, place, and person were intact. Language including expression, naming, repetition, comprehension was assessed and found intact. Fund of Knowledge was assessed and was intact.  Cranial Nerves II - XII - II - Visual field intact OU. III, IV, VI - Extraocular movements intact. V - Facial sensation intact bilaterally. VII - Facial movement intact bilaterally. VIII - Hearing & vestibular intact bilaterally. X - Palate elevates symmetrically. XI - Chin turning & shoulder shrug intact bilaterally. XII - Tongue protrusion intact.  Motor Strength - The patient's strength was normal in all extremities and pronator drift was absent.  Bulk was normal and fasciculations were absent.   Motor Tone - Muscle tone was assessed at the neck and appendages and was normal.  Reflexes - The patient's reflexes were 1+ in all extremities and he had no pathological reflexes.  Sensory - Light touch, temperature/pinprick, vibration and proprioception, and Romberg testing were assessed and were symmetrical.    Coordination - The patient had normal movements in the hands and feet with no ataxia or dysmetria.  Tremor was absent.  Gait and Station - deferred due to safety concerns   ASSESSMENT/PLAN Mr. Ian [name] is a 54 y.o. male with history of tobacco abuse presenting with 4 to 5 days of dizziness. He did not receive IV t-PA due  to delay in arrival.   Stroke:  R cerebellar, L thalamic and bilateral pontine infarcts, secondary to occluded L VA, etiology felt to be embolic secondary to cardiembolic source vs VA dissection, source unclear.   MRI  R cerebellar, L thalamic and bilateral pontine infarcts  MRA head ane neck  Occlusion L VA  2D Echo  No source of embolus   LE venous dopplers - negative for DVT  TEE to look for embolic source. Arranged with [facility] as an OP as schedule for tomorrow is full.   OP tele monitoring requested to look for atrial fibrillation. Can consider a linq if negative. Discussed plan with Dr. Sunnie Nielsen  LDL not able to calculate, TG 661 hence will not statin. Continue fenofibrate  HgbA1c pending  Heparin 5000 units sq tid for VTE prophylaxis Diet - low sodium heart healthy  no antithrombotic prior to admission, now on aspirin 325 mg orally every day. 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 After 3 months, change to  plavix alone. Long-term dual antiplatelets are contraindicated due to risk for intracerebral hemorrhage.  Patient counseled to be compliant with his antithrombotic medications  Ongoing aggressive stroke risk factor management  Therapy recommendations:  OP PT  Disposition:  Home with OP therapies  followup Dr. Roda Shutters in stroke clinic, order placed  Hyperlipidemia  High TG  LDL difficult to calculate, total Chol 204 hence patient will not be prescribed statin  On fenofibrate  Follow up with PCP to consider PCSK9 inhibitors  Tobacco abuse  Current smoker  Smoking cessation counseling provided  Pt is willing to quit    Other Stroke Risk Factors  ETOH use  Obesity, Body mass index is 32.47 kg/(m^2).   Other Active Problems  LLE pain, Joint pain  Hospital day # 2  Neurology will sign  off. Please call with questions. Pt will follow up with Dr. Roda Shutters at [facility] in about 2 months. Thanks for the consult.  Marvel Plan, MD PhD Stroke Neurology 11/09/2014 11:29 PM     To contact Stroke Continuity provider, please refer to [email or web address]. After hours, contact General Neurology

## 2014-11-08 NOTE — Consult Note (Signed)
Stroke Consult    Chief Complaint: dizziness HPI: Ian [name] is an 54 y.o. male presenting with 4 to 5 day history of dizziness. Describes it as a vertigo/moving type sensation and associated lightheadedness. Feels unsteady on his feet. Notes double vision and some trouble with his speech. 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 Denies any recent trauma to neck, no visit to chiropractor. Does note some shoulder and neck pain starting around 1 week ago.   No prior CVA or TIA history.   Date last known well: 11/04/2014 Time last known well: 0800 tPA Given: no, outside tPA window  Past Medical History  Diagnosis Date  . Tobacco abuse   . Leg pain     ABIs (09/29/13):  Normal.  . Hx of cardiovascular stress test     Lexiscan Myoview (09/2013):  No ischemia, EF 62%; NORMAL  . Stomach ulcer     Past Surgical History  Procedure Laterality Date  . Ankle fracture surgery    . Appendectomy      Family History  Problem Relation Age of Onset  . CAD Father 12    Died of MI  . CAD Paternal Grandfather 39    Died of MI  . Heart failure Mother 67    Alive  . Heart disease Daughter     Rapid heart rate   Social History:  reports that he has been smoking Cigarettes.  He has a 35 pack-year smoking history. He does not have any smokeless tobacco history on file. He reports that he drinks alcohol. He reports that he does not use illicit drugs.  Allergies:  Allergies  Allergen Reactions  . Asa [Aspirin]   . Tylenol [Acetaminophen]     Medications Prior to Admission  Medication Sig Dispense Refill  . ALPRAZolam (XANAX) 1 MG tablet Take 1 mg by mouth 3 (three) times daily as needed for anxiety.     . gabapentin (NEURONTIN) 100 MG capsule TAKE 1 CAPSULE (100 MG TOTAL) BY MOUTH 3 (THREE) TIMES A DAY.    . meloxicam (MOBIC) 15 MG tablet Take 15 mg by mouth.    Marland Kitchen  omeprazole (PRILOSEC) 20 MG capsule Take 20 mg by mouth.    . Vitamin D, Ergocalciferol, (DRISDOL) 50000 UNITS CAPS capsule TAKE ONE CAPSULE BY MOUTH WEEKLY      ROS: Out of a complete 14 system review, the patient complains of only the following symptoms, and all other reviewed systems are negative.    Physical Examination: Filed Vitals:   11/08/14 0626  BP: 124/61  Pulse: 65  Temp: 97.9 F (36.6 C)  Resp: 16   Physical Exam  Constitutional: He appears well-developed and well-nourished.  Psych: Affect appropriate to situation Eyes: No scleral injection HENT: No OP obstrucion Head: Normocephalic.  Cardiovascular: Normal rate and regular rhythm.  Respiratory: Effort normal and breath sounds normal.  GI: Soft. Bowel sounds are normal. No distension. There is no tenderness.  Skin: WDI   Neurologic Examination: Mental Status: Alert, oriented, thought content appropriate.  Speech fluent without evidence of aphasia.  Able to follow 3 step commands without difficulty. Cranial Nerves: II: funduscopic exam wnl bilaterally, visual fields grossly normal, pupils equal, round, reactive to light and accommodation III,IV, VI: ptosis not present, extra-ocular motions intact bilaterally V,VII: smile symmetric, facial light touch sensation normal bilaterally VIII: hearing normal bilaterally IX,X: gag reflex present XI: trapezius strength/neck  flexion strength normal bilaterally XII: tongue strength normal  Motor: Right : Upper extremity    Left:     Upper extremity 5/5 deltoid       5/5 deltoid 5/5 biceps      5/5 biceps  5/5 triceps      5/5 triceps 5/5 hand grip      5/5 hand grip  Lower extremity     Lower extremity 5/5 hip flexor      5/5 hip flexor 5/5 quadricep      5/5 quadriceps  5/5 hamstrings     5/5 hamstrings 5/5 plantar flexion       5/5 plantar flexion 5/5 plantar extension     5/5 plantar extension Tone and bulk:normal tone throughout; no atrophy noted Sensory:  Pinprick and light touch intact throughout, bilaterally Deep Tendon Reflexes: 2+ and symmetric throughout Plantars: Right: downgoing   Left: downgoing Cerebellar: normal finger-to-nose, normal rapid alternating movements and normal heel-to-shin test Gait: normal gait and station  Laboratory Studies:   Basic Metabolic Panel:  Recent Labs Lab 11/07/14 1358  NA 137  K 4.2  CL 105  CO2 27  GLUCOSE 96  BUN 12  CREATININE 0.72  CALCIUM 8.8*    Liver Function Tests: No results for input(s): AST, ALT, ALKPHOS, BILITOT, PROT, ALBUMIN in the last 168 hours. No results for input(s): LIPASE, AMYLASE in the last 168 hours. No results for input(s): AMMONIA in the last 168 hours.  CBC:  Recent Labs Lab 11/07/14 1358  WBC 7.9  NEUTROABS 5.4  HGB 16.3  HCT 49.0  MCV 90.1  PLT 202    Cardiac Enzymes:  Recent Labs Lab 11/07/14 1358  TROPONINI <0.03    BNP: Invalid input(s): POCBNP  CBG:  Recent Labs Lab 11/07/14 1342  GLUCAP 95    Microbiology: No results found for this or any previous visit.  Coagulation Studies: No results for input(s): LABPROT, INR in the last 72 hours.  Urinalysis:  Recent Labs Lab 11/07/14 1406  COLORURINE YELLOW  LABSPEC 1.015  PHURINE 5.5  GLUCOSEU NEGATIVE  HGBUR NEGATIVE  BILIRUBINUR NEGATIVE  KETONESUR NEGATIVE  PROTEINUR NEGATIVE  UROBILINOGEN 0.2  NITRITE NEGATIVE  LEUKOCYTESUR NEGATIVE    Lipid Panel:  No results found for: CHOL, TRIG, HDL, CHOLHDL, VLDL, LDLCALC  HgbA1C: No results found for: HGBA1C  Urine Drug Screen:  No results found for: LABOPIA, COCAINSCRNUR, LABBENZ, AMPHETMU, THCU, LABBARB  Alcohol Level: No results for input(s): ETH in the last 168 hours.   Imaging: Dg Chest 2 View  11/07/2014   CLINICAL DATA:  Stroke.  Generalized weakness and dizziness.  EXAM: CHEST  2 VIEW  COMPARISON:  05/24/2013  FINDINGS: Normal heart size and mediastinal contours. No acute infiltrate or edema. No effusion or  pneumothorax. No acute osseous findings.  IMPRESSION: No active cardiopulmonary disease.   Electronically Signed   By: Marnee SpringJonathon  Watts M.D.   On: 11/07/2014 22:07   Ct Head Wo Contrast  11/07/2014   CLINICAL DATA:  Syncopal episodes over the past several days. Dizziness. Remote history of head injury. Initial encounter.  EXAM: CT HEAD WITHOUT CONTRAST  TECHNIQUE: Contiguous axial images were obtained from the base of the skull through the vertex without intravenous contrast.  COMPARISON:  None.  FINDINGS: The patient was unable to remain motionless for the exam. Small or subtle lesions could be overlooked.  Multiple (at least 4) areas of cytotoxic edema affect the RIGHT lateral and superior cerebellum, without associated hemorrhage. No abnormalities on  the LEFT or within the supratentorial compartment (specifically elsewhere in the posterior circulation). The largest area of edema measures up to 1.5 cm in diameter; these are consistent with multiple thrombotic or embolic infarcts, probably within the RIGHT SCA or possibly AICA vascular distribution.  Elsewhere no areas of suspected acute infarction, hemorrhage, mass lesion, hydrocephalus, or extra-axial fluid.  Calvarium intact.  Extracranial soft tissues unremarkable.  No features to suggest large vessel occlusion of the RIGHT vertebral or basilar artery. No posterior fossa mass is evident. No tonsillar herniation.  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 RIGHT vertebral and/or basilar dissection should be excluded. MRI brain with MRA intracranial and extracranial circulation is recommended for further evaluation, if no contraindications.  Findings discussed with EDP.   Electronically Signed   By: Davonna Belling M.D.   On: 11/07/2014 16:17   Mr Maxine Glenn Head Wo Contrast  11/07/2014   CLINICAL DATA:  Visual change.  Stroke.  EXAM: MRA HEAD WITHOUT CONTRAST  TECHNIQUE: Angiographic images of the Circle of Willis were obtained using  MRA technique without intravenous contrast.  COMPARISON:  MRI head 11/07/2014  FINDINGS: Left vertebral artery is occluded proximally with reconstitution from PICA. Right vertebral and right PICA widely patent. Basilar widely patent. Fetal origin of the left posterior cerebral artery. Right posterior communicating artery patent. Both posterior cerebral arteries are patent. Proximal superior cerebellar artery patent bilaterally. Distal superior cerebral artery difficult to visualize due to small size. This patient has acute right superior cerebellar artery stroke.  Internal carotid artery normal bilaterally. 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 Otherwise negative.   Electronically Signed   By: Marlan Palau M.D.   On: 11/07/2014 19:15   Mr Angiogram Neck W Wo Contrast  11/07/2014   CLINICAL DATA:  Stroke.  Visual change  EXAM: MRA NECK WITHOUT AND WITH CONTRAST  TECHNIQUE: Multiplanar and multiecho pulse sequences of the neck were obtained without and with intravenous contrast. Angiographic images of the neck were obtained using MRA technique without and with intravenous contrast.  CONTRAST:  20mL MULTIHANCE GADOBENATE DIMEGLUMINE 529 MG/ML IV SOLN  COMPARISON:  MRI head today  FINDINGS: Occlusion of the proximal left vertebral artery which remains occluded throughout the neck with reconstitution via PICA distally.  Right vertebral artery widely patent. Both carotid arteries are normal without stenosis.  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 There is reconstitution distally via left PICA collaterals.  Normal right vertebral.  Normal carotid artery bilaterally.   Electronically Signed   By: Marlan Palau M.D.   On: 11/07/2014 19:18   Mr Brain Wo Contrast  11/07/2014   CLINICAL DATA:  Visual symptoms.  Abnormal CT.  EXAM: MRI HEAD WITHOUT  CONTRAST  TECHNIQUE: Multiplanar, multiecho pulse sequences of the brain and surrounding structures were obtained without intravenous contrast.  COMPARISON:  CT head 11/07/2014  FINDINGS: Acute infarction right superior cerebellum. Small areas of acute infarct in the pons bilaterally. Small infarct left thalamus. Negative for hemorrhage  Ventricle size normal.  No significant chronic ischemia  Negative for mass or edema.  Mild mucosal edema paranasal sinuses.  Pituitary normal in size.  IMPRESSION: Scattered areas of acute infarct right superior cerebellum. Small areas of acute infarct in the pons bilaterally and left thalamus.   Electronically Signed   By: Marlan Palau M.D.   On: 11/07/2014 19:11    Assessment: 54 y.o. male  with unremarkable past medical history presenting with acute onset of dizziness, gait instability and double vision. 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 Transferred to [facility] for further workup and evaluation.   Plan: 1. HgbA1c, fasting lipid panel 2. MRI, MRA  of the brain without contrast completed 3. PT consult, OT consult, Speech consult 4. Echocardiogram 5. MRA angiogram of neck completed so no indication for carotid u/s 6. Prophylactic therapy-ASA  7. Risk factor modification 8. Telemetry monitoring 9. Frequent neuro checks   Elspeth Cho, DO [facility] [phone]  If 7pm- 7am, please page neurology on call as listed in AMION. 11/08/2014, 7:12 AM

## 2014-11-08 NOTE — Evaluation (Signed)
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    Clinical Impression  Pt very motivated for improving mobility, but limited by dizziness with all movements.  Pt needs consistent cues to slow down and stabilize gaze prior to mobility.  Pt has difficulty with smooth pursuits to R side and saccades to R side, both eliciting dizziness and blurred vision.  Feel pt will need OPPT for Vestibular rehab at [facility].  Will continue to follow.      Follow Up Recommendations Outpatient PT (Vestibular Therapy)    Equipment Recommendations  None recommended by PT    Recommendations for Other Services       Precautions / Restrictions Precautions Precautions: Fall Restrictions Weight Bearing Restrictions: No      Mobility  Bed Mobility Overal bed mobility: Modified Independent             General bed mobility comments: pt moves well, but indicates dizziness with coming to sitting and when returning to supine.    Transfers Overall transfer level: Needs assistance Equipment used: None Transfers: Sit to/from Stand Sit to Stand: Supervision         General transfer comment: cues for gaze stabilization and UE use.  pt indicates dizziness.    Ambulation/Gait Ambulation/Gait assistance: Min assist Ambulation Distance (Feet): 100 Feet Assistive device: None Gait Pattern/deviations: Step-through pattern;Decreased stride length     General Gait Details: cues for gaze stabilization and finding targets in hallway for gaze.  pt unsteady  with increased dizziness and not always forthcoming when he is feeling dizzy.  pt needed step-by-step cueing for gaze stabilization while ambulating.    Stairs            Wheelchair Mobility    Modified Rankin (Stroke Patients Only) Modified Rankin (Stroke Patients  Only) Pre-Morbid Rankin Score: No symptoms Modified Rankin: Moderately severe disability     Balance Overall balance assessment: Needs assistance         Standing balance support: No upper extremity supported;During functional activity Standing balance-Leahy Scale: Poor                               Pertinent Vitals/Pain Pain Assessment: No/denies pain    Home Living Family/patient expects to be discharged to:: Private residence Living Arrangements: Alone Available Help at Discharge: Family;Available PRN/intermittently (pt indicates family can be with him most of the time.  ) Type of Home: House Home Access: Stairs to enter   [employer] of Steps: 2 Home Layout: One level Home Equipment: None      Prior Function Level of Independence: Independent               Hand Dominance        Extremity/Trunk Assessment   Upper Extremity Assessment: Defer to OT evaluation           Lower Extremity Assessment: Overall WFL for tasks assessed      Cervical / Trunk Assessment: Normal  Communication   Communication: No difficulties  Cognition Arousal/Alertness: Awake/alert Behavior During Therapy: WFL for tasks assessed/performed Overall Cognitive Status: Within Functional Limits for tasks assessed                      General Comments General comments (skin integrity, edema, etc.): pt  ed on mobility with Nsg staff and utilizing gaze stabiliation.  pt instructed in x1 exercises.      Exercises        Assessment/Plan    PT Assessment Patient needs continued PT services  PT Diagnosis Difficulty walking   PT Problem List Decreased activity tolerance;Decreased balance;Decreased mobility;Decreased coordination;Decreased knowledge of use of DME  PT Treatment Interventions DME instruction;Gait training;Stair training;Functional mobility training;Therapeutic activities;Therapeutic exercise;Balance training;Neuromuscular  re-education;Patient/family education   PT Goals (Current goals can be found in the Care Plan section) Acute Rehab PT Goals Patient Stated Goal: Back to normal. PT Goal Formulation: With patient Time For Goal Achievement: 11/15/14 Potential to Achieve Goals: Good    Frequency Min 4X/week   Barriers to discharge        Co-evaluation               End of Session Equipment Utilized During Treatment: Gait belt Activity Tolerance: Patient tolerated treatment well Patient left: in bed;with call bell/phone within reach;with family/visitor present Nurse Communication: Mobility status         Time: [id number] PT Time Calculation (min) (ACUTE ONLY): 38 min   Charges:   PT Evaluation $Initial PT Evaluation Tier I: 1 Procedure PT Treatments $Gait Training: 8-22 mins $Therapeutic Activity: 8-22 mins   PT G CodesSunny Schlein:        Gar Glance F, [place] CarolinaPT [phone] 11/08/2014, 2:31 PM

## 2014-11-08 NOTE — Progress Notes (Signed)
[facility] PROGRESS NOTE  Ian [name] WJX:[id number] DOB: 01/15/1961 DOA: 11/07/2014 PCP: Josue HectorNYLAND,LEONARD ROBERT, MD  Assessment/Plan: 1-Acute infarct right superior cerebellum:  ECHO no source of embolism identified, preserve EF.  and doppler pending.  MRI positive for scattered areas of acute infarct right superior cerebellum.  LDL and HbA1c pending.  Continue with aspirin.  Neurology following.   2-Left LE pain, joint pain. Uric acid 3.1, B 12 420. TSH; 1.8 3-Screening for HIV.   Code Status: Full Code.  Family Communication: care discussed with patient.  Disposition Plan: to be determine. PT pending.    Consultants:  Neurology  Procedures:  ECHO;  Doppler:    Antibiotics:  none  HPI/Subjective: Feeling better. He was having dizziness.  He report headaches when he moves his neck He relates left LE pain for years.   Objective: Filed Vitals:   11/08/14 0626  BP: 124/61  Pulse: 65  Temp: 97.9 F (36.6 C)  Resp: 16   No intake or output data in the 24 hours ending 11/08/14 1029 Filed Weights   11/07/14 1302  Weight: 99.791 kg (220 lb)    Exam:   General:  Alert in no distress.   Cardiovascular: S 1, S 2 RRR  Respiratory: CTA  Abdomen: BS present, soft,   Musculoskeletal: no edema, mild left LE pain.   Neuro; alert, speech clear, motor strength 5/5/   Data Reviewed: Basic Metabolic Panel:  Recent Labs Lab 11/07/14 1358  NA 137  K 4.2  CL 105  CO2 27  GLUCOSE 96  BUN 12  CREATININE 0.72  CALCIUM 8.8*   Liver Function Tests: No results for input(s): AST, ALT, ALKPHOS, BILITOT, PROT, ALBUMIN in the last 168 hours. No results for input(s): LIPASE, AMYLASE in the last 168 hours. No results for input(s): AMMONIA in the last 168 hours. CBC:  Recent Labs Lab 11/07/14 1358  WBC 7.9  NEUTROABS 5.4  HGB 16.3  HCT 49.0  MCV 90.1  PLT 202   Cardiac Enzymes:  Recent Labs Lab 11/07/14 1358  TROPONINI <0.03   BNP  (last 3 results) No results for input(s): BNP in the last 8760 hours.  ProBNP (last 3 results) No results for input(s): PROBNP in the last 8760 hours.  CBG:  Recent Labs Lab 11/07/14 1342  GLUCAP 95    No results found for this or any previous visit (from the past 240 hour(s)).   Studies: Dg Chest 2 View  11/07/2014   CLINICAL DATA:  Stroke.  Generalized weakness and dizziness.  EXAM: CHEST  2 VIEW  COMPARISON:  05/24/2013  FINDINGS: Normal heart size and mediastinal contours. No acute infiltrate or edema. No effusion or pneumothorax. No acute osseous findings.  IMPRESSION: No active cardiopulmonary disease.   Electronically Signed   By: Marnee SpringJonathon  Watts M.D.   On: 11/07/2014 22:07   Ct Head Wo Contrast  11/07/2014   CLINICAL DATA:  Syncopal episodes over the past several days. Dizziness. Remote history of head injury. Initial encounter.  EXAM: CT HEAD WITHOUT CONTRAST  TECHNIQUE: Contiguous axial images were obtained from the base of the skull through the vertex without intravenous contrast.  COMPARISON:  None.  FINDINGS: The patient was unable to remain motionless for the exam. Small or subtle lesions could be overlooked.  Multiple (at least 4) areas of cytotoxic edema affect the RIGHT lateral and superior cerebellum, without associated hemorrhage. No abnormalities on the LEFT or within the supratentorial compartment (specifically elsewhere in the posterior circulation). The largest  area of edema measures up to 1.5 cm in diameter; these are consistent with multiple thrombotic or embolic infarcts, probably within the RIGHT SCA or possibly AICA vascular distribution.  Elsewhere no areas of suspected acute infarction, hemorrhage, mass lesion, hydrocephalus, or extra-axial fluid.  Calvarium intact.  Extracranial soft tissues unremarkable.  No features to suggest large vessel occlusion of the RIGHT vertebral or basilar artery. No posterior fossa mass is evident. No tonsillar herniation.  IMPRESSION:  Acute nonhemorrhagic RIGHT cerebellar infarctions as described. RIGHT vertebral and/or basilar dissection should be excluded. MRI brain with MRA intracranial and extracranial circulation is recommended for further evaluation, if no contraindications.  Findings discussed with EDP.   Electronically Signed   By: Davonna Belling M.D.   On: 11/07/2014 16:17   Mr Maxine Glenn Head Wo Contrast  11/07/2014   CLINICAL DATA:  Visual change.  Stroke.  EXAM: MRA HEAD WITHOUT CONTRAST  TECHNIQUE: Angiographic images of the Circle of Willis were obtained using MRA technique without intravenous contrast.  COMPARISON:  MRI head 11/07/2014  FINDINGS: Left vertebral artery is occluded proximally with reconstitution from PICA. Right vertebral and right PICA widely patent. Basilar widely patent. Fetal origin of the left posterior cerebral artery. Right posterior communicating artery patent. Both posterior cerebral arteries are patent. Proximal superior cerebellar artery patent bilaterally. Distal superior cerebral artery difficult to visualize due to small size. This patient has acute right superior cerebellar artery stroke.  Internal carotid artery normal bilaterally. 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 Otherwise negative.   Electronically Signed   By: Marlan Palau M.D.   On: 11/07/2014 19:15   Mr Angiogram Neck W Wo Contrast  11/07/2014   CLINICAL DATA:  Stroke.  Visual change  EXAM: MRA NECK WITHOUT AND WITH CONTRAST  TECHNIQUE: Multiplanar and multiecho pulse sequences of the neck were obtained without and with intravenous contrast. Angiographic images of the neck were obtained using MRA technique without and with intravenous contrast.  CONTRAST:  20mL MULTIHANCE GADOBENATE DIMEGLUMINE 529 MG/ML IV SOLN  COMPARISON:  MRI head today  FINDINGS: Occlusion of the proximal left vertebral artery which  remains occluded throughout the neck with reconstitution via PICA distally.  Right vertebral artery widely patent. Both carotid arteries are normal without stenosis.  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 There is reconstitution distally via left PICA collaterals.  Normal right vertebral.  Normal carotid artery bilaterally.   Electronically Signed   By: Marlan Palau M.D.   On: 11/07/2014 19:18   Mr Brain Wo Contrast  11/07/2014   CLINICAL DATA:  Visual symptoms.  Abnormal CT.  EXAM: MRI HEAD WITHOUT CONTRAST  TECHNIQUE: Multiplanar, multiecho pulse sequences of the brain and surrounding structures were obtained without intravenous contrast.  COMPARISON:  CT head 11/07/2014  FINDINGS: Acute infarction right superior cerebellum. Small areas of acute infarct in the pons bilaterally. Small infarct left thalamus. Negative for hemorrhage  Ventricle size normal.  No significant chronic ischemia  Negative for mass or edema.  Mild mucosal edema paranasal sinuses.  Pituitary normal in size.  IMPRESSION: Scattered areas of acute infarct right superior cerebellum. Small areas of acute infarct in the pons bilaterally and left thalamus.   Electronically Signed   By: Marlan Palau M.D.   On: 11/07/2014 19:11    Scheduled Meds: . aspirin EC  325 mg Oral Daily   Or  . aspirin  300 mg  Rectal Daily  . gabapentin  100 mg Oral TID  . heparin  5,000 Units Subcutaneous 3 times per day  . pantoprazole  40 mg Oral Daily  . [START ON 11/14/2014] Vitamin D (Ergocalciferol)  50,000 Units Oral Q Tue   Continuous Infusions:   Active Problems:   Posterior circulation stroke   Tobacco abuse    Time spent: 35 minutes.     Hartley Barefoot A  [facility] Pager [phone]. If 7PM-7AM, please contact night-coverage at [email or web address], password [facility] 11/08/2014, 10:29 AM  LOS: 1 day

## 2014-11-08 NOTE — Progress Notes (Signed)
Pt arrived to unit alert and oriented. Oriented to unit and room. Call bell at bedside. Bed alarm activated. Will continue to monitor. Indigo Chaddock M, RN 

## 2014-11-08 NOTE — Care Management Note (Signed)
Case Management Note  Patient Details  Name: Ian ChessMichael D [name] MRN: [id number] Date of Birth: 01/15/1961  Subjective/Objective:  54 y.o. [name] admitted with Acute Infarct. All sx resolved at this time. From Home alone              Action/Plan: Anticipate no discharge needs. Will continue to follow   Expected Discharge Date:                  Expected Discharge Plan:  Home/Self Care (Anticipate discharge Home with No needs; PT/OT eval pending)  In-House Referral:     Discharge planning Services  CM Consult  Post Acute Care Choice:    Choice offered to:     DME Arranged:    DME Agency:     HH Arranged:    HH Agency:     Status of Service:  In process, will continue to follow  Medicare Important Message Given:    Date Medicare IM Given:    Medicare IM give by:    Date Additional Medicare IM Given:    Additional Medicare Important Message give by:     If discussed at Long Length of Stay Meetings, dates discussed:    Additional Comments:  Ian [name], Ian Javed M, RN 11/08/2014, 2:10 PM

## 2014-11-08 NOTE — Progress Notes (Signed)
Echocardiogram 2D Echocardiogram has been performed.  Nolon RodBrown, Tony 11/08/2014, 9:43 AM

## 2014-11-09 ENCOUNTER — Inpatient Hospital Stay (HOSPITAL_COMMUNITY): Payer: Medicaid Other

## 2014-11-09 DIAGNOSIS — I639 Cerebral infarction, unspecified: Secondary | ICD-10-CM

## 2014-11-09 DIAGNOSIS — E785 Hyperlipidemia, unspecified: Secondary | ICD-10-CM

## 2014-11-09 LAB — LIPID PANEL
Cholesterol: 204 mg/dL — ABNORMAL HIGH (ref 0–200)
HDL: 28 mg/dL — AB (ref >40)
LDL Cholesterol: UNDETERMINED mg/dL (ref 0–99)
Total CHOL/HDL Ratio: 7.3 RATIO
Triglycerides: 661 mg/dL — ABNORMAL HIGH (ref <150)
VLDL: UNDETERMINED mg/dL (ref 0–40)

## 2014-11-09 LAB — HIV ANTIBODY (ROUTINE TESTING W REFLEX): HIV SCREEN 4TH GENERATION: NONREACTIVE

## 2014-11-09 MED ORDER — FENOFIBRATE 160 MG PO TABS: 160.0000 mg | ORAL_TABLET | Freq: Every day | ORAL | Status: DC

## 2014-11-09 MED ORDER — CLOPIDOGREL BISULFATE 75 MG PO TABS: 75.0000 mg | ORAL_TABLET | Freq: Every day | ORAL | Status: DC

## 2014-11-09 MED ORDER — ASPIRIN 81 MG PO TBEC: 81.0000 mg | DELAYED_RELEASE_TABLET | Freq: Every day | ORAL | Status: DC

## 2014-11-09 MED ORDER — ASPIRIN EC 81 MG PO TBEC: 81.0000 mg | DELAYED_RELEASE_TABLET | Freq: Every day | ORAL | Status: DC

## 2014-11-09 NOTE — Progress Notes (Signed)
Physical Therapy Note  Pt dizziness and mobility much improved today and only indicates symptoms during VOR cancellation exercises.  Pt ed on not driving at this point and continuing to f/u with OPPT to ensure continued improvement in symptoms.  Will continue to follow while on acute.      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    PT Time Calculation  PT Start Time (ACUTE ONLY) 0927  PT Stop Time (ACUTE ONLY) 0953  PT Time Calculation (min) (ACUTE ONLY) 26 min  Subjective Data  Patient Stated Goal Back to normal.  Precautions  Precautions Fall  Restrictions  Weight Bearing Restrictions No  Pain Assessment  Pain Assessment No/denies pain  Cognition  Arousal/Alertness Awake/alert  Behavior During Therapy WFL for tasks assessed/performed  Overall Cognitive Status Within Functional Limits for tasks assessed  Bed Mobility  Overal bed mobility Modified Independent  General bed mobility comments Denies dizziness today.    Transfers  Overall transfer level Needs assistance  Equipment used None  Transfers Sit to/from Stand  Sit to Stand Supervision  General transfer comment pt able to come to stand without dizziness and without need for gaze stabilization.    Ambulation/Gait  Ambulation/Gait assistance Supervision  Ambulation Distance (Feet) 300 Feet  Assistive device None  General Gait Details pt much improved today and able to perform ambulation with head turns and changes in direction without dizziness.    Gait Pattern/deviations Step-through pattern;Decreased stride length  Modified Rankin (Stroke Patients Only)  Pre-Morbid Rankin Score 0  Modified Rankin 4  Balance  Overall balance assessment Needs assistance  Standing balance support No upper extremity supported  Standing balance-Leahy Scale Fair  General Comments   General comments (skin integrity, edema, etc.) pt ed on VOR cancellation exercises.  pt does still indicate dizziness with these activities.    PT - End of Session  Equipment Utilized During Treatment Gait belt  Activity Tolerance Patient tolerated treatment well  Patient left in chair;with call bell/phone within reach  Nurse Communication Mobility status  PT - Assessment/Plan  PT Plan Current plan remains appropriate  PT Frequency (ACUTE ONLY) Min 4X/week  Follow Up Recommendations Outpatient PT (Vestibular)  PT equipment None recommended by PT  PT Goal Progression  Progress towards PT goals Progressing toward goals  Acute Rehab PT Goals  PT Goal Formulation With patient  Time For Goal Achievement 11/15/14  Potential to Achieve Goals Good  PT General Charges  $$ ACUTE PT VISIT 1 Procedure  PT Treatments  $Gait Training 8-22 mins  $Therapeutic Activity 8-22 mins    Warrick Llera, PT [phone]

## 2014-11-09 NOTE — Discharge Summary (Signed)
Physician Discharge Summary  Ian [name]:[id number] DOB: 06-25-1960 DOA: 11/07/2014  PCP: Ian Hector, MD  Admit date: 11/07/2014 Discharge date: 11/09/2014  Time spent: 35 minutes  Recommendations for Outpatient Follow-up:  1. Needs outpatient TEE . 2. Please follow up Hb-A1c.  3.   Discharge Diagnoses:  Active Problems:   Posterior circulation stroke   Tobacco abuse   Discharge Condition: Stable.   Diet recommendation: Heart Healthy  Filed Weights   11/07/14 1302  Weight: 99.791 kg (220 lb)    History of present illness:  Ian [name] is a 54 y.o. male  This is a 54 year old man who gives a 4-5 day history of dizziness. He describes everything moving but also lightheadedness as if he was going to faint. This started around and we over the last few days. He has noticed that his gait is not stable. He has also noticed problems with speech in that he would say a word to 3 times. He denies any dysarthria. He describes diplopia also. No headache or loss of consciousness. No fevers. No chest pain, palpitations or dyspnea. He has not had any vomiting. Evaluation in the emergency room with MRI brain scan shows the presence of scattered areas of acute infarct in the right superior cerebellum and also small areas of acute infarction in the pons bilaterally and left thalamus. He is now being admitted for further investigation and management.  Hospital Course:  1-Acute infarct right superior cerebellum:  ECHO no source of embolism identified, preserve EF.  MRI positive for scattered areas of acute infarct right superior cerebellum.  LDL, triglyceride elevated. Started on tricor.  and HbA1c pending.  Continue with aspirin and plavix for 3 months. After 3 months plavix alone.  Neurology following.  Plan for outpatient TEE and monitor/   2-Left LE pain, joint pain. Uric acid 3.1, B 12 420. TSH; 1.8 Doppler negative.   3-Screening for HIV negative.    Procedures:  ECHO;Impressions: No cardiac source of embolism was identified, but cannot be ruled out on the basis of this examination.  Doppler:   Consultations:  neurology  Discharge Exam: Filed Vitals:   11/09/14 1401  BP: 142/67  Pulse: 72  Temp: 98.5 F (36.9 C)  Resp: 18    General: Alert in no distress.  Cardiovascular: S 1, S 2 RRR Respiratory: CTA  Discharge Instructions   Discharge Instructions    Ambulatory referral to Neurology    Complete by:  As directed   Dr. Roda [name] requests followup in 2 months          Current Discharge Medication List    CONTINUE these medications which have NOT CHANGED   Details  ALPRAZolam (XANAX) 1 MG tablet Take 1 mg by mouth 3 (three) times daily as needed for anxiety.     gabapentin (NEURONTIN) 100 MG capsule TAKE 1 CAPSULE (100 MG TOTAL) BY MOUTH 3 (THREE) TIMES A DAY.    meloxicam (MOBIC) 15 MG tablet Take 15 mg by mouth.    omeprazole (PRILOSEC) 20 MG capsule Take 20 mg by mouth.    Vitamin D, Ergocalciferol, (DRISDOL) 50000 UNITS CAPS capsule TAKE ONE CAPSULE BY MOUTH WEEKLY       Allergies  Allergen Reactions  . Tylenol [Acetaminophen]    Follow-up Information    Follow up with Xu,Jindong, MD In 2 months.   Specialty:  Neurology   Why:  Stroke Clinic, Office will call you with appointment date & time   Contact information:   [phone]  [address] [place] [id number] [phone]        The results of significant diagnostics from this hospitalization (including imaging, microbiology, ancillary and laboratory) are listed below for reference.    Significant Diagnostic Studies: Dg Chest 2 View  11/07/2014   CLINICAL DATA:  Stroke.  Generalized weakness and dizziness.  EXAM: CHEST  2 VIEW  COMPARISON:  05/24/2013  FINDINGS: Normal heart size and mediastinal contours. No acute infiltrate or edema. No effusion or pneumothorax. No acute osseous findings.  IMPRESSION: No active cardiopulmonary  disease.   Electronically Signed   By: Marnee SpringJonathon  Watts M.D.   On: 11/07/2014 22:07   Ct Head Wo Contrast  11/07/2014   CLINICAL DATA:  Syncopal episodes over the past several days. Dizziness. Remote history of head injury. Initial encounter.  EXAM: CT HEAD WITHOUT CONTRAST  TECHNIQUE: Contiguous axial images were obtained from the base of the skull through the vertex without intravenous contrast.  COMPARISON:  None.  FINDINGS: The patient was unable to remain motionless for the exam. Small or subtle lesions could be overlooked.  Multiple (at least 4) areas of cytotoxic edema affect the RIGHT lateral and superior cerebellum, without associated hemorrhage. No abnormalities on the LEFT or within the supratentorial compartment (specifically elsewhere in the posterior circulation). The largest area of edema measures up to 1.5 cm in diameter; these are consistent with multiple thrombotic or embolic infarcts, probably within the RIGHT SCA or possibly AICA vascular distribution.  Elsewhere no areas of suspected acute infarction, hemorrhage, mass lesion, hydrocephalus, or extra-axial fluid.  Calvarium intact.  Extracranial soft tissues unremarkable.  No features to suggest large vessel occlusion of the RIGHT vertebral or basilar artery. No posterior fossa mass is evident. No tonsillar herniation.  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 RIGHT vertebral and/or basilar dissection should be excluded. MRI brain with MRA intracranial and extracranial circulation is recommended for further evaluation, if no contraindications.  Findings discussed with EDP.   Electronically Signed   By: Davonna BellingJohn  Curnes M.D.   On: 11/07/2014 16:17   Mr Maxine GlennMra Head Wo Contrast  11/07/2014   CLINICAL DATA:  Visual change.  Stroke.  EXAM: MRA HEAD WITHOUT CONTRAST  TECHNIQUE: Angiographic images of the Circle of Willis were obtained using MRA technique without intravenous contrast.  COMPARISON:  MRI head 11/07/2014   FINDINGS: Left vertebral artery is occluded proximally with reconstitution from PICA. Right vertebral and right PICA widely patent. Basilar widely patent. Fetal origin of the left posterior cerebral artery. Right posterior communicating artery patent. Both posterior cerebral arteries are patent. Proximal superior cerebellar artery patent bilaterally. Distal superior cerebral artery difficult to visualize due to small size. This patient has acute right superior cerebellar artery stroke.  Internal carotid artery normal bilaterally. 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 Otherwise negative.   Electronically Signed   By: Marlan Palauharles  Clark M.D.   On: 11/07/2014 19:15   Mr Angiogram Neck W Wo Contrast  11/07/2014   CLINICAL DATA:  Stroke.  Visual change  EXAM: MRA NECK WITHOUT AND WITH CONTRAST  TECHNIQUE: Multiplanar and multiecho pulse sequences of the neck were obtained without and with intravenous contrast. Angiographic images of the neck were obtained using MRA technique without and with intravenous contrast.  CONTRAST:  20mL MULTIHANCE GADOBENATE DIMEGLUMINE 529 MG/ML IV SOLN  COMPARISON:  MRI head today  FINDINGS: Occlusion of the proximal left vertebral  artery which remains occluded throughout the neck with reconstitution via PICA distally.  Right vertebral artery widely patent. Both carotid arteries are normal without stenosis.  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 There is reconstitution distally via left PICA collaterals.  Normal right vertebral.  Normal carotid artery bilaterally.   Electronically Signed   By: Marlan Palau M.D.   On: 11/07/2014 19:18   Mr Brain Wo Contrast  11/07/2014   CLINICAL DATA:  Visual symptoms.  Abnormal CT.  EXAM: MRI HEAD WITHOUT CONTRAST  TECHNIQUE: Multiplanar, multiecho pulse sequences of the brain and  surrounding structures were obtained without intravenous contrast.  COMPARISON:  CT head 11/07/2014  FINDINGS: Acute infarction right superior cerebellum. Small areas of acute infarct in the pons bilaterally. Small infarct left thalamus. Negative for hemorrhage  Ventricle size normal.  No significant chronic ischemia  Negative for mass or edema.  Mild mucosal edema paranasal sinuses.  Pituitary normal in size.  IMPRESSION: Scattered areas of acute infarct right superior cerebellum. Small areas of acute infarct in the pons bilaterally and left thalamus.   Electronically Signed   By: Marlan Palau M.D.   On: 11/07/2014 19:11    Microbiology: No results found for this or any previous visit (from the past 240 hour(s)).   Labs: Basic Metabolic Panel:  Recent Labs Lab 11/07/14 1358  NA 137  K 4.2  CL 105  CO2 27  GLUCOSE 96  BUN 12  CREATININE 0.72  CALCIUM 8.8*   Liver Function Tests: No results for input(s): AST, ALT, ALKPHOS, BILITOT, PROT, ALBUMIN in the last 168 hours. No results for input(s): LIPASE, AMYLASE in the last 168 hours. No results for input(s): AMMONIA in the last 168 hours. CBC:  Recent Labs Lab 11/07/14 1358  WBC 7.9  NEUTROABS 5.4  HGB 16.3  HCT 49.0  MCV 90.1  PLT 202   Cardiac Enzymes:  Recent Labs Lab 11/07/14 1358  TROPONINI <0.03   BNP: BNP (last 3 results) No results for input(s): BNP in the last 8760 hours.  ProBNP (last 3 results) No results for input(s): PROBNP in the last 8760 hours.  CBG:  Recent Labs Lab 11/07/14 1342  GLUCAP 95       Signed:  Hartley Barefoot A  [facility] 11/09/2014, 2:39 PM

## 2014-11-09 NOTE — Care Management Note (Signed)
Case Management Note  Patient Details  Name: KENRY DAUBERT MRN: [id number] Date of Birth: [birth year]  Subjective/Objective:                    Action/Plan:  Met with patient to discuss arranging outpatient vestibular rehab.  Patient is currently unsure if he is interested in having vestibular rehab after discharge.  CM requested that patient give it some more thought and discuss again prior to discharge.  Expected Discharge Date:                  Expected Discharge Plan:  Home/Self Care (Anticipate discharge Home with No needs; PT/OT eval pending)  In-House Referral:     Discharge planning Services  CM Consult  Post Acute Care Choice:    Choice offered to:     DME Arranged:    DME Agency:     HH Arranged:    HH Agency:     Status of Service:  In process, will continue to follow  Medicare Important Message Given:    Date Medicare IM Given:    Medicare IM give by:    Date Additional Medicare IM Given:    Additional Medicare Important Message give by:     If discussed at [place] of Stay Meetings, dates discussed:    Additional Comments:  Rolm Baptise, RN 11/09/2014, 11:33 AM

## 2014-11-09 NOTE — Care Management Note (Signed)
Case Management Note  Patient Details  Name: Ian [name] MRN: [id number] Date of Birth: 11/12/1960  Subjective/Objective:                    Action/Plan: Spoke with patient as follow-up to previous conversation. Patient is still unsure if he is interested in outpatient vestibular rehab.  CM provided patient with a copy of the electronic order as well as phone numbers for facilities close to his home, should he change his mind after discharge.  Bedside RN updated.  Expected Discharge Date:                  Expected Discharge Plan:  Home/Self Care (Anticipate discharge Home with No needs; PT/OT eval pending)  In-House Referral:     Discharge planning Services  CM Consult  Post Acute Care Choice:    Choice offered to:     DME Arranged:    DME Agency:     HH Arranged:    HH Agency:     Status of Service:  Completed, signed off  Medicare Important Message Given:    Date Medicare IM Given:    Medicare IM give by:    Date Additional Medicare IM Given:    Additional Medicare Important Message give by:     If discussed at Long Length of Stay Meetings, dates discussed:    Additional Comments:  Ian [name], Ian Glantz C, RN 11/09/2014, 2:40 PM

## 2014-11-09 NOTE — Progress Notes (Signed)
Chapalin stopped by during rounds to visit the PT and noticed the joyful expression of the PT. After introducing myself, the PT said he was going home soon. The Chapalin discussed his having a support system and he said he had children. The PT discussed  other family dynamics i.e. Spouse, siblings,etc.  The Chapalin let the PT know the chapalins are available when needed during his stay here. The Chapalin will    11/09/14 1000  Clinical Encounter Type  Visited With Patient  Visit Type Initial;Spiritual support  Referral From Other (Comment) (Cold visit)  Spiritual Encounters  Spiritual Needs Emotional   follow-up until release.

## 2014-11-09 NOTE — Progress Notes (Signed)
Bilateral lower extremity venous duplex completed:  No evidence of DVT, superficial thrombosis, or Baker's cyst.   

## 2014-11-09 NOTE — Evaluation (Signed)
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     Clinical Impression   Patient evaluated by Occupational Therapy with no further acute OT needs identified. All education has been completed and the patient has no further questions. See below for any follow-up Occupational Therapy or equipment needs. OT to sign off. Thank you for referral.      Follow Up Recommendations  No OT follow up    Equipment Recommendations  None recommended by OT    Recommendations for Other Services       Precautions / Restrictions Precautions Precautions: Fall      Mobility Bed Mobility Overal bed mobility: Modified Independent                Transfers Overall transfer level: Modified independent               General transfer comment: pt moves very quickly and cued to decr speed.     Balance                                            ADL Overall ADL's : Independent;At baseline                                       General ADL Comments: pt with various head positioned during session via cues from OT to attempt to cause dizziness, spinning / any kind of balance challenge. Pt able to return demonstrate exercises from PT session. Ot adding exercise of holding object still and making Yes / NO motion with head with eyes fixated on object. Pt able to return demo. pt completed bed , toilet and grooming task.     Vision Vision Assessment?: Yes Eye Alignment: Within Functional Limits Ocular Range of Motion: Within Functional Limits Tracking/Visual Pursuits: Able to track stimulus in all quads without difficulty   Perception     Praxis      Pertinent Vitals/Pain Pain Assessment: No/denies pain     Hand Dominance Right   Extremity/Trunk Assessment  Upper Extremity Assessment Upper Extremity Assessment: Overall WFL for tasks assessed   Lower Extremity Assessment Lower Extremity Assessment: Defer to PT evaluation   Cervical / Trunk Assessment Cervical / Trunk Assessment: Normal   Communication Communication Communication: No difficulties   Cognition Arousal/Alertness: Awake/alert Behavior During Therapy: WFL for tasks assessed/performed Overall Cognitive Status: Within Functional Limits for tasks assessed                     General Comments       Exercises       Shoulder Instructions      Home Living Family/patient expects to be discharged to:: Private residence Living Arrangements: Alone Available Help at Discharge: Family;Available PRN/intermittently Type of Home: House Home Access: Stairs to enter [employer] of Steps: 2   Home Layout: One level     Bathroom Shower/Tub: Chief Strategy Officer: Standard     Home Equipment: None   Additional Comments: drives      Prior Functioning/Environment Level of Independence: Independent  OT Diagnosis:     OT Problem List:     OT Treatment/Interventions:      OT Goals(Current goals can be found in the care plan section)    OT Frequency:     Barriers to D/C:            Co-evaluation              End of Session Nurse Communication: Mobility status;Precautions  Activity Tolerance: Patient tolerated treatment well Patient left: in bed;with call bell/phone within reach   Time: 1335-1400 OT Time Calculation (min): 25 min Charges:  OT General Charges $OT Visit: 1 Procedure OT Evaluation $Initial OT Evaluation Tier I: 1 Procedure G-Codes:    Ian [name], Ian [name] 11/09/2014, 2:28 PM   Pager: [phone]

## 2014-11-09 NOTE — Progress Notes (Signed)
Pt is being discharged home. Discharge instructions were given to patient and family 

## 2014-11-10 LAB — HEMOGLOBIN A1C
HEMOGLOBIN A1C: 5.8 % — AB (ref 4.8–5.6)
Mean Plasma Glucose: 120 mg/dL

## 2014-11-17 ENCOUNTER — Telehealth: Payer: Self-pay | Admitting: *Deleted

## 2014-11-17 NOTE — Telephone Encounter (Signed)
Close encounter 

## 2014-11-17 NOTE — Telephone Encounter (Signed)
Did not need to open an encounter

## 2014-11-21 ENCOUNTER — Other Ambulatory Visit: Payer: Self-pay | Admitting: *Deleted

## 2014-11-21 ENCOUNTER — Telehealth: Payer: Self-pay | Admitting: *Deleted

## 2014-11-21 MED ORDER — FENOFIBRATE 145 MG PO TABS: 145.0000 mg | ORAL_TABLET | Freq: Every day | ORAL | Status: DC

## 2014-11-21 NOTE — Telephone Encounter (Signed)
Pt.s daughter called about his medication and I informed her that the fenofibrate has been changed to Tricor 145 mg Daily and has been sent to the pharmacy

## 2014-11-27 ENCOUNTER — Telehealth: Payer: Self-pay | Admitting: *Deleted

## 2014-11-27 NOTE — Telephone Encounter (Signed)
Spoke with patient and scheduled him for 2 month hospital FU with Dr Roda Shutters, 01/11/15, 9 am. Requested pt arrive at 8:45 am. He verbalized understanding.

## 2015-01-08 ENCOUNTER — Ambulatory Visit: Payer: Self-pay | Admitting: Neurology

## 2015-01-11 ENCOUNTER — Ambulatory Visit: Payer: Self-pay | Admitting: Neurology

## 2015-02-05 ENCOUNTER — Ambulatory Visit: Payer: Self-pay | Admitting: Neurology

## 2015-03-05 ENCOUNTER — Ambulatory Visit (INDEPENDENT_AMBULATORY_CARE_PROVIDER_SITE_OTHER): Payer: Medicaid Other | Admitting: Neurology

## 2015-03-05 ENCOUNTER — Encounter: Payer: Self-pay | Admitting: Neurology

## 2015-03-05 VITALS — BP 113/73 | HR 83 | Ht 69.0 in | Wt 217.2 lb

## 2015-03-05 DIAGNOSIS — E785 Hyperlipidemia, unspecified: Secondary | ICD-10-CM

## 2015-03-05 DIAGNOSIS — I6509 Occlusion and stenosis of unspecified vertebral artery: Secondary | ICD-10-CM

## 2015-03-05 DIAGNOSIS — R002 Palpitations: Secondary | ICD-10-CM | POA: Insufficient documentation

## 2015-03-05 DIAGNOSIS — Z720 Tobacco use: Secondary | ICD-10-CM

## 2015-03-05 DIAGNOSIS — I6350 Cerebral infarction due to unspecified occlusion or stenosis of unspecified cerebral artery: Secondary | ICD-10-CM

## 2015-03-05 DIAGNOSIS — F17200 Nicotine dependence, unspecified, uncomplicated: Secondary | ICD-10-CM

## 2015-03-05 MED ORDER — ATORVASTATIN CALCIUM 20 MG PO TABS: 20.0000 mg | ORAL_TABLET | Freq: Every day | ORAL | Status: DC

## 2015-03-05 NOTE — Progress Notes (Signed)
STROKE NEUROLOGY FOLLOW UP NOTE  NAME: Ian [name] DOB: 05-Jun-1961  REASON FOR VISIT: stroke follow up HISTORY FROM: pt and chart  Today we had the pleasure of seeing Ian [name] in follow-up at our Neurology Clinic. Pt was accompanied by wife.   History Summary Ian [name] is a 54 y.o. male with history of tobacco abuse was admitted on 11/07/14 for dizziness and vertigo. MRI and MRA head and neck showed R cerebellar, L thalamic and bilateral pontine infarcts, and occluded L VA, etiology felt to be embolic secondary to cardiembolic source vs VA dissection. Stroke work up including 2D echo and LE venous doppler negative. However, he was not able to have TEE due to schedule issue. His TG was 661 and LDL not able to calculate. Symptoms much improved. He was put on dural antiplatelet and fenofibrate on discharge.   Interval History During the interval time, the patient has been doing well. All symptoms resolved. However, he is still smoking, 6 cigs per day. Still complain of left shoulder and knee pain. He has PCP appointment on 03/19/15. He has completed 3 months of dual antiplatelet.  REVIEW OF SYSTEMS: Full 14 system review of systems performed and notable only for those listed below and in HPI above, all others are negative:  Constitutional:   Cardiovascular: murmur, swelling in legs Ear/Nose/Throat:   Skin: moles Eyes:   Respiratory:  Wheezing, snoring Gastroitestinal:   Genitourinary:  Hematology/Lymphatic:  Easy bruising Endocrine: increased thirst Musculoskeletal:  Joint pain, joint swelling, cramps, aching muscles Allergy/Immunology:   Neurological:   Psychiatric: anxiety Sleep: insomnia, snoring, restless legs  The following represents the patient's updated allergies and side effects list: Allergies  Allergen Reactions  . Asa [Aspirin] Other (See Comments)  . Ibuprofen Other (See Comments)  . Tylenol [Acetaminophen] Other (See Comments)    The  neurologically relevant items on the patient's problem list were reviewed on today's visit.  Neurologic Examination  A problem focused neurological exam (12 or more points of the single system neurologic examination, vital signs counts as 1 point, cranial nerves count for 8 points) was performed.  Blood pressure 113/73, pulse 83, height  (1.753 m), weight 217 lb 3.2 oz (98.521 kg).  General - Well nourished, well developed, in no apparent distress.  Ophthalmologic - Sharp disc margins OU.   Cardiovascular - Regular rate and rhythm.  Mental Status -  Level of arousal and orientation to time, place, and person were intact. Language including expression, naming, repetition, comprehension was assessed and found intact. Fund of Knowledge was assessed and was intact.  Cranial Nerves II - XII - II - Visual field intact OU. III, IV, VI - Extraocular movements intact. V - Facial sensation intact bilaterally. VII - Facial movement intact bilaterally. VIII - Hearing & vestibular intact bilaterally. X - Palate elevates symmetrically. XI - Chin turning & shoulder shrug intact bilaterally. XII - Tongue protrusion intact.  Motor Strength - The patient's strength was normal in all extremities and pronator drift was absent.  Bulk was normal and fasciculations were absent.   Motor Tone - Muscle tone was assessed at the neck and appendages and was normal.  Reflexes - The patient's reflexes were 1+ in all extremities and he had no pathological reflexes.  Sensory - Light touch, temperature/pinprick were assessed and were normal.    Coordination - The patient had normal movements in the hands and feet with no ataxia or dysmetria.  Tremor was absent.  Gait  and Station - The patient's transfers, posture, gait, station, and turns were observed as normal.  Data reviewed: I personally reviewed the images and agree with the radiology interpretations.  Ct Head Wo Contrast 11/07/2014 Acute  nonhemorrhagic RIGHT cerebellar infarctions as described. RIGHT vertebral and/or basilar dissection should be excluded. MRI brain with MRA intracranial and extracranial circulation is recommended for further evaluation, if no contraindications.   Mr Brain Wo Contrast 11/07/2014 Scattered areas of acute infarct right superior cerebellum. Small areas of acute infarct in the pons bilaterally and left thalamus.   Mr Maxine Glenn Head Wo Contrast 11/07/2014 Occlusion left vertebral artery proximally with reconstitution via PICA collaterals. Otherwise negative.   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 There is reconstitution distally via left PICA collaterals. Normal right vertebral. Normal carotid artery bilaterally.   2D Echocardiogram  - Left ventricle: Technically difficult study. The cavity size wasnormal. Wall thickness was increased in a pattern of mild LVH.The estimated ejection fraction was 60%. Wall motion was normal;there were no regional wall motion abnormalities. - Right ventricle: The cavity size was normal. Systolic functionwas normal. Impressions: No cardiac source of embolism was identified, butcannot be ruled out on the basis of this examination.  LE Venous doppler - negative for DVT  Component     Latest Ref Rng 11/08/2014  Cholesterol     0 - 200 mg/dL   Triglycerides     <161 mg/dL   HDL Cholesterol     >09 mg/dL   Total CHOL/HDL Ratio        VLDL     0 - 40 mg/dL   LDL (calc)     0 - 99 mg/dL   Hemoglobin U0A     4.8 - 5.6 %   Mean Plasma Glucose        Vitamin B-12     180 - 914 pg/mL 420  TSH     0.350 - 4.500 uIU/mL 1.843   Component     Latest Ref Rng 11/09/2014  Cholesterol     0 - 200 mg/dL 540 (H)  Triglycerides     <150 mg/dL 981 (H)  HDL Cholesterol     >40 mg/dL 28 (L)  Total CHOL/HDL Ratio      7.3  VLDL     0 - 40 mg/dL UNABLE TO CALCULATE IF TRIGLYCERIDE OVER  400 mg/dL  LDL (calc)     0 - 99 mg/dL UNABLE TO CALCULATE IF TRIGLYCERIDE OVER 400 mg/dL  Hemoglobin X9J     4.8 - 5.6 % 5.8 (H)  Mean Plasma Glucose      120  Vitamin B-12     180 - 914 pg/mL   TSH     0.350 - 4.500 uIU/mL     Assessment: As you may recall, he is a 54 y.o. Caucasian male with PMH of smoking admitted on 11/07/14 for R cerebellar, L thalamic and bilateral pontine infarcts on MRI, and occluded L VA on MRA head and neck, etiology felt to be embolic secondary to cardiembolic source vs. VA dissection. Stroke work up including 2D echo and LE venous doppler negative. His TG was 661 and LDL not able to calculate. Symptoms much improved. He was put on dural antiplatelet and fenofibrate on discharge. During the interval time, he is doing well. However, continue to smoke. Will do 30 day cardiac event monitoring to rule out afib.  Plan:  - continue plavix for stroke prevention - discontinue  ASA - Add lipitor for stroke prevention and HLD - continue fenofibrate for high TG - 30 day cardiac event monitoring to rule out afib. - follow up with Dr. Lysbeth Galas on 03/19/15 and repeat lipid panel - Follow up with your primary care physician for stroke risk factor modification. Recommend maintain blood pressure goal <130/80, diabetes with hemoglobin A1c goal below 6.5% and lipids with LDL cholesterol goal below 70 mg/dL.  - quit smoking completely - RTC in 4 months  Orders Placed This Encounter  Procedures  . Cardiac event monitor    Standing Status: Future     Number of Occurrences:      Standing Expiration Date: 03/05/2016    Scheduling Instructions:     Request cardionet setup. Thank you.    Order Specific Question:  Where should this test be performed?    Answer:  [facility]    Meds ordered this encounter  Medications  . meloxicam (MOBIC) 15 MG tablet    Sig: TAKE ONE TABLET BY MOUTH ONE TIME DAILY  . atorvastatin (LIPITOR) 20 MG tablet    Sig: Take 1 tablet (20 mg total) by  mouth daily.    Dispense:  30 tablet    Refill:  3    Patient Instructions  - continue plavix for stroke prevention - discontinue ASA now - will prescribe lipitor for stroke prevention and high cholesterol - continue fenofibrate for high cholesterol - follow up with Dr. Lysbeth Galas next month and repeat cholesterol - Follow up with your primary care physician for stroke risk factor modification. Recommend maintain blood pressure goal <130/80, diabetes with hemoglobin A1c goal below 6.5% and lipids with LDL cholesterol goal below 70 mg/dL.  - quit smoking completely - follow up in 4 months    Marvel Plan, MD PhD [facility] [address] [place], [place] [address] [phone]

## 2015-03-05 NOTE — Patient Instructions (Signed)
-   continue plavix for stroke prevention - discontinue ASA now - will prescribe lipitor for stroke prevention and high cholesterol - continue fenofibrate for high cholesterol - follow up with Dr. Lysbeth Galas next month and repeat cholesterol - Follow up with your primary care physician for stroke risk factor modification. Recommend maintain blood pressure goal <130/80, diabetes with hemoglobin A1c goal below 6.5% and lipids with LDL cholesterol goal below 70 mg/dL.  - quit smoking completely - follow up in 4 months

## 2015-03-06 DIAGNOSIS — I6509 Occlusion and stenosis of unspecified vertebral artery: Secondary | ICD-10-CM | POA: Insufficient documentation

## 2015-03-06 DIAGNOSIS — F17200 Nicotine dependence, unspecified, uncomplicated: Secondary | ICD-10-CM | POA: Insufficient documentation

## 2015-03-06 DIAGNOSIS — E785 Hyperlipidemia, unspecified: Secondary | ICD-10-CM | POA: Insufficient documentation

## 2015-03-22 ENCOUNTER — Other Ambulatory Visit: Payer: Self-pay | Admitting: Neurology

## 2015-03-22 DIAGNOSIS — I6350 Cerebral infarction due to unspecified occlusion or stenosis of unspecified cerebral artery: Secondary | ICD-10-CM

## 2015-03-22 DIAGNOSIS — R002 Palpitations: Secondary | ICD-10-CM

## 2015-03-22 DIAGNOSIS — I4891 Unspecified atrial fibrillation: Secondary | ICD-10-CM

## 2015-03-26 ENCOUNTER — Ambulatory Visit (INDEPENDENT_AMBULATORY_CARE_PROVIDER_SITE_OTHER): Payer: Medicaid Other

## 2015-03-26 DIAGNOSIS — I4891 Unspecified atrial fibrillation: Secondary | ICD-10-CM | POA: Diagnosis not present

## 2015-03-26 DIAGNOSIS — R002 Palpitations: Secondary | ICD-10-CM

## 2015-03-26 DIAGNOSIS — I6350 Cerebral infarction due to unspecified occlusion or stenosis of unspecified cerebral artery: Secondary | ICD-10-CM | POA: Diagnosis not present

## 2015-05-07 ENCOUNTER — Telehealth: Payer: Self-pay

## 2015-05-07 NOTE — Telephone Encounter (Signed)
Marvel PlanJindong Xu, MD  Hildred AlaminKatrina Y Laquanda Bick, RN            Hi, Ian [name]:   Please let the pt know that his 30 day cardiac monitoring did not show afib episode. Please continue the current treatment plan. Thanks.   Marvel PlanJindong Xu, MD PhD  Stroke Neurology  05/02/2015  10:10 AM        Rn call patient to notify him that his 30 day cardiac monitoring did not show any atrial fibrillation episode. Pt verbalized understanding of the test results.

## 2015-05-08 NOTE — Telephone Encounter (Signed)
This encounter was created in error - please disregard.

## 2015-05-08 NOTE — Telephone Encounter (Signed)
-----   Message from Marvel PlanJindong Xu, MD sent at 05/08/2015  1:58 PM EST ----- Hi, Katrina:  Could you please let the pt know that the cardiac event monitoring did not show afib episode. Continue current management without changes. Thanks.  Marvel PlanJindong Xu, MD PhD Stroke Neurology 05/08/2015 1:58 PM

## 2015-05-29 ENCOUNTER — Telehealth: Payer: Self-pay

## 2015-05-29 MED ORDER — TRICOR 145 MG PO TABS: 145.0000 mg | ORAL_TABLET | Freq: Every day | ORAL | Status: DC

## 2015-05-29 NOTE — Telephone Encounter (Signed)
Medicaid will not pay for Fenofibrate, but will pay for name brand Tricor. New rx sent to his pharmacy.

## 2015-07-06 ENCOUNTER — Other Ambulatory Visit: Payer: Self-pay | Admitting: Neurology

## 2015-07-09 ENCOUNTER — Encounter: Payer: Self-pay | Admitting: Neurology

## 2015-07-09 ENCOUNTER — Ambulatory Visit (INDEPENDENT_AMBULATORY_CARE_PROVIDER_SITE_OTHER): Payer: Medicaid Other | Admitting: Neurology

## 2015-07-09 VITALS — BP 123/76 | HR 72 | Ht 69.0 in | Wt 223.2 lb

## 2015-07-09 DIAGNOSIS — I6509 Occlusion and stenosis of unspecified vertebral artery: Secondary | ICD-10-CM

## 2015-07-09 DIAGNOSIS — I6350 Cerebral infarction due to unspecified occlusion or stenosis of unspecified cerebral artery: Secondary | ICD-10-CM | POA: Diagnosis not present

## 2015-07-09 DIAGNOSIS — F17200 Nicotine dependence, unspecified, uncomplicated: Secondary | ICD-10-CM

## 2015-07-09 DIAGNOSIS — E785 Hyperlipidemia, unspecified: Secondary | ICD-10-CM | POA: Diagnosis not present

## 2015-07-09 MED ORDER — ATORVASTATIN CALCIUM 20 MG PO TABS: 20.0000 mg | ORAL_TABLET | Freq: Every day | ORAL | Status: DC

## 2015-07-09 NOTE — Patient Instructions (Addendum)
-   continue plavix and lipitor for stroke prevention - continue fenofibrate for high cholesterol - follow up with Dr. Lysbeth Galas next month and repeat cholesterol - Follow up with your primary care physician for stroke risk factor modification. Recommend maintain blood pressure goal <130/80, diabetes with hemoglobin A1c goal below 6.5% and lipids with LDL cholesterol goal below 70 mg/dL.  - quit smoking completely - follow up in 6 months

## 2015-07-10 NOTE — Progress Notes (Signed)
STROKE NEUROLOGY FOLLOW UP NOTE  NAME: Ian [name] DOB: 03/26/1961  REASON FOR VISIT: stroke follow up HISTORY FROM: pt and chart  Today we had the pleasure of seeing BIRCH FARINO in follow-up at our Neurology Clinic. Pt was accompanied by wife.   History Summary Mr. Ian [name] is a 55 y.o. male with history of tobacco abuse was admitted on 11/07/14 for dizziness and vertigo. MRI and MRA head and neck showed R cerebellar, L thalamic and bilateral pontine infarcts, and occluded L VA, etiology felt to be embolic secondary to cardiembolic source vs VA dissection. Stroke work up including 2D echo and LE venous doppler negative. However, he was not able to have TEE due to schedule issue. His TG was 661 and LDL not able to calculate. Symptoms much improved. He was put on dural antiplatelet and fenofibrate on discharge.   03/05/15 follow up - the patient has been doing well. All symptoms resolved. However, he is still smoking, 6 cigs per day. Still complain of left shoulder and knee pain. He has PCP appointment on 03/19/15. He has completed 3 months of dual antiplatelet.  Interval History During the interval time, pt has been doing well. No stroke like symptoms. Cut smoking now to 4 cigs per day, still not completely yet. For some reason, although we ordered lipitor last time, he did not get the medication at all. Will reorder this time. He is on tricor for high TG and followed with his PCP 3-4 months ago. Had repeat lipid panel, will request results. No complains. BP 123/76 in clinic today. Had 30 day cardiac monitoring which was negative for afib.  REVIEW OF SYSTEMS: Full 14 system review of systems performed and notable only for those listed below and in HPI above, all others are negative:  Constitutional:   Cardiovascular:  Ear/Nose/Throat:   Skin: moles Eyes:   Respiratory:   Gastroitestinal:   Genitourinary:  Hematology/Lymphatic:   Endocrine:  Musculoskeletal:     Allergy/Immunology:   Neurological:   Psychiatric:  Sleep:   The following represents the patient's updated allergies and side effects list: Allergies  Allergen Reactions  . Asa [Aspirin] Other (See Comments)  . Ibuprofen Other (See Comments)  . Tylenol [Acetaminophen] Other (See Comments)    The neurologically relevant items on the patient's problem list were reviewed on today's visit.  Neurologic Examination  A problem focused neurological exam (12 or more points of the single system neurologic examination, vital signs counts as 1 point, cranial nerves count for 8 points) was performed.  Blood pressure 123/76, pulse 72, height  (1.753 m), weight 223 lb 3.2 oz (101.243 kg).  General - Well nourished, well developed, in no apparent distress.  Ophthalmologic - Sharp disc margins OU.   Cardiovascular - Regular rate and rhythm.  Mental Status -  Level of arousal and orientation to time, place, and person were intact. Language including expression, naming, repetition, comprehension was assessed and found intact. Fund of Knowledge was assessed and was intact.  Cranial Nerves II - XII - II - Visual field intact OU. III, IV, VI - Extraocular movements intact. V - Facial sensation intact bilaterally. VII - Facial movement intact bilaterally. VIII - Hearing & vestibular intact bilaterally. X - Palate elevates symmetrically. XI - Chin turning & shoulder shrug intact bilaterally. XII - Tongue protrusion intact.  Motor Strength - The patient's strength was normal in all extremities and pronator drift was absent.  Bulk was normal and fasciculations were  absent.   Motor Tone - Muscle tone was assessed at the neck and appendages and was normal.  Reflexes - The patient's reflexes were 1+ in all extremities and he had no pathological reflexes.  Sensory - Light touch, temperature/pinprick were assessed and were normal.    Coordination - The patient had normal movements in the  hands and feet with no ataxia or dysmetria.  Tremor was absent.  Gait and Station - The patient's transfers, posture, gait, station, and turns were observed as normal.  Data reviewed: I personally reviewed the images and agree with the radiology interpretations.  Ct Head Wo Contrast 11/07/2014 Acute nonhemorrhagic RIGHT cerebellar infarctions as described. RIGHT vertebral and/or basilar dissection should be excluded. MRI brain with MRA intracranial and extracranial circulation is recommended for further evaluation, if no contraindications.   Mr Brain Wo Contrast 11/07/2014 Scattered areas of acute infarct right superior cerebellum. Small areas of acute infarct in the pons bilaterally and left thalamus.   Mr Maxine Glenn Head Wo Contrast 11/07/2014 Occlusion left vertebral artery proximally with reconstitution via PICA collaterals. Otherwise negative.   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 There is reconstitution distally via left PICA collaterals. Normal right vertebral. Normal carotid artery bilaterally.   2D Echocardiogram  - Left ventricle: Technically difficult study. The cavity size wasnormal. Wall thickness was increased in a pattern of mild LVH.The estimated ejection fraction was 60%. Wall motion was normal;there were no regional wall motion abnormalities. - Right ventricle: The cavity size was normal. Systolic functionwas normal. Impressions: No cardiac source of embolism was identified, butcannot be ruled out on the basis of this examination.  LE Venous doppler - negative for DVT  30 day cardiac monitoring - neg for afib.  Component     Latest Ref Rng 11/08/2014  Cholesterol     0 - 200 mg/dL   Triglycerides     <086 mg/dL   HDL Cholesterol     >57 mg/dL   Total CHOL/HDL Ratio        VLDL     0 - 40 mg/dL   LDL (calc)     0 - 99 mg/dL   Hemoglobin Q4O     4.8 - 5.6 %   Mean Plasma  Glucose        Vitamin B-12     180 - 914 pg/mL 420  TSH     0.350 - 4.500 uIU/mL 1.843   Component     Latest Ref Rng 11/09/2014  Cholesterol     0 - 200 mg/dL 962 (H)  Triglycerides     <150 mg/dL 952 (H)  HDL Cholesterol     >40 mg/dL 28 (L)  Total CHOL/HDL Ratio      7.3  VLDL     0 - 40 mg/dL UNABLE TO CALCULATE IF TRIGLYCERIDE OVER 400 mg/dL  LDL (calc)     0 - 99 mg/dL UNABLE TO CALCULATE IF TRIGLYCERIDE OVER 400 mg/dL  Hemoglobin W4X     4.8 - 5.6 % 5.8 (H)  Mean Plasma Glucose      120  Vitamin B-12     180 - 914 pg/mL   TSH     0.350 - 4.500 uIU/mL     Assessment: As you may recall, he is a 55 y.o. Caucasian male with PMH of smoking admitted on 11/07/14 for R cerebellar, L thalamic and bilateral pontine infarcts on MRI, and occluded L VA on MRA head and  neck, etiology felt to be embolic secondary to cardiembolic source vs. VA dissection. Stroke work up including 2D echo and LE venous doppler negative. His TG was 661 and LDL not able to calculate. Symptoms much improved. He was put on dural antiplatelet and fenofibrate on discharge. During the interval time, finished DAPT course and now on plavix. 30 day cardiac event monitoring showed no afib. he is doing well. However, not completely quit smoking yet. Did not get lipitor due to unclear reason.    Plan:  - continue plavix and lipitor for stroke prevention - continue fenofibrate for high TG - follow up with Dr. Lysbeth Galas next month and recommend to repeat lipid panel - Follow up with your primary care physician for stroke risk factor modification. Recommend maintain blood pressure goal <130/80, diabetes with hemoglobin A1c goal below 6.5% and lipids with LDL cholesterol goal below 70 mg/dL.  - quit smoking completely - follow up in 6 months  No orders of the defined types were placed in this encounter.    Meds ordered this encounter  Medications  . pantoprazole (PROTONIX) 40 MG tablet    Sig: Take 40 mg by mouth  daily.  Marland Kitchen atorvastatin (LIPITOR) 20 MG tablet    Sig: Take 1 tablet (20 mg total) by mouth daily.    Dispense:  30 tablet    Refill:  5    Patient Instructions  - continue plavix and lipitor for stroke prevention - continue fenofibrate for high cholesterol - follow up with Dr. Lysbeth Galas next month and repeat cholesterol - Follow up with your primary care physician for stroke risk factor modification. Recommend maintain blood pressure goal <130/80, diabetes with hemoglobin A1c goal below 6.5% and lipids with LDL cholesterol goal below 70 mg/dL.  - quit smoking completely - follow up in 6 months    Marvel Plan, MD PhD [facility] [address], [address] [place], [place] [address] [phone]

## 2016-01-07 ENCOUNTER — Ambulatory Visit: Payer: Medicaid Other | Admitting: Neurology

## 2016-01-10 ENCOUNTER — Encounter: Payer: Self-pay | Admitting: Nurse Practitioner

## 2016-01-10 ENCOUNTER — Ambulatory Visit (INDEPENDENT_AMBULATORY_CARE_PROVIDER_SITE_OTHER): Payer: Medicaid Other | Admitting: Nurse Practitioner

## 2016-01-10 VITALS — BP 107/63 | HR 66 | Ht 69.0 in | Wt 221.8 lb

## 2016-01-10 DIAGNOSIS — I6509 Occlusion and stenosis of unspecified vertebral artery: Secondary | ICD-10-CM

## 2016-01-10 DIAGNOSIS — E785 Hyperlipidemia, unspecified: Secondary | ICD-10-CM

## 2016-01-10 DIAGNOSIS — Z720 Tobacco use: Secondary | ICD-10-CM | POA: Diagnosis not present

## 2016-01-10 DIAGNOSIS — I6350 Cerebral infarction due to unspecified occlusion or stenosis of unspecified cerebral artery: Secondary | ICD-10-CM

## 2016-01-10 MED ORDER — ATORVASTATIN CALCIUM 20 MG PO TABS: 20.0000 mg | ORAL_TABLET | Freq: Every day | ORAL | 6 refills | Status: DC

## 2016-01-10 NOTE — Progress Notes (Signed)
[facility]  PATIENT: Ian [name] DOB: 1960/07/26   REASON FOR VISIT: Follow up for stroke HISTORY FROM: Patient    HISTORY OF PRESENT ILLNESS History SummaryMr. Ian [name] is a 55 y.o. male with history of tobacco abuse was admitted on 11/07/14 for dizziness and vertigo. MRI and MRA head and neck showed R cerebellar, L thalamic and bilateral pontine infarcts, and occluded L VA, etiology felt to be embolic secondary to cardiembolic source vs VA dissection. Stroke work up including 2D echo and LE venous doppler negative. However, he was not able to have TEE due to schedule issue. His TG was 661 and LDL not able to calculate. Symptoms much improved. He was put on dural antiplatelet and fenofibrate on discharge.   03/05/15 follow up Dr. Roda Shutters- the patient has been doing well. All symptoms resolved. However, he is still smoking, 6 cigs per day. Still complain of left shoulder and knee pain. He has PCP appointment on 03/19/15. He has completed 3 months of dual antiplatelet.  Interval History1/23/17 Dr. Roda Shutters During the interval time, pt has been doing well. No stroke like symptoms. Cut smoking now to 4 cigs per day, still not completely yet. For some reason, although we ordered lipitor last time, he did not get the medication at all. Will reorder this time. He is on tricor for high TG and followed with his PCP 3-4 months ago. Had repeat lipid panel, will request results. No complains. BP 123/76 in clinic today. Had 30 day cardiac monitoring which was negative for afib. : UPDATE 07/27/2017[name], 55 year old male returns for follow-up he has a history of stroke event 5/24 /2016.  He is currently on Plavix for secondary stroke prevention. He has not had further stroke or TIA symptoms since original admission. Recent labs 10/22/15 at primary care  total cholesterol 159 triglycerides 178 and LDL 91. He is currently on Lipitor and TriCor. Blood pressure today in the office as  well controlled at 107/63.  He continues to smoke 4-5 cigarettes a day . He returns for reevaluation.  REVIEW OF SYSTEMS: Full 14 system review of systems performed and notable only for those listed, all others are neg:  Constitutional: neg  Cardiovascular: neg Ear/Nose/Throat: neg  Skin: neg Eyes: neg Respiratory: neg Gastroitestinal: neg  Hematology/Lymphatic: neg  Endocrine: neg Musculoskeletal:neg Allergy/Immunology: neg Neurological: neg Psychiatric: neg Sleep : neg   ALLERGIES: Allergies  Allergen Reactions  . Asa [Aspirin] Other (See Comments)  . Ibuprofen Other (See Comments)  . Tylenol [Acetaminophen] Other (See Comments)    HOME MEDICATIONS: Outpatient Medications Prior to Visit  Medication Sig Dispense Refill  . ALPRAZolam (XANAX) 1 MG tablet Take 1 mg by mouth 3 (three) times daily as needed for anxiety.     Marland Kitchen atorvastatin (LIPITOR) 20 MG tablet Take 1 tablet (20 mg total) by mouth daily. 30 tablet 5  . clopidogrel (PLAVIX) 75 MG tablet Take 1 tablet (75 mg total) by mouth daily. 30 tablet 0  . pantoprazole (PROTONIX) 40 MG tablet Take 40 mg by mouth daily.    . TRICOR 145 MG tablet Take 1 tablet (145 mg total) by mouth daily. 30 tablet 6  . Vitamin D, Ergocalciferol, (DRISDOL) 50000 UNITS CAPS capsule TAKE ONE CAPSULE BY MOUTH WEEKLY    . gabapentin (NEURONTIN) 100 MG capsule Take 300 mg by mouth 3 (three) times daily. TAKE 1 CAPSULE (100 MG TOTAL) BY MOUTH 3 (THREE) TIMES A DAY.     No facility-administered medications  prior to visit.     PAST MEDICAL HISTORY: Past Medical History:  Diagnosis Date  . Hx of cardiovascular stress test    Lexiscan Myoview (09/2013):  No ischemia, EF 62%; NORMAL  . Leg pain    ABIs (09/29/13):  Normal.  . Stomach ulcer   . Stroke (HCC)   . Tobacco abuse     PAST SURGICAL HISTORY: Past Surgical History:  Procedure Laterality Date  . ANKLE FRACTURE SURGERY    . APPENDECTOMY      FAMILY HISTORY: Family History    Problem Relation Age of Onset  . CAD Father 20    Died of MI  . CAD Paternal Grandfather 63    Died of MI  . Heart failure Mother 82    Alive  . Diabetes Mother   . Heart disease Daughter     Rapid heart rate  . Diabetes Sister     SOCIAL HISTORY: Social History   Social History  . Marital status: Divorced    Spouse name: N/A  . Number of children: 5  . Years of education: N/A   Occupational History  .  Not Employed   Social History Main Topics  . Smoking status: Current Every Day Smoker    Packs/day: 1.00    Years: 35.00    Types: Cigarettes  . Smokeless tobacco: Never Used     Comment: smokes 4 cigarettes a day  . Alcohol use 0.6 oz/week    1 Glasses of wine per week     Comment: Occassional Use  . Drug use: No  . Sexual activity: Not on file   Other Topics Concern  . Not on file   Social History Narrative   Lives with      PHYSICAL EXAM  Vitals:   01/10/16 0919  BP: 107/63  Pulse: 66  Weight: 221 lb 12.8 oz (100.6 kg)  Height:  (1.753 m)   Body mass index is 32.75 kg/m.  Generalized: Well developed, in no acute distress  Head: normocephalic and atraumatic,. Oropharynx benign  Neck: Supple, no carotid bruits  Cardiac: Regular rate rhythm, no murmur  Musculoskeletal: No deformity   Neurological examination   Mentation: Alert oriented to time, place, history taking. Attention span and concentration appropriate. Recent and remote memory intact.  Follows all commands speech and language fluent.   Cranial nerve II-XII: Pupils were equal round reactive to light extraocular movements were full, visual field were full on confrontational test. Facial sensation and strength were normal. hearing was intact to finger rubbing bilaterally. Uvula tongue midline. head turning and shoulder shrug were normal and symmetric.Tongue protrusion into cheek strength was normal. Motor: normal bulk and tone, full strength in the BUE, BLE, fine finger movements  normal, no pronator drift. No focal weakness Sensory: normal and symmetric to light touch, pinprick, and  Vibration In the upper and lower extremities except decreased pinprick to left arm and left leg,  Coordination: finger-nose-finger, heel-to-shin bilaterally, no dysmetria Reflexes: 1+ upper lower and symmetric plantar responses were flexor bilaterally. Gait and Station: Rising up from seated position without assistance, normal stance,  moderate stride, good arm swing, smooth turning, able to perform tiptoe, and heel walking without difficulty. Tandem gait is steady  DIAGNOSTIC DATA (LABS, IMAGING, TESTING) -  Reviewed labs 10/22/15  from [facility] CBC, CMP, Lipid panel HgbA1C   ASSESSMENT AND PLAN 55 y.o. Caucasian male with PMH of smoking admitted on 11/07/14 for R cerebellar, L thalamic and bilateral pontine infarcts  on MRI, and occluded L VA on MRA head and neck, etiology felt to be embolic secondary to cardiembolic source vs. VA dissection. Stroke work up including 2D echo and LE venous doppler negative. His TG was 661 and LDL not able to calculate. Symptoms much improved. He was put on dural antiplatelet and fenofibrate on discharge. During the interval time, finished DAPT course and now on plavix. 30 day cardiac event monitoring showed no afib. he is doing well. However, not completely quit smoking yet.    PLAN: Keep systolic blood pressure less than 130, today's reading 107.63 Lipids are followed by Dr. Lysbeth Galas  last cholesterol 10/22/15 was  159,LDL 91 and TG 178 continue TriCor and Lipitor. Will refill Lipitor for 6 month and then can be refilled by Dr. Lysbeth Galas Continue Plavix for secondary stroke prevention Hemoglobin A1c 5.8 No further stroke or TIA symptoms since 11/07/14 Need to Quit smoking completely If recurrent stroke symptoms occur, call 911 and proceed to the hospital Moderate exercise for overall health and well-being healthy diet low-fat Discharge from neurologic services  at this time Additional questions answered for patient Vst time 25 min Nilda Riggs, [facility], [facility], APRN  [facility] [address], [address] [place], [place] [address] [phone]

## 2016-01-10 NOTE — Patient Instructions (Addendum)
Keep systolic blood pressure less than 130, today's reading 107.63 Lipids are followed by Dr. Lysbeth Galas  last cholesterol 10/22/15 was  159,LDL 91 and TG 178 continue TriCor and Lipitor. Will refill Lipitor for 6 month and can be refilled by Dr. Lysbeth Galas Continue Plavix for secondary stroke prevention Hemoglobin A1c 5.8 No further stroke or TIA symptoms since 11/07/14 Quit smoking completely If recurrent stroke symptoms occur, call 911 and proceed to the hospital Moderate exercise for overall health and well-being healthy diet low-fat Discharge from neurologic services at this time

## 2016-01-11 NOTE — Progress Notes (Signed)
I reviewed above note and agree with the assessment and plan.  Marvel Plan, MD PhD Stroke Neurology 01/11/2016 8:00 PM

## 2016-08-19 ENCOUNTER — Other Ambulatory Visit: Payer: Self-pay | Admitting: Nurse Practitioner

## 2016-08-19 DIAGNOSIS — E785 Hyperlipidemia, unspecified: Secondary | ICD-10-CM

## 2016-10-05 ENCOUNTER — Encounter (HOSPITAL_COMMUNITY): Payer: Self-pay | Admitting: Emergency Medicine

## 2016-10-05 ENCOUNTER — Emergency Department (HOSPITAL_COMMUNITY)
Admission: EM | Admit: 2016-10-05 | Discharge: 2016-10-05 | Disposition: A | Discharge status: Home / Self Care | Payer: Medicaid Other | Attending: Emergency Medicine | Admitting: Emergency Medicine

## 2016-10-05 ENCOUNTER — Emergency Department (HOSPITAL_COMMUNITY): Payer: Medicaid Other

## 2016-10-05 DIAGNOSIS — Y9301 Activity, walking, marching and hiking: Secondary | ICD-10-CM | POA: Diagnosis not present

## 2016-10-05 DIAGNOSIS — S52124A Nondisplaced fracture of head of right radius, initial encounter for closed fracture: Secondary | ICD-10-CM

## 2016-10-05 DIAGNOSIS — Y929 Unspecified place or not applicable: Secondary | ICD-10-CM | POA: Diagnosis not present

## 2016-10-05 DIAGNOSIS — R51 Headache: Secondary | ICD-10-CM | POA: Insufficient documentation

## 2016-10-05 DIAGNOSIS — Y999 Unspecified external cause status: Secondary | ICD-10-CM | POA: Insufficient documentation

## 2016-10-05 DIAGNOSIS — W01198A Fall on same level from slipping, tripping and stumbling with subsequent striking against other object, initial encounter: Secondary | ICD-10-CM | POA: Insufficient documentation

## 2016-10-05 DIAGNOSIS — Z79899 Other long term (current) drug therapy: Secondary | ICD-10-CM | POA: Diagnosis not present

## 2016-10-05 DIAGNOSIS — F17210 Nicotine dependence, cigarettes, uncomplicated: Secondary | ICD-10-CM | POA: Insufficient documentation

## 2016-10-05 DIAGNOSIS — S6991XA Unspecified injury of right wrist, hand and finger(s), initial encounter: Secondary | ICD-10-CM | POA: Diagnosis present

## 2016-10-05 MED ORDER — OXYCODONE HCL 5 MG PO TABS: 5.0000 mg | ORAL_TABLET | ORAL | 0 refills | Status: DC | PRN

## 2016-10-05 MED ORDER — HYDROCODONE-ACETAMINOPHEN 5-325 MG PO TABS
1.0000 | ORAL_TABLET | Freq: Once | ORAL | Status: DC
  Filled 2016-10-05: qty 1

## 2016-10-05 MED ORDER — TRAMADOL HCL 50 MG PO TABS: 50.0000 mg | ORAL_TABLET | Freq: Once | ORAL | Status: DC

## 2016-10-05 MED ORDER — OXYCODONE HCL 5 MG PO TABS
5.0000 mg | ORAL_TABLET | Freq: Once | ORAL | Status: AC
  Administered 2016-10-05: 5 mg via ORAL
  Filled 2016-10-05: qty 1

## 2016-10-05 NOTE — Discharge Instructions (Signed)
Please keep splint dry. Please call Dr. Doroteo Glassman office for follow-up regarding this injury.  Please return without fail for worsening symptoms, including escalating pain, new numbness or weakness, skin discoloration, or any other symptoms concerning to you.

## 2016-10-05 NOTE — ED Provider Notes (Signed)
[facility]Y DEPT Provider Note   CSN: [id number] Arrival date & time: 10/05/16  4098     History   Chief Complaint Chief Complaint  Patient presents with  . Elbow Pain    HPI Ian [name] is a 56 y.o. male.  The history is provided by the patient.  Fall  This is a new problem. The current episode started more than 2 days ago. The problem occurs rarely. The problem has not changed since onset.Associated symptoms include headaches. Pertinent negatives include no chest pain, no abdominal pain and no shortness of breath. Exacerbated by: movement of right arm. Nothing relieves the symptoms. He has tried nothing for the symptoms.   56 year old male with prior CVA and peptic ulcer disease who presents with right elbow pain. He had a mechanical fall 3-4 days ago while he was walking down steps. States that he slipped and tried to catch his fall with the right hand. His right elbow struck the banister, and since then he has had gradually worsening right elbow swelling and pain. He is not taking any medications for his pain. Pain is worse with movement and palpation. He did not hit his head or have loss of consciousness. Denies any other injuries.   Past Medical History:  Diagnosis Date  . Hx of cardiovascular stress test    Lexiscan Myoview (09/2013):  No ischemia, EF 62%; NORMAL  . Leg pain    ABIs (09/29/13):  Normal.  . Stomach ulcer   . Stroke (HCC)   . Tobacco abuse     Patient Active Problem List   Diagnosis Date Noted  . 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 HLD (hyperlipidemia) 03/06/2015  . Tobacco use disorder 03/06/2015  . Palpitations 03/05/2015  . 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 Tobacco abuse 11/07/2014  . Chest pain 08/20/2013  . No significant medical problems     Past Surgical History:  Procedure Laterality Date  . ANKLE FRACTURE SURGERY    . APPENDECTOMY         Home Medications    Prior to Admission medications   Medication  Sig Start Date End Date Taking? Authorizing Provider  ALPRAZolam Prudy Feeler) 1 MG tablet Take 1 mg by mouth 3 (three) times daily as needed for anxiety.     Historical Provider, MD  atorvastatin (LIPITOR) 20 MG tablet Take 1 tablet (20 mg total) by mouth daily. 01/10/16   Nilda Riggs, NP  clopidogrel (PLAVIX) 75 MG tablet Take 1 tablet (75 mg total) by mouth daily. 11/09/14   Belkys A Regalado, MD  gabapentin (NEURONTIN) 300 MG capsule Take 300 mg by mouth 3 (three) times daily. 12/20/15   Historical Provider, MD  meloxicam (MOBIC) 15 MG tablet Take 15 mg by mouth daily.    Historical Provider, MD  oxyCODONE (ROXICODONE) 5 MG immediate release tablet Take 1 tablet (5 mg total) by mouth every 4 (four) hours as needed for moderate pain or severe pain. 10/05/16   Lavera Guise, MD  pantoprazole (PROTONIX) 40 MG tablet Take 40 mg by mouth daily.    Historical Provider, MD  TRICOR 145 MG tablet Take 1 tablet (145 mg total) by mouth daily. 05/29/15   Rollene Rotunda, MD  Vitamin D, Ergocalciferol, (DRISDOL) 50000 UNITS CAPS capsule TAKE ONE CAPSULE BY MOUTH WEEKLY 01/31/14   Historical Provider, MD    Family History Family History  Problem Relation Age of Onset  . CAD Father 51    Died of MI  . Heart  failure Mother 75    Alive  . Diabetes Mother   . Diabetes Sister   . CAD Paternal Grandfather 25    Died of MI  . Heart disease Daughter     Rapid heart rate    Social History Social History  Substance Use Topics  . Smoking status: Current Every Day Smoker    Packs/day: 1.00    Years: 35.00    Types: Cigarettes  . Smokeless tobacco: Never Used     Comment: smokes 4 cigarettes a day  . Alcohol use No     Allergies   Asa [aspirin]; Ibuprofen; and Tylenol [acetaminophen]   Review of Systems Review of Systems  Constitutional: Negative for fever.  Respiratory: Negative for shortness of breath.   Cardiovascular: Negative for chest pain.  Gastrointestinal: Negative for abdominal pain.    Genitourinary: Negative for flank pain.  Musculoskeletal: Negative for back pain and neck pain.  Skin: Negative for wound.  Allergic/Immunologic: Negative for immunocompromised state.  Neurological: Positive for headaches. Negative for light-headedness.  Hematological: Does not bruise/bleed easily.  Psychiatric/Behavioral: Negative for confusion.     Physical Exam Updated Vital Signs BP (!) 143/57 (BP Location: Left Arm)   Pulse 83   Temp 97.6 F (36.4 C) (Oral)   Resp 15   Ht  (1.753 m)   Wt 225 lb (102.1 kg)   SpO2 95%   BMI 33.23 kg/m   Physical Exam Physical Exam  Nursing note and vitals reviewed. Constitutional: Well developed, well nourished, non-toxic, and in no acute distress Head: Normocephalic and atraumatic.  Mouth/Throat: Oropharynx is clear and moist.  Neck: Normal range of motion. Neck supple.  Cardiovascular: Normal rate and regular rhythm.  +2 radial pulse. Pulmonary/Chest: Effort normal and breath sounds normal.  Abdominal: Soft. There is no tenderness. There is no rebound and no guarding.  Musculoskeletal: Soft tissue swelling of the right elbow, with limited range of motion due to pain.  Neurological: Alert, no facial droop, fluent speech, in tact innervation of radial, ulnar, and median nerves of the right hand Skin: Skin is warm and dry.  Psychiatric: Cooperative   ED Treatments / Results  Labs (all labs ordered are listed, but only abnormal results are displayed) Labs Reviewed - No data to display  EKG  EKG Interpretation None       Radiology Dg Elbow Complete Right  Result Date: 10/05/2016 CLINICAL DATA:  Fall 4 days ago.  Right elbow pain. EXAM: RIGHT ELBOW - COMPLETE 3+ VIEW COMPARISON:  None. FINDINGS: On 1 image, there is a subtle disruption of the cortical margin of the articular surface of the radial head consistent with a nondisplaced fracture. No other evidence of a fracture. Lateral view shows elevation the anterior fat pad  consistent with a joint effusion. Elbow joint is normally spaced and aligned with no arthropathic change. IMPRESSION: 1. Subtle fracture of the radial head, evident on 1 image only, but supported by an elbow joint effusion. 2. No other fractures.  No dislocation. Electronically Signed   By: Amie Portland M.D.   On: 10/05/2016 09:17   Dg Forearm Right  Result Date: 10/05/2016 CLINICAL DATA:  Patient fell 4 days ago.  Right forearm pain. EXAM: RIGHT FOREARM - 2 VIEW COMPARISON:  None. FINDINGS: There is no evidence of fracture or other focal bone lesions. Soft tissues are unremarkable. IMPRESSION: Negative. Electronically Signed   By: Amie Portland M.D.   On: 10/05/2016 09:17    Procedures Procedures (including critical  care time) SPLINT APPLICATION Date/Time: 10:08 AM Authorized by: Lavera Guise Consent: Verbal consent obtained. Risks and benefits: risks, benefits and alternatives were discussed Consent given by: patient Splint applied by: orthopedic technician Location details: right arm Splint type: long arm posterior mold Supplies used: orthoglass Post-procedure: The splinted body part was neurovascularly unchanged following the procedure. Patient tolerance: Patient tolerated the procedure well with no immediate complications.    Medications Ordered in ED Medications  oxyCODONE (Oxy IR/ROXICODONE) immediate release tablet 5 mg (5 mg Oral Given 10/05/16 0941)     Initial Impression / Assessment and Plan / ED Course  I have reviewed the triage vital signs and the nursing notes.  Pertinent labs & imaging results that were available during my care of the patient were reviewed by me and considered in my medical decision making (see chart for details).     Presents after mechanical fall a few days ago with persistent right elbow swelling and pain. With limited range of motion of the elbow due to pain and soft tissue swelling. Distally extremity is neurovascularly intact. X-rays  visualized there is small radial head fracture.  No other injuries on exam. We'll place in a splint. Patient has been seen by Dr. Magnus Ivan in the past, and prefers to follow-up with him. Strict return and follow-up instructions reviewed. He expressed understanding of all discharge instructions and felt comfortable with the plan of care.   Final Clinical Impressions(s) / ED Diagnoses   Final diagnoses:  Closed nondisplaced fracture of head of right radius, initial encounter    New Prescriptions New Prescriptions   OXYCODONE (ROXICODONE) 5 MG IMMEDIATE RELEASE TABLET    Take 1 tablet (5 mg total) by mouth every 4 (four) hours as needed for moderate pain or severe pain.     Lavera Guise, MD 10/05/16 [phone]

## 2016-10-05 NOTE — ED Triage Notes (Signed)
Patient c/o right elbow and right forearm pain. Patient states fell walking down stairs Thursday and landed against right elbow/arm. Denies hitting head, LOC, or any other pain. Patient states pain is progressively getting worse with limited ROM. Denies taking any medications for pain.

## 2016-10-05 NOTE — ED Notes (Signed)
Dr. Verdie Mosher is now in room with patient. She is trying to find a pain medication that will be sufficient for him. He is refusing anything with acetaminophen in it. He refused Tramadol, Vidodin, and Percocet.

## 2016-10-06 ENCOUNTER — Ambulatory Visit (INDEPENDENT_AMBULATORY_CARE_PROVIDER_SITE_OTHER): Payer: Medicaid Other | Admitting: Orthopaedic Surgery

## 2016-10-06 DIAGNOSIS — S52124A Nondisplaced fracture of head of right radius, initial encounter for closed fracture: Secondary | ICD-10-CM

## 2016-10-06 MED ORDER — OXYCODONE HCL 5 MG PO TABS: 5.0000 mg | ORAL_TABLET | ORAL | 0 refills | Status: DC | PRN

## 2016-10-06 NOTE — Progress Notes (Signed)
Office Visit Note   Patient: Ian [name]           Date of Birth: August 14, 1960           MRN: [id number] Visit Date: 10/06/2016              Requested by: Joette Catching, MD [address] [place], [place] [address] PCP: Josue Hector, MD   Assessment & Plan: Visit Diagnoses:  1. Closed nondisplaced fracture of head of right radius, initial encounter     Plan: I'll try to give her reassurance that he will do well. We only splint radial head fractures for 7-10 days because they do give the elbow moving. I did refill his oxycodone which she says is not working I told him he needs to make it work. Offered him anti-inflammatories as well but he says that his allergic to these and bothers his stomach. We'll see him back in a week.  Follow-Up Instructions: Return in about 1 week (around 10/13/2016).   Orders:  No orders of the defined types were placed in this encounter.  Meds ordered this encounter  Medications  . oxyCODONE (ROXICODONE) 5 MG immediate release tablet    Sig: Take 1-2 tablets (5-10 mg total) by mouth every 4 (four) hours as needed for moderate pain or severe pain.    Dispense:  60 tablet    Refill:  0      Procedures: No procedures performed   Clinical Data: No additional findings.   Subjective: No chief complaint on file. The patient is a 56 year old referred from the emergency room to evaluate acute right elbow injury. Apparently he has a radial head fracture. This is nondisplaced. I have reviewed the x-rays. He is right-hand-dominant. He fell on Thursday the 19th and then went to the ER on Sunday for further rotation treatment. He does report elbow pain. He denies a nubs and tingling in his hand.  HPI  Review of Systems He denies any pain, shortness of breath, fever, chills, nausea, vomiting, headache  Objective: Vital Signs: There were no vitals taken for this visit.  Physical Exam He is alert and oriented 3 and in no acute  distress Ortho Exam He is currently in a well fitting splint. His hand is well-perfused. He has intact radial nerve function. Specialty Comments:  No specialty comments available.  Imaging: No results found. X-rays on the canopy system independently reviewed by me show a nondisplaced right radial head fracture.  PMFS History: Patient Active Problem List   Diagnosis Date Noted  . 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 HLD (hyperlipidemia) 03/06/2015  . Tobacco use disorder 03/06/2015  . Palpitations 03/05/2015  . 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 Tobacco abuse 11/07/2014  . Chest pain 08/20/2013  . No significant medical problems    Past Medical History:  Diagnosis Date  . Hx of cardiovascular stress test    Lexiscan Myoview (09/2013):  No ischemia, EF 62%; NORMAL  . Leg pain    ABIs (09/29/13):  Normal.  . Stomach ulcer   . Stroke (HCC)   . Tobacco abuse     Family History  Problem Relation Age of Onset  . CAD Father 32    Died of MI  . Heart failure Mother 15    Alive  . Diabetes Mother   . Diabetes Sister   . CAD Paternal Grandfather 88    Died of MI  . Heart disease Daughter     Rapid heart rate  Past Surgical History:  Procedure Laterality Date  . ANKLE FRACTURE SURGERY    . APPENDECTOMY     Social History   Occupational History  .  Not Employed   Social History Main Topics  . Smoking status: Current Every Day Smoker    Packs/day: 1.00    Years: 35.00    Types: Cigarettes  . Smokeless tobacco: Never Used     Comment: smokes 4 cigarettes a day  . Alcohol use No  . Drug use: No  . Sexual activity: Not on file

## 2016-10-17 ENCOUNTER — Ambulatory Visit (INDEPENDENT_AMBULATORY_CARE_PROVIDER_SITE_OTHER): Payer: Medicaid Other | Admitting: Physician Assistant

## 2016-10-17 ENCOUNTER — Ambulatory Visit (INDEPENDENT_AMBULATORY_CARE_PROVIDER_SITE_OTHER): Payer: Medicaid Other

## 2016-10-17 DIAGNOSIS — M25521 Pain in right elbow: Secondary | ICD-10-CM

## 2016-10-17 DIAGNOSIS — S52124D Nondisplaced fracture of head of right radius, subsequent encounter for closed fracture with routine healing: Secondary | ICD-10-CM

## 2016-10-17 DIAGNOSIS — S52126D Nondisplaced fracture of head of unspecified radius, subsequent encounter for closed fracture with routine healing: Secondary | ICD-10-CM | POA: Insufficient documentation

## 2016-10-17 NOTE — Progress Notes (Signed)
Office Visit Note   Patient: Ian [name]           Date of Birth: 12/06/1960           MRN: [id number] Visit Date: 10/17/2016              Requested by: Joette CatchingLeonard Nyland, MD [address] [place], [place] [id number] PCP: Josue HectorNYLAND,LEONARD ROBERT, MD   Assessment & Plan: Visit Diagnoses:  1. Pain in right elbow   2. Closed nondisplaced fracture of head of right radius with routine healing, subsequent encounter     Plan: He will use the sling for comfort. Splint was removed from the right arm. Encouraged him to work on range of motion of the elbow and arm. No heavy lifting or twisting of the right arm. Follow with us in a month check his progress lack of. No radiographs at that  Follow-Up Instructions: Return in about 4 weeks (around 11/14/2016).   Orders:  Orders Placed This Encounter  Procedures  . XR Elbow 2 Views Right   No orders of the defined types were placed in this encounter.     Procedures: No procedures performed   Clinical Data: No additional findings.   Subjective: Right elbow pain once  HPI [name] returns today for follow-up of his right radial head rash or which is nondisplaced. He's been in posterior splint and a sling. Is overall doing well has no major complaints. Review of Systems   Objective: Vital Signs: There were no vitals taken for this visit.  Physical Exam Well-developed well-nourished male in no acute distress. Ortho Exam Right radial pulse 2+. He has near full extension of the elbow and flexion. He is able to supinate and pronate the forearm with minimal pain. Tenderness over the right radial head. Small area of abrasion over the distal medial humerus region from the splint otherwise no rashes skin lesions ulcerations right upper extremity. Specialty Comments:  No specialty comments available.  Imaging: Xr Elbow 2 Views Right  Result Date: 10/17/2016 AP lateral views right elbow: Radial head fracture remains nondisplaced. No  other fractures identified. Elbow well located.    PMFS History: Patient Active Problem List   Diagnosis Date Noted  . Closed nondisplaced fracture of head of radius with routine healing 10/17/2016  . Pain in right elbow 10/17/2016  . 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 HLD (hyperlipidemia) 03/06/2015  . Tobacco use disorder 03/06/2015  . Palpitations 03/05/2015  . 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 Tobacco abuse 11/07/2014  . Chest pain 08/20/2013  . No significant medical problems    Past Medical History:  Diagnosis Date  . Hx of cardiovascular stress test    Lexiscan Myoview (09/2013):  No ischemia, EF 62%; NORMAL  . Leg pain    ABIs (09/29/13):  Normal.  . Stomach ulcer   . Stroke (HCC)   . Tobacco abuse     Family History  Problem Relation Age of Onset  . CAD Father 10670    Died of MI  . Heart failure Mother 5665    Alive  . Diabetes Mother   . Diabetes Sister   . CAD Paternal Grandfather 8460    Died of MI  . Heart disease Daughter     Rapid heart rate    Past Surgical History:  Procedure Laterality Date  . ANKLE FRACTURE SURGERY    . APPENDECTOMY     Social History   Occupational History  .  Not Employed  Social History Main Topics  . Smoking status: Current Every Day Smoker    Packs/day: 1.00    Years: 35.00    Types: Cigarettes  . Smokeless tobacco: Never Used     Comment: smokes 4 cigarettes a day  . Alcohol use No  . Drug use: No  . Sexual activity: Not on file      `

## 2016-11-17 ENCOUNTER — Ambulatory Visit (INDEPENDENT_AMBULATORY_CARE_PROVIDER_SITE_OTHER): Payer: Medicaid Other | Admitting: Physician Assistant

## 2016-11-17 ENCOUNTER — Encounter (INDEPENDENT_AMBULATORY_CARE_PROVIDER_SITE_OTHER): Payer: Self-pay | Admitting: Physician Assistant

## 2016-11-17 DIAGNOSIS — S52125D Nondisplaced fracture of head of left radius, subsequent encounter for closed fracture with routine healing: Secondary | ICD-10-CM

## 2016-11-17 NOTE — Progress Notes (Signed)
Ian [name] returns today now approximately 7 weeks status post right elbow fracture. He states overall it is doing well. He is able to extend and flex the elbow without much pain he states he is back to doing activities such as combing his hair. He continues to wear the sling when out in public.  Right upper extremity: Full range of motion of the right elbow pain. Full supination pronation the right forearm. Minimal tenderness over the radial head with deep palpation. No rashes skin lesions ulcerations or erythema right elbow.  Assessment: Approximately 7 weeks status post conservative treatment of a right radial head fracture nondisplaced.  Plan: No heavy lifting with the arm for the next 2 weeks and then resume activities as tolerated. He is to discontinue sling. He'll follow up with us if he has any questions or concerns.

## 2017-02-17 ENCOUNTER — Ambulatory Visit (INDEPENDENT_AMBULATORY_CARE_PROVIDER_SITE_OTHER): Payer: Medicaid Other | Admitting: Orthopaedic Surgery

## 2017-02-17 DIAGNOSIS — S52125D Nondisplaced fracture of head of left radius, subsequent encounter for closed fracture with routine healing: Secondary | ICD-10-CM

## 2017-02-17 DIAGNOSIS — M25512 Pain in left shoulder: Secondary | ICD-10-CM | POA: Diagnosis not present

## 2017-02-17 DIAGNOSIS — G8929 Other chronic pain: Secondary | ICD-10-CM

## 2017-02-17 MED ORDER — METHYLPREDNISOLONE ACETATE 40 MG/ML IJ SUSP
40.0000 mg | INTRAMUSCULAR | Status: AC | PRN
  Administered 2017-02-17: 40 mg via INTRA_ARTICULAR

## 2017-02-17 MED ORDER — LIDOCAINE HCL 1 % IJ SOLN
3.0000 mL | INTRAMUSCULAR | Status: AC | PRN
  Administered 2017-02-17: 3 mL

## 2017-02-17 NOTE — Progress Notes (Signed)
Office Visit Note   Patient: Ian [name]           Date of Birth: [birth year]           MRN: [id number] Visit Date: 02/17/2017              Requested by: Joette Catching, MD [address] [place], [place] [address] PCP: Joette Catching, MD   Assessment & Plan: Visit Diagnoses:  1. Closed nondisplaced fracture of head of left radius with routine healing, subsequent encounter   2. Chronic left shoulder pain     Plan: Due to the pain is experiencing in his left shoulder we have recommended a steroid injection since he has done well from these in the past. We explained the risk and benefits of these types of injections and parietal without difficulty. All questions were encouraged and answered. He'll follow-up as needed.  Follow-Up Instructions: Return if symptoms worsen or fail to improve.   Orders:  Orders Placed This Encounter  Procedures  . Large Joint Injection/Arthrocentesis   No orders of the defined types were placed in this encounter.     Procedures: Large Joint Inj Date/Time: 02/17/2017 9:08 AM Performed by: Kathryne Hitch Authorized by: Kathryne Hitch   Location:  Shoulder Site:  L subacromial bursa Ultrasound Guidance: No   Fluoroscopic Guidance: No   Arthrogram: No   Medications:  3 mL lidocaine 1 %; 40 mg methylPREDNISolone acetate 40 MG/ML     Clinical Data: No additional findings.   Subjective: No chief complaint on file. The patient comes in today with chief complaint of left shoulder pain. He feels like he said decreased strength and decreased range of motion that shoulder. He said it hurts with overhead activities and he like to consider a steroid shot in his shoulder causes responded steroid shots in the past positively with one being done in his knee before. He used to be around 250 pounds in weight knees lost at least 25 pounds. He does report some popping in the shoulder and an achy type of pain. It is sometimes 7 out of  10.  HPI  Review of Systems He currently denies any headache, chest pain, shortness of breath, fever, chills, nausea, vomiting  Objective: Vital Signs: There were no vitals taken for this visit.  Physical Exam He is alert or 3 and in no acute distress Ortho Exam Examination of the left shoulder shows fluid range of motion with some pain with a positive Neer and Hawkins sign. The shoulders well located in his rotator cuff feels strong. Specialty Comments:  No specialty comments available.  Imaging: No results found. We did not x-ray the shoulder today.  PMFS History: Patient Active Problem List   Diagnosis Date Noted  . Chronic left shoulder pain 02/17/2017  . Closed nondisplaced fracture of head of radius with routine healing 10/17/2016  . Pain in right elbow 10/17/2016  . 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 HLD (hyperlipidemia) 03/06/2015  . Tobacco use disorder 03/06/2015  . Palpitations 03/05/2015  . 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 Tobacco abuse 11/07/2014  . Chest pain 08/20/2013  . No significant medical problems    Past Medical History:  Diagnosis Date  . Hx of cardiovascular stress test    Lexiscan Myoview (09/2013):  No ischemia, EF 62%; NORMAL  . Leg pain    ABIs (09/29/13):  Normal.  . Stomach ulcer   . Stroke (HCC)   . Tobacco abuse  Family History  Problem Relation Age of Onset  . CAD Father 3570       Died of MI  . Heart failure Mother 1765       Alive  . Diabetes Mother   . Diabetes Sister   . CAD Paternal Grandfather 2860       Died of MI  . Heart disease Daughter        Rapid heart rate    Past Surgical History:  Procedure Laterality Date  . ANKLE FRACTURE SURGERY    . APPENDECTOMY     Social History   Occupational History  .  Not Employed   Social History Main Topics  . Smoking status: Current Every Day Smoker    Packs/day: 1.00    Years: 35.00    Types: Cigarettes  . Smokeless tobacco: Never Used      Comment: smokes 4 cigarettes a day  . Alcohol use No  . Drug use: No  . Sexual activity: Not on file

## 2018-02-03 ENCOUNTER — Encounter: Payer: Self-pay | Admitting: Pulmonary Disease

## 2018-02-03 ENCOUNTER — Ambulatory Visit (INDEPENDENT_AMBULATORY_CARE_PROVIDER_SITE_OTHER): Payer: Medicaid Other | Admitting: Pulmonary Disease

## 2018-02-03 VITALS — BP 116/70 | HR 73 | Ht 68.5 in | Wt 215.4 lb

## 2018-02-03 DIAGNOSIS — Z720 Tobacco use: Secondary | ICD-10-CM

## 2018-02-03 DIAGNOSIS — R0602 Shortness of breath: Secondary | ICD-10-CM | POA: Diagnosis not present

## 2018-02-03 DIAGNOSIS — R0609 Other forms of dyspnea: Secondary | ICD-10-CM

## 2018-02-03 DIAGNOSIS — Z8249 Family history of ischemic heart disease and other diseases of the circulatory system: Secondary | ICD-10-CM | POA: Diagnosis not present

## 2018-02-03 DIAGNOSIS — R079 Chest pain, unspecified: Secondary | ICD-10-CM | POA: Diagnosis not present

## 2018-02-03 DIAGNOSIS — Z87898 Personal history of other specified conditions: Secondary | ICD-10-CM

## 2018-02-03 DIAGNOSIS — F17200 Nicotine dependence, unspecified, uncomplicated: Secondary | ICD-10-CM

## 2018-02-03 MED ORDER — TIOTROPIUM BROMIDE MONOHYDRATE 2.5 MCG/ACT IN AERS: 1.0000 | INHALATION_SPRAY | Freq: Two times a day (BID) | RESPIRATORY_TRACT | 0 refills | Status: DC

## 2018-02-03 MED ORDER — ALBUTEROL SULFATE HFA 108 (90 BASE) MCG/ACT IN AERS: 2.0000 | INHALATION_SPRAY | Freq: Four times a day (QID) | RESPIRATORY_TRACT | 6 refills | Status: DC | PRN

## 2018-02-03 MED ORDER — AEROCHAMBER MINI CHAMBER DEVI: 1.0000 | 0 refills | Status: DC

## 2018-02-03 NOTE — Progress Notes (Addendum)
Synopsis: Referred in August 2019 for wheezing/SOB by Joette Catching, MD  Subjective:   PATIENT ID: Ian [name] GENDER: male DOB: [birth year], MRN: [id number]  Chief Complaint  Patient presents with  . pulmonary consult    referred by Reynaldo Minium, PA. patient has sob and wheezing.   PMH of tobacco abuse and CVA. He was recently started on an inhaler, twice in the morning and twice at night. He is still having trouble breathing.   He has been smoking since 57 years old, and by teenager he was smoking regularly. He is currently down to a pack per day. At his max he was smoking 2-3 packs per day. He has never had PFTs before or seen a pulmonologist.   His biggest trouble with his breathing is with exertion. He occasionally has chest tightness with exertion. He admits to chest pain behind his sternum with climbing up a hill.  His chest pain is associated with shortness of breath.  His shortness of breath is also associated with cough in the morning and cough at night.  He does have mild amount of sputum production that is usually whitish in color.  He wheezes on occasion.  After being started on his inhaler Symbicort 2 puffs twice daily he does feel better.  FMHX father with MI, grandfather with MI and brother with CAD s/p CABGX5.   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     Past Medical History:  Diagnosis Date  . Hx of cardiovascular stress test    Lexiscan Myoview (09/2013):  No ischemia, EF 62%; NORMAL  . Leg pain    ABIs (09/29/13):  Normal.  . Stomach ulcer   . Stroke (HCC)   . Tobacco abuse      Family History  Problem Relation Age of Onset  . CAD Father 1       Died of MI  . Heart failure Mother 75       Alive  . Diabetes Mother   . Diabetes Sister   . CAD Paternal Grandfather 46       Died of MI  . Heart disease Daughter        Rapid heart rate  . CAD Brother   .  Cancer Maternal Grandmother        Throat cancer      Social History   Socioeconomic History  . Marital status: Divorced    Spouse name: Not on file  . Number of children: 5  . Years of education: Not on file  . Highest education level: Not on file  Occupational History    Employer: NOT EMPLOYED  Social Needs  . Financial resource strain: Not on file  . Food insecurity:    Worry: Not on file    Inability: Not on file  . Transportation needs:    Medical: Not on file    Non-medical: Not on file  Tobacco Use  . Smoking status: Current Every Day Smoker    Packs/day: 1.00    Years: 35.00    Pack years: 35.00    Types: Cigarettes  . Smokeless tobacco: Never Used  . Tobacco comment: smokes 1 ppd 8.21.19  Substance and Sexual Activity  . Alcohol use: Not Currently    Alcohol/week: 9.0 - 13.0 standard drinks    Types: 1 Glasses of wine, 8 - 12 Cans of beer per week    Comment:  Drank beer, for 35 years, quit 5 years ago   . Drug use: No  . Sexual activity: Not on file  Lifestyle  . Physical activity:    Days per week: Not on file    Minutes per session: Not on file  . Stress: Not on file  Relationships  . Social connections:    Talks on phone: Not on file    Gets together: Not on file    Attends religious service: Not on file    Active member of club or organization: Not on file    Attends meetings of clubs or organizations: Not on file    Relationship status: Not on file  . Intimate partner violence:    Fear of current or ex partner: Not on file    Emotionally abused: Not on file    Physically abused: Not on file    Forced sexual activity: Not on file  Other Topics Concern  . Not on file  Social History Narrative   Lives with      Allergies  Allergen Reactions  . Asa [Aspirin] Other (See Comments)  . Ibuprofen Other (See Comments)  . Tylenol [Acetaminophen] Other (See Comments)     Outpatient Medications Prior to Visit  Medication Sig Dispense Refill  .  ALPRAZolam (XANAX) 1 MG tablet Take 1 mg by mouth 3 (three) times daily as needed for anxiety.     Marland Kitchen. atorvastatin (LIPITOR) 20 MG tablet Take 1 tablet (20 mg total) by mouth daily. 30 tablet 6  . clopidogrel (PLAVIX) 75 MG tablet Take 1 tablet (75 mg total) by mouth daily. 30 tablet 0  . gabapentin (NEURONTIN) 300 MG capsule Take 300 mg by mouth 3 (three) times daily.  2  . meloxicam (MOBIC) 15 MG tablet Take 15 mg by mouth daily.    Marland Kitchen. oxyCODONE (ROXICODONE) 5 MG immediate release tablet Take 1-2 tablets (5-10 mg total) by mouth every 4 (four) hours as needed for moderate pain or severe pain. 60 tablet 0  . pantoprazole (PROTONIX) 40 MG tablet Take 40 mg by mouth daily.    . TRICOR 145 MG tablet Take 1 tablet (145 mg total) by mouth daily. 30 tablet 6  . Vitamin D, Ergocalciferol, (DRISDOL) 50000 UNITS CAPS capsule TAKE ONE CAPSULE BY MOUTH WEEKLY     No facility-administered medications prior to visit.     Review of Systems  Constitutional: Positive for weight loss (40lbs ).  HENT: Negative.   Eyes: Negative.   Respiratory: Positive for cough, shortness of breath and wheezing.   Cardiovascular: Positive for chest pain (with extertion ).  Gastrointestinal: Negative.   Genitourinary: Negative.   Musculoskeletal: Negative.   Skin: Negative.   Neurological: Negative.   Endo/Heme/Allergies: Negative.   Psychiatric/Behavioral: Negative.      Objective:  Physical Exam  Constitutional: He is oriented to person, place, and time. He appears well-developed and well-nourished. No distress.  HENT:  Head: Normocephalic and atraumatic.  Mouth/Throat: Oropharynx is clear and moist.  Eyes: Pupils are equal, round, and reactive to light. Conjunctivae are normal. No scleral icterus.  Neck: Neck supple. No JVD present. No tracheal deviation present.  Cardiovascular: Normal rate, regular rhythm, normal heart sounds and intact distal pulses.  No murmur heard. Pulmonary/Chest: Effort normal. No  accessory muscle usage or stridor. No tachypnea. No respiratory distress. He has wheezes (RLL). He has no rhonchi. He has no rales.  Diminished bilaterally, Barrel chest   Abdominal: Soft. Bowel sounds are normal. He exhibits  no distension. There is no tenderness.  Musculoskeletal: He exhibits no edema or tenderness.  Lymphadenopathy:    He has no cervical adenopathy.  Neurological: He is alert and oriented to person, place, and time.  Skin: Skin is warm and dry. Capillary refill takes less than 2 seconds. No rash noted.  Psychiatric: He has a normal mood and affect. His behavior is normal.  Vitals reviewed.    Vitals:   02/03/18 1044  BP: 116/70  Pulse: 73  SpO2: 93%  Weight: 215 lb 6.4 oz (97.7 kg)  Height: 5' 8.5" (1.74 m)   93% on RA BMI Readings from Last 3 Encounters:  02/03/18 32.28 kg/m  10/05/16 33.23 kg/m  01/10/16 32.75 kg/m   Wt Readings from Last 3 Encounters:  02/03/18 215 lb 6.4 oz (97.7 kg)  10/05/16 225 lb (102.1 kg)  01/10/16 221 lb 12.8 oz (100.6 kg)    CBC    Component Value Date/Time   WBC 7.9 11/07/2014 1358   RBC 5.44 11/07/2014 1358   HGB 16.3 11/07/2014 1358   HCT 49.0 11/07/2014 1358   PLT 202 11/07/2014 1358   MCV 90.1 11/07/2014 1358   MCH 30.0 11/07/2014 1358   MCHC 33.3 11/07/2014 1358   RDW 12.8 11/07/2014 1358   LYMPHSABS 1.7 11/07/2014 1358   MONOABS 0.6 11/07/2014 1358   EOSABS 0.2 11/07/2014 1358   BASOSABS 0.0 11/07/2014 1358    Chest Imaging:  11/07/2014 IMPRESSION: No active cardiopulmonary disease.  Pulmonary Functions Testing Results: No results found for: FEV1, FVC, FEV1FVC, TLC, DLCO  FeNO: None   Pathology: None   Echocardiogram:   Study Conclusions - Left ventricle: Technically difficult study. The cavity size was   normal. Wall thickness was increased in a pattern of mild LVH.   The estimated ejection fraction was 60%. Wall motion was normal;   there were no regional wall motion abnormalities. - Right  ventricle: The cavity size was normal. Systolic function   was normal. - Impressions: No cardiac source of embolism was identified, but   cannot be ruled out on the basis of this examination. Impressions: - No cardiac source of embolism was identified, but cannot be ruled   out on the basis of this examination.  Heart Catheterization: None     Assessment & Plan:   DOE (dyspnea on exertion)  Shortness of breath - Plan: CT Chest Wo Contrast, Pulmonary Function Test  Chest pain on exertion  Family history of early CAD  Tobacco abuse  Current smoker  History of alcohol use  Discussion:  This is a 57 year old gentleman that presents to the office today with complaints of shortness of breath and dyspnea on exertion.  He does have occasional wheezing and cough and he has a long-standing smoking history is concerning for underlying COPD.  He does have improvement with the initiation of ICS/LABA by his primary care provider.  He has not had PFTs.  At this time we will order full pulmonary function tests for evaluation of underlying obstruction.  Upon review of systems the patient states that he has lost 40 pounds.  Per the patient he states that he weighed 260.  But looking at documentation in our record he has stayed around 210-225.  With his significant smoking history this is concerning and in conjunction with his progressive shortness of breath we will obtain a noncontrast CT chest.  Plan: Pulmonary function test, pending  Noncontrast CT of the chest to rule out possibility of underlying malignancy, due to  current respiratory symptoms and weight loss would not qualify for low-dose lung cancer screening CT. Given samples of Spiriva Respimat Given spacer to be used with Symbicort, can continue Symbicort as prescribed Albuterol inhaler as needed He has been seen by cardiology in the past and will recommend follow-up with his outpatient cardiologist for continued evaluation of his  chest pain with exertion. Walk today in the office to ensure no oxygen need. Patient was counseled on smoking cessation. Return to clinic in 4 to 6 weeks with NP.   Current Outpatient Medications:  .  ALPRAZolam (XANAX) 1 MG tablet, Take 1 mg by mouth 3 (three) times daily as needed for anxiety. , Disp: , Rfl:  .  atorvastatin (LIPITOR) 20 MG tablet, Take 1 tablet (20 mg total) by mouth daily., Disp: 30 tablet, Rfl: 6 .  clopidogrel (PLAVIX) 75 MG tablet, Take 1 tablet (75 mg total) by mouth daily., Disp: 30 tablet, Rfl: 0 .  gabapentin (NEURONTIN) 300 MG capsule, Take 300 mg by mouth 3 (three) times daily., Disp: , Rfl: 2 .  meloxicam (MOBIC) 15 MG tablet, Take 15 mg by mouth daily., Disp: , Rfl:  .  oxyCODONE (ROXICODONE) 5 MG immediate release tablet, Take 1-2 tablets (5-10 mg total) by mouth every 4 (four) hours as needed for moderate pain or severe pain., Disp: 60 tablet, Rfl: 0 .  pantoprazole (PROTONIX) 40 MG tablet, Take 40 mg by mouth daily., Disp: , Rfl:  .  TRICOR 145 MG tablet, Take 1 tablet (145 mg total) by mouth daily., Disp: 30 tablet, Rfl: 6 .  Vitamin D, Ergocalciferol, (DRISDOL) 50000 UNITS CAPS capsule, TAKE ONE CAPSULE BY MOUTH WEEKLY, Disp: , Rfl:  .  Spacer/Aero-Holding Chambers (AEROCHAMBER MINI CHAMBER) DEVI, 1 Device by Does not apply route as directed., Disp: 1 Device, Rfl: 0 .  Tiotropium Bromide Monohydrate (SPIRIVA RESPIMAT) 2.5 MCG/ACT AERS, Inhale 1 puff into the lungs 2 (two) times daily., Disp: 2 Inhaler, Rfl: 0   [name], DO [facility] 02/03/2018 11:17 AM

## 2018-02-03 NOTE — Patient Instructions (Addendum)
Due to the shortness of breath and 40 pound weight loss we will obtain a CT image of the chest for evaluation of your lungs.  We will obtain lung function tests prior to the next time [facility].  We will give you a sample of Spiriva Respimat to be used 2 puffs once daily in conjunction with your Symbicort.  Due to your family history of coronary artery disease as well as symptoms of chest pain with exertion will recommend follow-up with your cardiologist. Follow-up in our clinic in 4 to 6 weeks

## 2018-02-04 ENCOUNTER — Other Ambulatory Visit: Payer: Self-pay | Admitting: Pulmonary Disease

## 2018-02-04 DIAGNOSIS — R0602 Shortness of breath: Secondary | ICD-10-CM

## 2018-02-04 NOTE — Progress Notes (Signed)
cxr 

## 2018-02-08 ENCOUNTER — Ambulatory Visit (INDEPENDENT_AMBULATORY_CARE_PROVIDER_SITE_OTHER)
Admission: RE | Admit: 2018-02-08 | Discharge: 2018-02-08 | Disposition: A | Discharge status: Home / Self Care | Payer: Medicaid Other | Source: Ambulatory Visit | Attending: Pulmonary Disease | Admitting: Pulmonary Disease

## 2018-02-08 DIAGNOSIS — R0602 Shortness of breath: Secondary | ICD-10-CM | POA: Diagnosis not present

## 2018-02-18 ENCOUNTER — Ambulatory Visit (HOSPITAL_COMMUNITY)
Admission: RE | Admit: 2018-02-18 | Discharge: 2018-02-18 | Disposition: A | Discharge status: Home / Self Care | Payer: Medicaid Other | Source: Ambulatory Visit | Attending: Pulmonary Disease | Admitting: Pulmonary Disease

## 2018-02-18 DIAGNOSIS — R0602 Shortness of breath: Secondary | ICD-10-CM | POA: Diagnosis not present

## 2018-02-18 DIAGNOSIS — J439 Emphysema, unspecified: Secondary | ICD-10-CM | POA: Diagnosis not present

## 2018-02-18 DIAGNOSIS — I700 Atherosclerosis of aorta: Secondary | ICD-10-CM | POA: Insufficient documentation

## 2018-03-02 ENCOUNTER — Telehealth: Payer: Self-pay | Admitting: Pulmonary Disease

## 2018-03-02 NOTE — Telephone Encounter (Signed)
Advised pt of results. Pt understood and nothing further is needed.    Notes recorded by Josephine IgoIcard, Bradley L, DO on 02/24/2018 at 1:18 PM EDT [place], Please let Mr. Jarquin know his CT looks ok. There is evidence of emphysema which is expected.  Thanks BLI

## 2018-03-10 ENCOUNTER — Ambulatory Visit: Payer: Medicaid Other | Admitting: Pulmonary Disease

## 2018-03-12 ENCOUNTER — Ambulatory Visit (INDEPENDENT_AMBULATORY_CARE_PROVIDER_SITE_OTHER): Payer: Medicaid Other | Admitting: Pulmonary Disease

## 2018-03-12 ENCOUNTER — Encounter: Payer: Self-pay | Admitting: Pulmonary Disease

## 2018-03-12 VITALS — BP 120/70 | HR 73 | Ht 67.0 in | Wt 217.0 lb

## 2018-03-12 DIAGNOSIS — J432 Centrilobular emphysema: Secondary | ICD-10-CM

## 2018-03-12 DIAGNOSIS — J449 Chronic obstructive pulmonary disease, unspecified: Secondary | ICD-10-CM

## 2018-03-12 DIAGNOSIS — Z720 Tobacco use: Secondary | ICD-10-CM | POA: Diagnosis not present

## 2018-03-12 DIAGNOSIS — R0602 Shortness of breath: Secondary | ICD-10-CM | POA: Diagnosis not present

## 2018-03-12 DIAGNOSIS — R0609 Other forms of dyspnea: Secondary | ICD-10-CM | POA: Diagnosis not present

## 2018-03-12 DIAGNOSIS — J984 Other disorders of lung: Secondary | ICD-10-CM

## 2018-03-12 DIAGNOSIS — J439 Emphysema, unspecified: Secondary | ICD-10-CM

## 2018-03-12 LAB — PULMONARY FUNCTION TEST
DL/VA % PRED: 79 %
DL/VA: 3.53 ml/min/mmHg/L
DLCO UNC: 16.81 ml/min/mmHg
DLCO unc % pred: 59 %
FEF 25-75 POST: 2.99 L/s
FEF 25-75 Pre: 1.55 L/sec
FEF2575-%CHANGE-POST: 93 %
FEF2575-%PRED-PRE: 54 %
FEF2575-%Pred-Post: 104 %
FEV1-%Change-Post: 20 %
FEV1-%PRED-PRE: 59 %
FEV1-%Pred-Post: 71 %
FEV1-PRE: 1.99 L
FEV1-Post: 2.4 L
FEV1FVC-%Change-Post: 3 %
FEV1FVC-%Pred-Pre: 97 %
FEV6-%Change-Post: 16 %
FEV6-%PRED-PRE: 64 %
FEV6-%Pred-Post: 74 %
FEV6-PRE: 2.68 L
FEV6-Post: 3.12 L
FEV6FVC-%Pred-Post: 105 %
FEV6FVC-%Pred-Pre: 105 %
FVC-%Change-Post: 16 %
FVC-%PRED-PRE: 61 %
FVC-%Pred-Post: 71 %
FVC-POST: 3.12 L
FVC-PRE: 2.68 L
POST FEV1/FVC RATIO: 77 %
POST FEV6/FVC RATIO: 100 %
PRE FEV1/FVC RATIO: 74 %
Pre FEV6/FVC Ratio: 100 %

## 2018-03-12 MED ORDER — TIOTROPIUM BROMIDE MONOHYDRATE 2.5 MCG/ACT IN AERS: 1.0000 | INHALATION_SPRAY | Freq: Two times a day (BID) | RESPIRATORY_TRACT | 0 refills | Status: DC

## 2018-03-12 MED ORDER — TIOTROPIUM BROMIDE MONOHYDRATE 2.5 MCG/ACT IN AERS: 2.0000 | INHALATION_SPRAY | Freq: Every day | RESPIRATORY_TRACT | 3 refills | Status: DC

## 2018-03-12 MED ORDER — BUDESONIDE-FORMOTEROL FUMARATE 80-4.5 MCG/ACT IN AERO: 2.0000 | INHALATION_SPRAY | Freq: Two times a day (BID) | RESPIRATORY_TRACT | 5 refills | Status: DC

## 2018-03-12 NOTE — Progress Notes (Signed)
PFT done today. 

## 2018-03-12 NOTE — Patient Instructions (Addendum)
Thank you for visiting Dr. Tonia Brooms at [facility]. Today we recommend the following: Orders Placed This Encounter  Procedures  . CT CHEST LUNG CA SCREEN LOW DOSE W/O CM   You must quit smoking.  Return in about 3 months (around 06/11/2018), or if symptoms worsen or fail to improve.  To be scheduled with Maralyn Sago gross, NP.

## 2018-03-12 NOTE — Progress Notes (Signed)
Synopsis: Referred in August 2019 for wheezing/SOB by Joette Catching, MD  Subjective:   PATIENT ID: Ian [name]: 1960-07-16, MRN: [id number]  Chief Complaint  Patient presents with  . Follow-up    PFT today.    Initial office visit: PMH of tobacco abuse and CVA. He was recently started on an inhaler, twice in the morning and twice at night. He is still having trouble breathing.   He has been smoking since 57 years old, and by teenager he was smoking regularly. He is currently down to a pack per day. At his max he was smoking 2-3 packs per day, >50 pack year history. He has never had PFTs before or seen a pulmonologist. His biggest trouble with his breathing is with exertion. He occasionally has chest tightness with exertion. He admits to chest pain behind his sternum with climbing up a hill.  His chest pain is associated with shortness of breath.  His shortness of breath is also associated with cough in the morning and cough at night.  He does have mild amount of sputum production that is usually whitish in color.  He wheezes on occasion.  After being started on his inhaler Symbicort 2 puffs twice daily he does feel better.  FMHX father with MI, grandfather with MI and brother with CAD s/p CABGX5.   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  OV 03/12/2018: He is doing some better. He is still smoking. He had PFTs completed today, appears to have a mixed restrictive and obstructive defect. He feels like the inhalers are working. He still has dyspnea on exertion.  He does have breathlessness with climbing stairs or climbing he will.  He is cough may be some better.  He does wake up in the morning with sputum production which is yellowish in color.  He denies hemoptysis.  I reviewed recent CT imaging with the patient.  There is evidence of centrilobular emphysema.  No found etiology of  his weight loss.  The patient does admit that he difficulty with reading.  And has struggled with his medications at home and being able to read and interpret the instructions on the bottles inhalers.  He believes that he is doing them correctly however.     Past Medical History:  Diagnosis Date  . Hx of cardiovascular stress test    Lexiscan Myoview (09/2013):  No ischemia, EF 62%; NORMAL  . Leg pain    ABIs (09/29/13):  Normal.  . Stomach ulcer   . Stroke (HCC)   . Tobacco abuse      Family History  Problem Relation Age of Onset  . CAD Father 63       Died of MI  . Heart failure Mother 67       Alive  . Diabetes Mother   . Diabetes Sister   . CAD Paternal Grandfather 58       Died of MI  . Heart disease Daughter        Rapid heart rate  . CAD Brother   . Cancer Maternal Grandmother        Throat cancer      Social History   Socioeconomic History  . Marital status: Divorced    Spouse name: Not on file  . Number of children: 5  . Years of education: Not on file  . Highest education level: Not on file  Occupational History    Employer: NOT EMPLOYED  Social Needs  . Financial resource strain: Not on file  . Food insecurity:    Worry: Not on file    Inability: Not on file  . Transportation needs:    Medical: Not on file    Non-medical: Not on file  Tobacco Use  . Smoking status: Current Every Day Smoker    Packs/day: 1.00    Years: 35.00    Pack years: 35.00    Types: Cigarettes  . Smokeless tobacco: Never Used  . Tobacco comment: smokes 1 ppd 8.21.19  Substance and Sexual Activity  . Alcohol use: Not Currently    Alcohol/week: 9.0 - 13.0 standard drinks    Types: 1 Glasses of wine, 8 - 12 Cans of beer per week    Comment: Drank beer, for 35 years, quit 5 years ago   . Drug use: No  . Sexual activity: Not on file  Lifestyle  . Physical activity:    Days per week: Not on file    Minutes per session: Not on file  . Stress: Not on file  Relationships  .  Social connections:    Talks on phone: Not on file    Gets together: Not on file    Attends religious service: Not on file    Active member of club or organization: Not on file    Attends meetings of clubs or organizations: Not on file    Relationship status: Not on file  . Intimate partner violence:    Fear of current or ex partner: Not on file    Emotionally abused: Not on file    Physically abused: Not on file    Forced sexual activity: Not on file  Other Topics Concern  . Not on file  Social History Narrative   Lives with      Allergies  Allergen Reactions  . Asa [Aspirin] Other (See Comments)  . Ibuprofen Other (See Comments)  . Tylenol [Acetaminophen] Other (See Comments)     Outpatient Medications Prior to Visit  Medication Sig Dispense Refill  . albuterol (PROVENTIL HFA;VENTOLIN HFA) 108 (90 Base) MCG/ACT inhaler Inhale 2 puffs into the lungs every 6 (six) hours as needed for wheezing or shortness of breath. 1 Inhaler 6  . ALPRAZolam (XANAX) 1 MG tablet Take 1 mg by mouth 3 (three) times daily as needed for anxiety.     Marland Kitchen atorvastatin (LIPITOR) 20 MG tablet Take 1 tablet (20 mg total) by mouth daily. 30 tablet 6  . clopidogrel (PLAVIX) 75 MG tablet Take 1 tablet (75 mg total) by mouth daily. 30 tablet 0  . gabapentin (NEURONTIN) 300 MG capsule Take 300 mg by mouth 3 (three) times daily.  2  . meloxicam (MOBIC) 15 MG tablet Take 15 mg by mouth daily.    Marland Kitchen oxyCODONE (ROXICODONE) 5 MG immediate release tablet Take 1-2 tablets (5-10 mg total) by mouth every 4 (four) hours as needed for moderate pain or severe pain. 60 tablet 0  . pantoprazole (PROTONIX) 40 MG tablet Take 40 mg by mouth daily.    Marland Kitchen Spacer/Aero-Holding Chambers (AEROCHAMBER MINI CHAMBER) DEVI 1 Device by Does not apply route as directed. 1 Device 0  . Tiotropium Bromide Monohydrate (SPIRIVA RESPIMAT) 2.5 MCG/ACT AERS Inhale 1 puff into the lungs 2 (two) times daily. 2 Inhaler 0  . TRICOR 145 MG tablet Take 1  tablet (145 mg total) by mouth daily. 30 tablet 6  . Vitamin D, Ergocalciferol, (  DRISDOL) 50000 UNITS CAPS capsule TAKE ONE CAPSULE BY MOUTH WEEKLY     No facility-administered medications prior to visit.     Review of Systems  Constitutional: Positive for weight loss (40lbs ).  HENT: Negative.   Eyes: Negative.   Respiratory: Positive for cough, shortness of breath and wheezing.   Cardiovascular: Positive for chest pain (with extertion ).  Gastrointestinal: Negative.   Genitourinary: Negative.   Musculoskeletal: Negative.   Skin: Negative.   Neurological: Negative.   Endo/Heme/Allergies: Negative.   Psychiatric/Behavioral: Negative.      Objective:  Physical Exam  Constitutional: He is oriented to person, place, and time. He appears well-developed and well-nourished. No distress.  HENT:  Head: Normocephalic and atraumatic.  Mouth/Throat: Oropharynx is clear and moist.  Eyes: Pupils are equal, round, and reactive to light. Conjunctivae are normal. No scleral icterus.  Neck: Neck supple. No JVD present. No tracheal deviation present.  Cardiovascular: Normal rate, regular rhythm, normal heart sounds and intact distal pulses.  No murmur heard. Pulmonary/Chest: Effort normal. No accessory muscle usage or stridor. No tachypnea. No respiratory distress. He has no wheezes. He has no rhonchi. He has no rales.  Diminished bilaterally, Barrel chest, scattered anterior rhonchi  Abdominal: Soft. Bowel sounds are normal. He exhibits no distension. There is no tenderness.  Musculoskeletal: He exhibits no edema or tenderness.  Lymphadenopathy:    He has no cervical adenopathy.  Neurological: He is alert and oriented to person, place, and time.  Skin: Skin is warm and dry. Capillary refill takes less than 2 seconds. No rash noted.  Psychiatric: He has a normal mood and affect. His behavior is normal.  Vitals reviewed.    Vitals:   03/12/18 1610  BP: 120/70  Pulse: 73  SpO2: 96%    Weight: 217 lb (98.4 kg)  Height: 5\' 7"  (1.702 m)   96% on RA BMI Readings from Last 3 Encounters:  03/12/18 33.99 kg/m  02/03/18 32.28 kg/m  10/05/16 33.23 kg/m   Wt Readings from Last 3 Encounters:  03/12/18 217 lb (98.4 kg)  02/03/18 215 lb 6.4 oz (97.7 kg)  10/05/16 225 lb (102.1 kg)    CBC    Component Value Date/Time   WBC 7.9 11/07/2014 1358   RBC 5.44 11/07/2014 1358   HGB 16.3 11/07/2014 1358   HCT 49.0 11/07/2014 1358   PLT 202 11/07/2014 1358   MCV 90.1 11/07/2014 1358   MCH 30.0 11/07/2014 1358   MCHC 33.3 11/07/2014 1358   RDW 12.8 11/07/2014 1358   LYMPHSABS 1.7 11/07/2014 1358   MONOABS 0.6 11/07/2014 1358   EOSABS 0.2 11/07/2014 1358   BASOSABS 0.0 11/07/2014 1358    Chest Imaging:  11/07/2014 IMPRESSION: No active cardiopulmonary disease.  02/18/2018: CT chest No acute cardiopulmonary disease, evidence of centrilobular emphysema The patient's images have been independently reviewed by me.    Pulmonary Functions Testing Results: 03/12/2018: Postbronchodilator responses recorded FVC 3.12 L 71% predicted FEV1 2.4 L 71% predicted, 20% bronchodilator response Ratio 77 SVC 69% TLC 68% RV/TLC ratio 125% ERV 16% DLCO 59%  FeNO: None   Pathology: None   Echocardiogram:   Study Conclusions - Left ventricle: Technically difficult study. The cavity size was   normal. Wall thickness was increased in a pattern of mild LVH.   The estimated ejection fraction was 60%. Wall motion was normal;   there were no regional wall motion abnormalities. - Right ventricle: The cavity size was normal. Systolic function   was normal. -  Impressions: No cardiac source of embolism was identified, but   cannot be ruled out on the basis of this examination. Impressions: - No cardiac source of embolism was identified, but cannot be ruled   out on the basis of this examination.  Heart Catheterization: None     Assessment & Plan:   DOE (dyspnea on  exertion)  Tobacco abuse - Plan: Ambulatory Referral for Lung Cancer Scre  Mixed restrictive and obstructive lung disease (HCC)  Tobacco use - Plan: CANCELED: CT CHEST LUNG CA SCREEN LOW DOSE W/O CM  Centrilobular emphysema (HCC)  Discussion:  Today we reviewed his PFTs. They are consistent with a mixed restrictive/ obstructive defect.  He has both reduced spirometry, reduced FEV1 and FVC, however does have a significant bronchodilator response.  He does have evidence of air trapping and reduced DLCO.  Reduced DLCO is likely related to the centrilobular emphysema seen on CT.  Today we reviewed his CAT scan imaging as well as PFTs during this encounter.  So far no etiology found of his weight loss.  He does feel that may be he has been eating less than normal.  This needed continue to be observed.  His primary care doctor is aware per patient.  Plan: Inhaler training today with nursing staff.  Patient was instructed on how to use these with appropriate technique. Patient was counseled on smoking cessation.  Greater than 10 minutes of this evaluation was used for smoking cessation counseling Continue Spiriva Respimat and Symbicort Continue albuterol as needed Referral to lung cancer screening program Will need low-dose lung cancer screening CT in 12 months Return to clinic with Kandice Robinsons, NP in 3 months, for continued tobacco use cessation counseling.   Current Outpatient Medications:  .  albuterol (PROVENTIL HFA;VENTOLIN HFA) 108 (90 Base) MCG/ACT inhaler, Inhale 2 puffs into the lungs every 6 (six) hours as needed for wheezing or shortness of breath., Disp: 1 Inhaler, Rfl: 6 .  ALPRAZolam (XANAX) 1 MG tablet, Take 1 mg by mouth 3 (three) times daily as needed for anxiety. , Disp: , Rfl:  .  atorvastatin (LIPITOR) 20 MG tablet, Take 1 tablet (20 mg total) by mouth daily., Disp: 30 tablet, Rfl: 6 .  clopidogrel (PLAVIX) 75 MG tablet, Take 1 tablet (75 mg total) by mouth daily., Disp: 30  tablet, Rfl: 0 .  gabapentin (NEURONTIN) 300 MG capsule, Take 300 mg by mouth 3 (three) times daily., Disp: , Rfl: 2 .  meloxicam (MOBIC) 15 MG tablet, Take 15 mg by mouth daily., Disp: , Rfl:  .  oxyCODONE (ROXICODONE) 5 MG immediate release tablet, Take 1-2 tablets (5-10 mg total) by mouth every 4 (four) hours as needed for moderate pain or severe pain., Disp: 60 tablet, Rfl: 0 .  pantoprazole (PROTONIX) 40 MG tablet, Take 40 mg by mouth daily., Disp: , Rfl:  .  Spacer/Aero-Holding Chambers (AEROCHAMBER MINI CHAMBER) DEVI, 1 Device by Does not apply route as directed., Disp: 1 Device, Rfl: 0 .  Tiotropium Bromide Monohydrate (SPIRIVA RESPIMAT) 2.5 MCG/ACT AERS, Inhale 1 puff into the lungs 2 (two) times daily., Disp: 2 Inhaler, Rfl: 0 .  TRICOR 145 MG tablet, Take 1 tablet (145 mg total) by mouth daily., Disp: 30 tablet, Rfl: 6 .  Vitamin D, Ergocalciferol, (DRISDOL) 50000 UNITS CAPS capsule, TAKE ONE CAPSULE BY MOUTH WEEKLY, Disp: , Rfl:    [name], DO [facility] 03/12/2018 4:44 PM

## 2018-03-12 NOTE — Addendum Note (Signed)
Addended by: Kerin Ransom on: 03/12/2018 04:56 PM   Modules accepted: Orders

## 2018-03-25 ENCOUNTER — Telehealth: Payer: Self-pay | Admitting: Pulmonary Disease

## 2018-03-25 NOTE — Telephone Encounter (Signed)
PCCM:  Ok to switch to [employer].   Thanks  [name], DO [facility] 03/25/2018 6:00 PM

## 2018-03-25 NOTE — Telephone Encounter (Signed)
Pt's insurance is not covering Spiriva Respimat. Per Medicaid guidelines, the pt has to try and fail 2 preferred medications. Preferred medications are >> Atrovent HFA, Bevespi, Combivent Respimat, Spiriva Handihaler and Stiolto.  Dr. Tonia Brooms - please advise. Thanks.

## 2018-03-29 MED ORDER — TIOTROPIUM BROMIDE-OLODATEROL 2.5-2.5 MCG/ACT IN AERS: 2.0000 | INHALATION_SPRAY | Freq: Every day | RESPIRATORY_TRACT | 3 refills | Status: DC

## 2018-03-29 NOTE — Telephone Encounter (Signed)
Medication has been changed per Dr. Tonia Brooms. Nothing further was needed.

## 2018-06-14 ENCOUNTER — Encounter: Payer: Self-pay | Admitting: Acute Care

## 2018-06-14 ENCOUNTER — Ambulatory Visit: Payer: Medicaid Other | Admitting: Acute Care

## 2018-06-14 ENCOUNTER — Ambulatory Visit (INDEPENDENT_AMBULATORY_CARE_PROVIDER_SITE_OTHER): Payer: Medicaid Other | Admitting: Acute Care

## 2018-06-14 DIAGNOSIS — R0609 Other forms of dyspnea: Secondary | ICD-10-CM

## 2018-06-14 DIAGNOSIS — F17210 Nicotine dependence, cigarettes, uncomplicated: Secondary | ICD-10-CM

## 2018-06-14 DIAGNOSIS — Z720 Tobacco use: Secondary | ICD-10-CM

## 2018-06-14 MED ORDER — TIOTROPIUM BROMIDE MONOHYDRATE 1.25 MCG/ACT IN AERS: 2.0000 | INHALATION_SPRAY | Freq: Every day | RESPIRATORY_TRACT | 2 refills | Status: DC

## 2018-06-14 NOTE — Progress Notes (Signed)
History of Present Illness Ian [name] is a 57 y.o. 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 He is followed by Dr. Tonia Brooms.   Synopsis Referred for dyspnea and weight loss Of note, patient cannot read or write>> needs verbal or visual clues    06/14/2018  3 month follow up Pt. Presents for follow up. He was seen by Dr. Tonia Brooms 03/12/2018 for dyspnea. He was started on  Spiriva Respimat and Symbicort , with albuterol as rescue at his last appointment. He states he feels the inhalers are helping. He states he was unable to get Spiriva respimat due to his insurance ( Medicaid). He does not know if it was a donut hole issue or some other reason for lack of coverage.He states he has been compliant with his Symbicort 2 puffs twice daily and he has only used his rescue inhaler once or twice.He states he tries to power through instead of using his rescue inhaler. He endorses that he could possibly be using it more. He did feel he had better control when using both the Symbicort and Spiriva. He states he does have some wheezing also. He states his wheezing is more in the mornings. He is on protonix.He states he is compliant with this. He denies fever, chest pain, orthopnea or hemoptysis.   Test Results: PFTs. 02/2018 Consistent with a mixed restrictive/ obstructive defect.  He has both reduced spirometry, reduced FEV1 and FVC, however does have a significant bronchodilator response. He does have evidence of air trapping and reduced DLCO.  Reduced DLCO is likely related to the centrilobular emphysema seen on CT.  CT Scan 02/2018 Lungs/Pleura: No focal consolidation, pleural effusion or pneumothorax. Centrilobular emphysema.  CBC Latest Ref Rng & Units 11/07/2014 05/24/2013 02/11/2013  WBC 4.0 - 10.5 K/uL 7.9 - 7.2  Hemoglobin 13.0 - 17.0 g/dL 96.2 95.2 84.1  Hematocrit 39.0 - 52.0 % 49.0 49.0 45.4  Platelets 150 - 400 K/uL 202 - 225    BMP Latest Ref  Rng & Units 11/07/2014 05/24/2013 02/11/2013  Glucose 65 - 99 mg/dL 96 92 324(M)  BUN 6 - 20 mg/dL 12 15 13   Creatinine 0.61 - 1.24 mg/dL 0.10 2.72 5.36  Sodium 135 - 145 mmol/L 137 138 137  Potassium 3.5 - 5.1 mmol/L 4.2 4.0 3.9  Chloride 101 - 111 mmol/L 105 104 102  CO2 22 - 32 mmol/L 27 - 26  Calcium 8.9 - 10.3 mg/dL 6.4(Q) - 9.0    BNP No results found for: BNP  ProBNP No results found for: PROBNP  PFT    Component Value Date/Time   FEV1PRE 1.99 03/12/2018 1503   FEV1POST 2.40 03/12/2018 1503   FVCPRE 2.68 03/12/2018 1503   FVCPOST 3.12 03/12/2018 1503   DLCOUNC 16.81 03/12/2018 1503   PREFEV1FVCRT 74 03/12/2018 1503   PSTFEV1FVCRT 77 03/12/2018 1503    No results found.   Past medical hx Past Medical History:  Diagnosis Date  . Hx of cardiovascular stress test    Lexiscan Myoview (09/2013):  No ischemia, EF 62%; NORMAL  . Leg pain    ABIs (09/29/13):  Normal.  . Stomach ulcer   . Stroke (HCC)   . Tobacco abuse      Social History   Tobacco Use  . Smoking status: Current Every Day Smoker    Packs/day: 1.00    Years: 35.00    Pack years: 35.00    Types: Cigarettes  . Smokeless tobacco: Never  Used  . Tobacco comment: smokes 1 ppd 8.21.19  Substance Use Topics  . Alcohol use: Not Currently    Alcohol/week: 9.0 - 13.0 standard drinks    Types: 1 Glasses of wine, 8 - 12 Cans of beer per week    Comment: Drank beer, for 35 years, quit 5 years ago   . Drug use: No    Mr.Yardley reports that he has been smoking cigarettes. He has a 35.00 pack-year smoking history. He has never used smokeless tobacco. He reports previous alcohol use of about 9.0 - 13.0 standard drinks of alcohol per week. He reports that he does not use drugs.  Tobacco Cessation: Current every day smoker  Smokes 1 PPD 35 pack year smoking history  Past surgical hx, Family hx, Social hx all reviewed.  Current Outpatient Medications on File Prior to Visit  Medication Sig  . albuterol  (PROVENTIL HFA;VENTOLIN HFA) 108 (90 Base) MCG/ACT inhaler Inhale 2 puffs into the lungs every 6 (six) hours as needed for wheezing or shortness of breath.  . ALPRAZolam (XANAX) 1 MG tablet Take 1 mg by mouth 3 (three) times daily as needed for anxiety.   Marland Kitchen. atorvastatin (LIPITOR) 20 MG tablet Take 1 tablet (20 mg total) by mouth daily.  . budesonide-formoterol (SYMBICORT) 80-4.5 MCG/ACT inhaler Inhale 2 puffs into the lungs every 12 (twelve) hours.  . clopidogrel (PLAVIX) 75 MG tablet Take 1 tablet (75 mg total) by mouth daily.  Marland Kitchen. gabapentin (NEURONTIN) 300 MG capsule Take 300 mg by mouth 3 (three) times daily.  . meloxicam (MOBIC) 15 MG tablet Take 15 mg by mouth daily.  Marland Kitchen. oxyCODONE (ROXICODONE) 5 MG immediate release tablet Take 1-2 tablets (5-10 mg total) by mouth every 4 (four) hours as needed for moderate pain or severe pain.  . pantoprazole (PROTONIX) 40 MG tablet Take 40 mg by mouth daily.  Marland Kitchen. Spacer/Aero-Holding Chambers (AEROCHAMBER MINI CHAMBER) DEVI 1 Device by Does not apply route as directed.  . Tiotropium Bromide Monohydrate (SPIRIVA RESPIMAT) 2.5 MCG/ACT AERS Inhale 2 puffs into the lungs daily.  . Tiotropium Bromide-Olodaterol (STIOLTO RESPIMAT) 2.5-2.5 MCG/ACT AERS Inhale 2 puffs into the lungs daily.  . TRICOR 145 MG tablet Take 1 tablet (145 mg total) by mouth daily.  . Vitamin D, Ergocalciferol, (DRISDOL) 50000 UNITS CAPS capsule TAKE ONE CAPSULE BY MOUTH WEEKLY   No current facility-administered medications on file prior to visit.      Allergies  Allergen Reactions  . Asa [Aspirin] Other (See Comments)  . Ibuprofen Other (See Comments)  . Tylenol [Acetaminophen] Other (See Comments)    Review Of Systems:  Constitutional:   No  weight loss, night sweats,  Fevers, chills, fatigue, or  lassitude.  HEENT:   No headaches,  Difficulty swallowing,  Tooth/dental problems, or  Sore throat,                No sneezing, itching, ear ache, nasal congestion, post nasal drip,    CV:  No chest pain,  Orthopnea, PND, swelling in lower extremities, anasarca, dizziness, palpitations, syncope.   GI  No heartburn, indigestion, abdominal pain, nausea, vomiting, diarrhea, change in bowel habits, loss of appetite, bloody stools.   Resp: Rare shortness of breath with exertion less at rest.  + baseline  excess mucus, no productive cough,  No non-productive cough,  No coughing up of blood.  No change in color of mucus.  Occasional  wheezing.  No chest wall deformity  Skin: no rash or lesions.  GU:  no dysuria, change in color of urine, no urgency or frequency.  No flank pain, no hematuria   MS:  No joint pain or swelling.  No decreased range of motion.  No back pain.  Psych:  No change in mood or affect. No depression or anxiety.  No memory loss.   Vital Signs BP 122/62 (BP Location: Left Arm, Cuff Size: Large)   Pulse 64   Ht 5\' 7"  (1.702 m)   Wt 223 lb 9.6 oz (101.4 kg)   SpO2 95%   BMI 35.02 kg/m    Physical Exam:  General- No distress,  A&Ox3, pleasant ENT: No sinus tenderness, TM clear, pale nasal mucosa, no oral exudate,no post nasal drip, no LAN Cardiac: S1, S2, regular rate and rhythm, no murmur Chest: + wheeze/no  rales/ dullness; no accessory muscle use, no nasal flaring, no sternal retractions Abd.: Soft Non-tender, ND, BS + Ext: No clubbing cyanosis, edema, no obvious deformities Neuro:  normal strength, MAE x 4, Alert and appropriate Skin: No rashes, warm and dry Psych: normal mood and behavior   Assessment/Plan  DOE (dyspnea on exertion) Better with [employer] issues with getting Spiriva Plan Continue Symbicort 2 puffs in the morning and in the evening without fail Rinse mouth after use. We will give you samples of Spiriva to use. 2 puffs once daily We will resend a prescription for Spiriva to see if it will be covered by Medicare after 06/2018 If they continue to refuse to cover the Spiriva, please call the office so we can  determine a replacement medication. Continue albuterol ( Yellow inhaler) 2 puffs as needed for breakthrough shortness of breath or wheezing up to 4 times a day. Follow up in 3 months with Dr. Tonia Brooms to see how you are doing.  Please contact office for sooner follow up if symptoms do not improve or worsen or seek emergency care  Note your daily symptoms > remember "red flags" for COPD:  Increase in cough, increase in sputum production, increase in shortness of breath or activity intolerance. If you notice these symptoms, please call to be seen.    Tobacco abuse Cigarette smoker   We will refer you today to her lung cancer screening program >>>This is based off of your ** pack-year smoking history >>> This is a recommendation from the [place] preventative services task force (USPSTF) >>>The USPSTF recommends annual screening for lung cancer with low-dose computed tomography (LDCT) in adults aged 74 to 80 years who have a 30 pack-year smoking history and currently smoke or have quit within the past 15 years. Screening should be discontinued once a person has not smoked for 15 years or develops a health problem that substantially limits life expectancy or the ability or willingness to have curative lung surgery.    Our office will call you and set up an appointment with Kandice Robinsons (Nurse Practitioner) who leads this program.  This appointment takes place in our office.   After completing this meeting with Kandice Robinsons NP you will get a low-dose CT as the screening >>>We will call you with those results     We recommend that you stop smoking.  >>>You need to set a quit date >>>If you have friends or family who smoke, let them know you are trying to quit and not to smoke around you or in your living environment   Smoking Cessation Resources:  1 800 QUIT NOW  >>> Patient to call this resource and utilize it to help support  her quit smoking >>> Keep up your hard work with stopping smoking   You can  also contact the [facility] >>>For smoking cessation classes call [phone]   We do not recommend using e-cigarettes as a form of stopping smoking   You can sign up for smoking cessation support texts and information:  >>>https://smokefree.gov/smokefreetxt   Nicotine patches: >>>Make sure you rotate sites that you do not get skin irritation, Apply 1 patch each morning to a non-hairy skin site   If you are smoking greater than 10 cigarettes/day and weigh over 45 kg start with the nicotine patch of 21 mg a day for 6 weeks, then 14 mg a day for 2 weeks, then finished with 7 mg a day for 2 weeks, then stop   If you are smoking less than 10 cigarettes a day or weight less than 45 kg start with medium dose pack of 14 mg a day for 6 weeks, followed by 7 mg a day for 2 weeks    >>>If insomnia occurs you are having trouble sleeping you can take the patch off at night, and place a new one on in the morning >>>If the patch is removed at night and you have morning cravings start short acting nicotine replacement therapy such as gum or lozenges   Nicotine Gum:  >>>Smokers who smoke 25 or more cigarettes a day should use 4 mg dose >>>Smokers who smoke fewer than 25 cigarettes a day should use 2 mg dose   Proper chewing of gum is important for optimal results.   >>>Do not chew gum to rapidly.  Once you start chewing eating tasty peppery taste and slide the gum to your cheek.  When the taste disappears to a few more times.  Repeat this for 30 minutes.  Then discard the gum.   >>>Avoid acidic beverages such as coffee, carbonated beverages before and during gum use.  A soft acidic beverages lower oral pH which cause nicotine to not be absorbed properly >>>If you chew the gum too quickly or vigorously you could have nausea, vomiting, abdominal pain, constipation, hiccups, headache, sore jaw, mouth irritation ulcers   Nicotine lozenge: Lozenges are commonly uses short acting NRT product    >>>Smokers who smoke within 30 minutes of awakening should use 4 mg dose >>>Smokers who wait more than 30 minutes after awakening to smoke should use 2 mg dose   Can use up to 1 lozenge every 1-2 hours for 6 weeks >>>Total amount of lozenges that can be used per day as 20 >>>Gradually reduce number of lozenges used per day after 2 weeks of use   Place lozenge in mouth and allowed to dissolve for 30 minutes loss and does not need to be chewed   Lozenges have advantages to be able to be used in people with TMG, poor dentition, dentures        Bevelyn NgoSarah F Bertrice Leder, NP 06/14/2018  10:58 AM

## 2018-06-14 NOTE — Patient Instructions (Addendum)
It is nice to meet you today. Continue Symbicort 2 puffs in the morning and in the evening without fail Rinse mouth after use. We will give you samples of Spiriva to use. 2 puffs once daily We will resend a prescription for Spiriva to see if it will be covered by Medicare after 06/2018 If they continue to refuse to cover the Spiriva, please call the office so we can determine a replacement medication. Continue albuterol ( Yellow inhaler) 2 puffs as needed for breakthrough shortness of breath or wheezing up to 4 times a day. Follow up in 3 months with Dr. Tonia BroomsIcard to see how you are doing.  Please contact office for sooner follow up if symptoms do not improve or worsen or seek emergency care   Cigarette smoker   We will refer you today to her lung cancer screening program >>>This is based off of your 35  pack-year smoking history >>> This is a recommendation from the [place] preventative services task force (USPSTF) >>>The USPSTF recommends annual screening for lung cancer with low-dose computed tomography (LDCT) in adults aged 57 to 80 years who have a 30 pack-year smoking history and currently smoke or have quit within the past 15 years. Screening should be discontinued once a person has not smoked for 15 years or develops a health problem that substantially limits life expectancy or the ability or willingness to have curative lung surgery.    Our office will call you and set up an appointment with Kandice RobinsonsSarah Betha Shadix (Nurse Practitioner) who leads this program.  This appointment takes place in our office.   After completing this meeting with Kandice RobinsonsSarah Doy Taaffe NP you will get a low-dose CT as the screening >>>We will call you with those results     We recommend that you stop smoking.  >>>You need to set a quit date >>>If you have friends or family who smoke, let them know you are trying to quit and not to smoke around you or in your living environment   Smoking Cessation Resources:  1 800 QUIT NOW  >>>  Patient to call this resource and utilize it to help support her quit smoking >>> Keep up your hard work with stopping smoking   You can also contact the [facility] >>>For smoking cessation classes call [phone]   We do not recommend using e-cigarettes as a form of stopping smoking   You can sign up for smoking cessation support texts and information:  >>>https://smokefree.gov/smokefreetxt   Nicotine patches: >>>Make sure you rotate sites that you do not get skin irritation, Apply 1 patch each morning to a non-hairy skin site   If you are smoking greater than 10 cigarettes/day and weigh over 45 kg start with the nicotine patch of 21 mg a day for 6 weeks, then 14 mg a day for 2 weeks, then finished with 7 mg a day for 2 weeks, then stop   If you are smoking less than 10 cigarettes a day or weight less than 45 kg start with medium dose pack of 14 mg a day for 6 weeks, followed by 7 mg a day for 2 weeks    >>>If insomnia occurs you are having trouble sleeping you can take the patch off at night, and place a new one on in the morning >>>If the patch is removed at night and you have morning cravings start short acting nicotine replacement therapy such as gum or lozenges   Nicotine Gum:  >>>Smokers who smoke 25 or  more cigarettes a day should use 4 mg dose >>>Smokers who smoke fewer than 25 cigarettes a day should use 2 mg dose   Proper chewing of gum is important for optimal results.   >>>Do not chew gum to rapidly.  Once you start chewing eating tasty peppery taste and slide the gum to your cheek.  When the taste disappears to a few more times.  Repeat this for 30 minutes.  Then discard the gum.   >>>Avoid acidic beverages such as coffee, carbonated beverages before and during gum use.  A soft acidic beverages lower oral pH which cause nicotine to not be absorbed properly >>>If you chew the gum too quickly or vigorously you could have nausea, vomiting, abdominal pain,  constipation, hiccups, headache, sore jaw, mouth irritation ulcers   Nicotine lozenge: Lozenges are commonly uses short acting NRT product   >>>Smokers who smoke within 30 minutes of awakening should use 4 mg dose >>>Smokers who wait more than 30 minutes after awakening to smoke should use 2 mg dose   Can use up to 1 lozenge every 1-2 hours for 6 weeks >>>Total amount of lozenges that can be used per day as 20 >>>Gradually reduce number of lozenges used per day after 2 weeks of use   Place lozenge in mouth and allowed to dissolve for 30 minutes loss and does not need to be chewed   Lozenges have advantages to be able to be used in people with TMG, poor dentition, dentures  Note your daily symptoms > remember "red flags" for COPD:  Increase in cough, increase in sputum production, increase in shortness of breath or activity intolerance. If you notice these symptoms, please call to be seen.

## 2018-06-14 NOTE — Assessment & Plan Note (Signed)
Better with [employer] issues with getting Spiriva Plan Continue Symbicort 2 puffs in the morning and in the evening without fail Rinse mouth after use. We will give you samples of Spiriva to use. 2 puffs once daily We will resend a prescription for Spiriva to see if it will be covered by Medicare after 06/2018 If they continue to refuse to cover the Spiriva, please call the office so we can determine a replacement medication. Continue albuterol ( Yellow inhaler) 2 puffs as needed for breakthrough shortness of breath or wheezing up to 4 times a day. Follow up in 3 months with Dr. Tonia BroomsIcard to see how you are doing.  Please contact office for sooner follow up if symptoms do not improve or worsen or seek emergency care  Note your daily symptoms > remember "red flags" for COPD:  Increase in cough, increase in sputum production, increase in shortness of breath or activity intolerance. If you notice these symptoms, please call to be seen.

## 2018-06-14 NOTE — Assessment & Plan Note (Signed)
Cigarette smoker   We will refer you today to her lung cancer screening program >>>This is based off of your ** pack-year smoking history >>> This is a recommendation from the [place] preventative services task force (USPSTF) >>>The USPSTF recommends annual screening for lung cancer with low-dose computed tomography (LDCT) in adults aged 57 to 80 years who have a 30 pack-year smoking history and currently smoke or have quit within the past 15 years. Screening should be discontinued once a person has not smoked for 15 years or develops a health problem that substantially limits life expectancy or the ability or willingness to have curative lung surgery.    Our office will call you and set up an appointment with Kandice RobinsonsSarah Groce (Nurse Practitioner) who leads this program.  This appointment takes place in our office.   After completing this meeting with Kandice RobinsonsSarah Groce NP you will get a low-dose CT as the screening >>>We will call you with those results     We recommend that you stop smoking.  >>>You need to set a quit date >>>If you have friends or family who smoke, let them know you are trying to quit and not to smoke around you or in your living environment   Smoking Cessation Resources:  1 800 QUIT NOW  >>> Patient to call this resource and utilize it to help support her quit smoking >>> Keep up your hard work with stopping smoking   You can also contact the [facility] >>>For smoking cessation classes call [phone]   We do not recommend using e-cigarettes as a form of stopping smoking   You can sign up for smoking cessation support texts and information:  >>>https://smokefree.gov/smokefreetxt   Nicotine patches: >>>Make sure you rotate sites that you do not get skin irritation, Apply 1 patch each morning to a non-hairy skin site   If you are smoking greater than 10 cigarettes/day and weigh over 45 kg start with the nicotine patch of 21 mg a day for 6 weeks, then 14 mg a day  for 2 weeks, then finished with 7 mg a day for 2 weeks, then stop   If you are smoking less than 10 cigarettes a day or weight less than 45 kg start with medium dose pack of 14 mg a day for 6 weeks, followed by 7 mg a day for 2 weeks    >>>If insomnia occurs you are having trouble sleeping you can take the patch off at night, and place a new one on in the morning >>>If the patch is removed at night and you have morning cravings start short acting nicotine replacement therapy such as gum or lozenges   Nicotine Gum:  >>>Smokers who smoke 25 or more cigarettes a day should use 4 mg dose >>>Smokers who smoke fewer than 25 cigarettes a day should use 2 mg dose   Proper chewing of gum is important for optimal results.   >>>Do not chew gum to rapidly.  Once you start chewing eating tasty peppery taste and slide the gum to your cheek.  When the taste disappears to a few more times.  Repeat this for 30 minutes.  Then discard the gum.   >>>Avoid acidic beverages such as coffee, carbonated beverages before and during gum use.  A soft acidic beverages lower oral pH which cause nicotine to not be absorbed properly >>>If you chew the gum too quickly or vigorously you could have nausea, vomiting, abdominal pain, constipation, hiccups, headache, sore jaw, mouth irritation  ulcers   Nicotine lozenge: Lozenges are commonly uses short acting NRT product   >>>Smokers who smoke within 30 minutes of awakening should use 4 mg dose >>>Smokers who wait more than 30 minutes after awakening to smoke should use 2 mg dose   Can use up to 1 lozenge every 1-2 hours for 6 weeks >>>Total amount of lozenges that can be used per day as 20 >>>Gradually reduce number of lozenges used per day after 2 weeks of use   Place lozenge in mouth and allowed to dissolve for 30 minutes loss and does not need to be chewed   Lozenges have advantages to be able to be used in people with TMG, poor dentition, dentures

## 2018-06-18 NOTE — Progress Notes (Signed)
PCCM: Agree. Thanks for seeing.  [name], DO [facility] 06/18/2018 8:17 PM

## 2018-07-06 ENCOUNTER — Telehealth: Payer: Self-pay | Admitting: Pulmonary Disease

## 2018-07-06 NOTE — Telephone Encounter (Signed)
Spoke with Davima  She states pt needing PA on spiriva 2.5  Ins number [phone] ([place] [employer]) ID # [id number]  Called [place] [employer] to initiate PA [id number] interaction number   Medication was approved  Auth number [id number] from today until 07/01/2019  Called pharm and informed them of approval  Nothing further needed

## 2018-08-19 ENCOUNTER — Other Ambulatory Visit (HOSPITAL_COMMUNITY): Payer: Self-pay | Admitting: Family Medicine

## 2018-08-19 ENCOUNTER — Ambulatory Visit (HOSPITAL_COMMUNITY)
Admission: RE | Admit: 2018-08-19 | Discharge: 2018-08-19 | Disposition: A | Discharge status: Home / Self Care | Payer: Medicaid Other | Source: Ambulatory Visit | Attending: Family Medicine | Admitting: Family Medicine

## 2018-08-19 DIAGNOSIS — M5441 Lumbago with sciatica, right side: Secondary | ICD-10-CM

## 2018-09-15 ENCOUNTER — Ambulatory Visit: Payer: Medicaid Other | Admitting: Pulmonary Disease

## 2018-10-27 ENCOUNTER — Encounter: Payer: Self-pay | Admitting: Orthopaedic Surgery

## 2018-10-27 ENCOUNTER — Ambulatory Visit (INDEPENDENT_AMBULATORY_CARE_PROVIDER_SITE_OTHER): Payer: Medicaid Other

## 2018-10-27 ENCOUNTER — Other Ambulatory Visit: Payer: Self-pay

## 2018-10-27 ENCOUNTER — Ambulatory Visit (INDEPENDENT_AMBULATORY_CARE_PROVIDER_SITE_OTHER): Payer: Medicaid Other | Admitting: Orthopaedic Surgery

## 2018-10-27 VITALS — Ht 67.5 in | Wt 215.0 lb

## 2018-10-27 DIAGNOSIS — M25512 Pain in left shoulder: Secondary | ICD-10-CM

## 2018-10-27 MED ORDER — HYDROCODONE-ACETAMINOPHEN 5-325 MG PO TABS: 1.0000 | ORAL_TABLET | Freq: Four times a day (QID) | ORAL | 0 refills | Status: DC | PRN

## 2018-10-27 MED ORDER — METHYLPREDNISOLONE ACETATE 40 MG/ML IJ SUSP
40.0000 mg | INTRAMUSCULAR | Status: AC | PRN
  Administered 2018-10-27: 40 mg via INTRA_ARTICULAR

## 2018-10-27 MED ORDER — LIDOCAINE HCL 1 % IJ SOLN
3.0000 mL | INTRAMUSCULAR | Status: AC | PRN
  Administered 2018-10-27: 15:00:00 3 mL

## 2018-10-27 NOTE — Progress Notes (Signed)
Office Visit Note   Patient: Ian [name]           Date of Birth: 12-Aug-1960           MRN: [id number] Visit Date: 10/27/2018              Requested by: Joette Catching, MD [address] [place], [place] [address] PCP: Joette Catching, MD   Assessment & Plan: Visit Diagnoses:  1. Acute pain of left shoulder     Plan: This seems to be likely an acute flareup of tendinitis and impingement of the left shoulder.  I did offer a steroid injection in the subacromial space and explained the risk and benefits of injections.  These have helped greatly in the past so he is agreeable to this and tolerated it well.  I will also send in just a short course of hydrocodone that he knows to use sparingly.  All question concerns were answered and addressed.  Follow-up can be as needed.  If his shoulder pain worsens in any way he will come back and see [place] for repeat evaluation and likely will end up needing an MRI.  Follow-Up Instructions: Return if symptoms worsen or fail to improve.   Orders:  Orders Placed This Encounter  Procedures   Large Joint Inj   XR Shoulder Left   Meds ordered this encounter  Medications   HYDROcodone-acetaminophen (NORCO/VICODIN) 5-325 MG tablet    Sig: Take 1-2 tablets by mouth every 6 (six) hours as needed for moderate pain.    Dispense:  30 tablet    Refill:  0      Procedures: Large Joint Inj: L subacromial bursa on 10/27/2018 3:00 PM Indications: pain and diagnostic evaluation Details: 22 G 1.5 in needle  Arthrogram: No  Medications: 3 mL lidocaine 1 %; 40 mg methylPREDNISolone acetate 40 MG/ML Outcome: tolerated well, no immediate complications Procedure, treatment alternatives, risks and benefits explained, specific risks discussed. Consent was given by the patient. Immediately prior to procedure a time out was called to verify the correct patient, procedure, equipment, support staff and site/side marked as required. Patient was prepped and draped  in the usual sterile fashion.       Clinical Data: No additional findings.   Subjective: Chief Complaint  Patient presents with   Left Shoulder - Pain  The patient comes in today with a 3-day history of worsening left shoulder pain with no known injury.  Actually saw him for the shoulder in 2018 but no x-rays were obtained.  We provided a steroid injection in the subacromial space of his left shoulder then.  He did much better after that.  He says is been slowly getting worse for 3 days now to the point that now he cannot stand the pain anymore.  It hurts with range of motion and it with overhead activities.  It hurts reaching behind as well.  He has had some chronic issues with that shoulder due to heavy lifting that he did years ago.  He cannot take any anti-inflammatories due to other issues medically and allergies as well.  He says the pain is waking up at night.  HPI  Review of Systems He currently denies any headache, chest pain, shortness of breath, fever, chills, nausea, vomiting  Objective: Vital Signs: Ht 5' 7.5" (1.715 m)    Wt 215 lb (97.5 kg)    BMI 33.18 kg/m   Physical Exam He is alert and orient x3 and in no acute distress Ortho  Exam Examination of his left shoulder shows pain with internal rotation abduction as well as overhead activities and reaching across.  His shoulder appears clinically located and there is no gross deficits of the rotator cuff. Specialty Comments:  No specialty comments available.  Imaging: Xr Shoulder Left  Result Date: 10/27/2018 3 views of the left shoulder show well located shoulder with no acute findings.    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   Stomach ulcer    Stroke (HCC)    Tobacco abuse     Family History  Problem Relation Age of Onset   CAD Father [id number]       Died of MI   Heart failure Mother [id number]       Alive   Diabetes Mother    Diabetes Sister    CAD Paternal Grandfather [id number]       Died of MI   Heart disease Daughter        Rapid heart rate   CAD Brother    Cancer Maternal Grandmother        Throat cancer     Past Surgical History:  Procedure Laterality Date   ANKLE FRACTURE SURGERY     APPENDECTOMY     Social History   Occupational History    Employer: NOT EMPLOYED  Tobacco Use   Smoking status: Current Every Day Smoker    Packs/day: 1.00    Years: 35.00    Pack years: 35.00    Types: Cigarettes   Smokeless tobacco: Never Used   Tobacco comment: smokes 1 ppd 8.21.19  Substance and Sexual Activity   Alcohol use: Not Currently    Alcohol/week: 9.0 - 13.0 standard drinks    Types: 1 Glasses of wine, 8 - 12 Cans of beer per week    Comment: Drank beer, for 35 years, quit 5 years ago    Drug use: No   Sexual activity: Not on file

## 2019-01-27 ENCOUNTER — Other Ambulatory Visit: Payer: Self-pay

## 2019-01-27 ENCOUNTER — Ambulatory Visit (INDEPENDENT_AMBULATORY_CARE_PROVIDER_SITE_OTHER): Payer: Medicaid Other | Admitting: Pulmonary Disease

## 2019-01-27 ENCOUNTER — Encounter: Payer: Self-pay | Admitting: Pulmonary Disease

## 2019-01-27 VITALS — BP 110/70 | HR 79 | Temp 97.9°F | Ht 69.0 in | Wt 212.2 lb

## 2019-01-27 DIAGNOSIS — J441 Chronic obstructive pulmonary disease with (acute) exacerbation: Secondary | ICD-10-CM

## 2019-01-27 DIAGNOSIS — R062 Wheezing: Secondary | ICD-10-CM | POA: Diagnosis not present

## 2019-01-27 DIAGNOSIS — R0602 Shortness of breath: Secondary | ICD-10-CM | POA: Diagnosis not present

## 2019-01-27 DIAGNOSIS — J449 Chronic obstructive pulmonary disease, unspecified: Secondary | ICD-10-CM

## 2019-01-27 DIAGNOSIS — F17200 Nicotine dependence, unspecified, uncomplicated: Secondary | ICD-10-CM

## 2019-01-27 MED ORDER — PREDNISONE 10 MG PO TABS: 40.0000 mg | ORAL_TABLET | Freq: Every day | ORAL | 0 refills | Status: AC

## 2019-01-27 MED ORDER — AZITHROMYCIN 250 MG PO TABS: 250.0000 mg | ORAL_TABLET | Freq: Every day | ORAL | 0 refills | Status: DC

## 2019-01-27 MED ORDER — ANORO ELLIPTA 62.5-25 MCG/INH IN AEPB: 1.0000 | INHALATION_SPRAY | Freq: Every day | RESPIRATORY_TRACT | 0 refills | Status: DC

## 2019-01-27 NOTE — Progress Notes (Signed)
Patient seen in the office today and instructed on use of Anoro.  Patient expressed understanding and demonstrated technique. 

## 2019-01-27 NOTE — Patient Instructions (Addendum)
Thank you for visiting Dr. Valeta Harms at [facility]. Today we recommend the following:  Orders Placed This Encounter  Procedures  . Ambulatory Referral for Lung Cancer Scre   Meds ordered this encounter  Medications  . predniSONE (DELTASONE) 10 MG tablet    Sig: Take 4 tablets (40 mg total) by mouth daily with breakfast for 5 days.    Dispense:  20 tablet    Refill:  0  . azithromycin (ZITHROMAX) 250 MG tablet    Sig: Take 1 tablet (250 mg total) by mouth daily. Take two first day and one day following    Dispense:  6 tablet    Refill:  0  . umeclidinium-vilanterol (ANORO ELLIPTA) 62.5-25 MCG/INH AEPB    Sig: Inhale 1 puff into the lungs daily.    Dispense:  1 each    Refill:  0   Return in about 4 weeks (around 02/24/2019) for with Eric Form for LDCT Screening program enrollment .    Please do your part to reduce the spread of COVID-19.

## 2019-01-27 NOTE — Addendum Note (Signed)
Addended by: Vivia Ewing on: 01/27/2019 10:31 AM   Modules accepted: Orders

## 2019-01-27 NOTE — Progress Notes (Signed)
Synopsis: Referred in August 2019 for wheezing/SOB by Joette Catching, MD  Subjective:   PATIENT ID: Ian [name] GENDER: male DOB: December 24, 1960, MRN: [id number]  Chief Complaint  Patient presents with   Follow-up    F/U re: DOE. He reports he was confused about how to use his inhalers. Using [employer] and Symbicort daily, albuterol prn. He reports he still has DOE. He has lost approx. 40lbs. He reports he is still smoking.    Initial office visit: PMH of tobacco abuse and CVA. He was recently started on an inhaler, twice in the morning and twice at night. He is still having trouble breathing.   He has been smoking since 58 years old, and by teenager he was smoking regularly. He is currently down to a pack per day. At his max he was smoking 2-3 packs per day, >50 pack year history. He has never had PFTs before or seen a pulmonologist. His biggest trouble with his breathing is with exertion. He occasionally has chest tightness with exertion. He admits to chest pain behind his sternum with climbing up a hill.  His chest pain is associated with shortness of breath.  His shortness of breath is also associated with cough in the morning and cough at night.  He does have mild amount of sputum production that is usually whitish in color.  He wheezes on occasion.  After being started on his inhaler Symbicort 2 puffs twice daily he does feel better.  FMHX father with MI, grandfather with MI and brother with CAD s/p CABGX5.   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  OV 03/12/2018: He is doing some better. He is still smoking. He had PFTs completed today, appears to have a mixed restrictive and obstructive defect. He feels like the inhalers are working. He still has dyspnea on exertion.  He does have breathlessness with climbing stairs or climbing he will.  He is cough may be some better.  He does wake up in the  morning with sputum production which is yellowish in color.  He denies hemoptysis.  I reviewed recent CT imaging with the patient.  There is evidence of centrilobular emphysema.  No found etiology of his weight loss.  The patient does admit that he difficulty with reading.  And has struggled with his medications at home and being able to read and interpret the instructions on the bottles inhalers.  He believes that he is doing them correctly however.  OV 01/27/2019: Patient here today for follow-up of COPD.  Last seen by me back in September.  Otherwise doing well.  Unfortunately has been unable to purchase his medications due to cost.  Patient also has literacy problems and has much difficulty understanding when to use his medications.  We will try to simplify his regimen today.  He is very concerned that he could develop lung cancer at some point.  Unfortunately he is still smoking around 5 cigarettes/day.  He knows that he needs to quit but he is having much difficulty with that as well.  Otherwise he continues to have dyspnea on exertion and shortness of breath.  Occasional wheezing.  He also carries a cup around with him for the amount of chronic sputum that he brings up.  Denies fevers.  But states for the past several weeks he has been feeling much worse.     Past Medical History:  Diagnosis Date  Hx of cardiovascular stress test    Lexiscan Myoview (09/2013):  No ischemia, EF 62%; NORMAL   Leg pain    ABIs (09/29/13):  Normal.   Stomach ulcer    Stroke ([place])    Tobacco abuse      Family History  Problem Relation Age of Onset   CAD Father 24       Died of MI   Heart failure Mother 44       Alive   Diabetes Mother    Diabetes Sister    CAD Paternal Grandfather 7       Died of MI   Heart disease Daughter        Rapid heart rate   CAD Brother    Cancer Maternal Grandmother        Throat cancer      Social History   Socioeconomic History   Marital status:  Divorced    Spouse name: Not on file   Number of children: 5   Years of education: Not on file   Highest education level: Not on file  Occupational History    Employer: NOT EMPLOYED  Social Designer, fashion/clothing strain: Not on file   Food insecurity    Worry: Not on file    Inability: Not on file   Transportation needs    Medical: Not on file    Non-medical: Not on file  Tobacco Use   Smoking status: Current Every Day Smoker    Packs/day: 1.00    Years: 35.00    Pack years: 35.00    Types: Cigarettes   Smokeless tobacco: Never Used   Tobacco comment: smokes 1 ppd 8.21.19  Substance and Sexual Activity   Alcohol use: Not Currently    Alcohol/week: 9.0 - 13.0 standard drinks    Types: 1 Glasses of wine, 8 - 12 Cans of beer per week    Comment: Drank beer, for 35 years, quit 5 years ago    Drug use: No   Sexual activity: Not on file  Lifestyle   Physical activity    Days per week: Not on file    Minutes per session: Not on file   Stress: Not on file  Relationships   Social connections    Talks on phone: Not on file    Gets together: Not on file    Attends religious service: Not on file    Active member of club or organization: Not on file    Attends meetings of clubs or organizations: Not on file    Relationship status: Not on file   Intimate partner violence    Fear of current or ex partner: Not on file    Emotionally abused: Not on file    Physically abused: Not on file    Forced sexual activity: Not on file  Other Topics Concern   Not on file  Social History Narrative   Lives with      Allergies  Allergen Reactions   Asa [Aspirin] Other (See Comments)   Ibuprofen Other (See Comments)   Tylenol [Acetaminophen] Other (See Comments)     Outpatient Medications Prior to Visit  Medication Sig Dispense Refill   albuterol (PROVENTIL HFA;VENTOLIN HFA) 108 (90 Base) MCG/ACT inhaler Inhale 2 puffs into the lungs every 6 (six) hours as  needed for wheezing or shortness of breath. 1 Inhaler 6   ALPRAZolam (XANAX) 1 MG tablet Take 1 mg by mouth 3 (three) times daily as needed for  anxiety.      atorvastatin (LIPITOR) 20 MG tablet Take 1 tablet (20 mg total) by mouth daily. 30 tablet 6   budesonide-formoterol (SYMBICORT) 80-4.5 MCG/ACT inhaler Inhale 2 puffs into the lungs every 12 (twelve) hours. 1 Inhaler 5   clopidogrel (PLAVIX) 75 MG tablet Take 1 tablet (75 mg total) by mouth daily. 30 tablet 0   gabapentin (NEURONTIN) 300 MG capsule Take 300 mg by mouth 3 (three) times daily.  2   pantoprazole (PROTONIX) 40 MG tablet Take 40 mg by mouth daily.     Spacer/Aero-Holding Chambers (AEROCHAMBER MINI CHAMBER) DEVI 1 Device by Does not apply route as directed. 1 Device 0   Tiotropium Bromide-Olodaterol (STIOLTO RESPIMAT) 2.5-2.5 MCG/ACT AERS Inhale 2 puffs into the lungs daily. 3 Inhaler 3   TRICOR 145 MG tablet Take 1 tablet (145 mg total) by mouth daily. 30 tablet 6   Vitamin D, Ergocalciferol, (DRISDOL) 50000 UNITS CAPS capsule TAKE ONE CAPSULE BY MOUTH WEEKLY     HYDROcodone-acetaminophen (NORCO/VICODIN) 5-325 MG tablet Take 1-2 tablets by mouth every 6 (six) hours as needed for moderate pain. (Patient not taking: Reported on 01/27/2019) 30 tablet 0   meloxicam (MOBIC) 15 MG tablet Take 15 mg by mouth daily.     oxyCODONE (ROXICODONE) 5 MG immediate release tablet Take 1-2 tablets (5-10 mg total) by mouth every 4 (four) hours as needed for moderate pain or severe pain. (Patient not taking: Reported on 01/27/2019) 60 tablet 0   Tiotropium Bromide Monohydrate (SPIRIVA RESPIMAT) 1.25 MCG/ACT AERS Inhale 2 puffs into the lungs daily. (Patient not taking: Reported on 01/27/2019) 4 g 2   Tiotropium Bromide Monohydrate (SPIRIVA RESPIMAT) 2.5 MCG/ACT AERS Inhale 2 puffs into the lungs daily. (Patient not taking: Reported on 01/27/2019) 3 Inhaler 3   No facility-administered medications prior to visit.     Review of Systems    Constitutional: Negative for chills, fever, malaise/fatigue and weight loss.  HENT: Negative for hearing loss, sore throat and tinnitus.   Eyes: Negative for blurred vision and double vision.  Respiratory: Positive for cough, shortness of breath and wheezing. Negative for hemoptysis, sputum production and stridor.   Cardiovascular: Negative for chest pain, palpitations, orthopnea, leg swelling and PND.  Gastrointestinal: Negative for abdominal pain, constipation, diarrhea, heartburn, nausea and vomiting.  Genitourinary: Negative for dysuria, hematuria and urgency.  Musculoskeletal: Negative for joint pain and myalgias.  Skin: Negative for itching and rash.  Neurological: Negative for dizziness, tingling, weakness and headaches.  Endo/Heme/Allergies: Negative for environmental allergies. Does not bruise/bleed easily.  Psychiatric/Behavioral: Negative for depression. The patient is not nervous/anxious and does not have insomnia.   All other systems reviewed and are negative.    Objective:  Physical Exam Vitals signs reviewed.  Constitutional:      General: He is not in acute distress.    Appearance: He is well-developed.  HENT:     Head: Normocephalic and atraumatic.  Eyes:     General: No scleral icterus.    Conjunctiva/sclera: Conjunctivae normal.     Pupils: Pupils are equal, round, and reactive to light.  Neck:     Musculoskeletal: Neck supple.     Vascular: No JVD.     Trachea: No tracheal deviation.  Cardiovascular:     Rate and Rhythm: Normal rate and regular rhythm.     Heart sounds: Normal heart sounds. No murmur.  Pulmonary:     Effort: Pulmonary effort is normal. No tachypnea, accessory muscle usage or respiratory distress.  Breath sounds: No stridor. Wheezing, rhonchi and rales present.  Abdominal:     General: Bowel sounds are normal. There is no distension.     Palpations: Abdomen is soft.     Tenderness: There is no abdominal tenderness.  Musculoskeletal:         General: No tenderness.  Lymphadenopathy:     Cervical: No cervical adenopathy.  Skin:    General: Skin is warm and dry.     Capillary Refill: Capillary refill takes less than 2 seconds.     Findings: No rash.  Neurological:     Mental Status: He is alert and oriented to person, place, and time.  Psychiatric:        Behavior: Behavior normal.      Vitals:   01/27/19 0924  BP: 110/70  Pulse: 79  Temp: 97.9 F (36.6 C)  TempSrc: Oral  SpO2: 96%  Weight: 212 lb 3.2 oz (96.3 kg)  Height: 5\' 9"  (1.753 m)   96% on RA BMI Readings from Last 3 Encounters:  01/27/19 31.34 kg/m  10/27/18 33.18 kg/m  06/14/18 35.02 kg/m   Wt Readings from Last 3 Encounters:  01/27/19 212 lb 3.2 oz (96.3 kg)  10/27/18 215 lb (97.5 kg)  06/14/18 223 lb 9.6 oz (101.4 kg)    CBC    Component Value Date/Time   WBC 7.9 11/07/2014 1358   RBC 5.44 11/07/2014 1358   HGB 16.3 11/07/2014 1358   HCT 49.0 11/07/2014 1358   PLT 202 11/07/2014 1358   MCV 90.1 11/07/2014 1358   MCH 30.0 11/07/2014 1358   MCHC 33.3 11/07/2014 1358   RDW 12.8 11/07/2014 1358   LYMPHSABS 1.7 11/07/2014 1358   MONOABS 0.6 11/07/2014 1358   EOSABS 0.2 11/07/2014 1358   BASOSABS 0.0 11/07/2014 1358    Chest Imaging:  11/07/2014 IMPRESSION: No active cardiopulmonary disease.  02/18/2018: CT chest No acute cardiopulmonary disease, evidence of centrilobular emphysema The patient's images have been independently reviewed by me.    Pulmonary Functions Testing Results: 03/12/2018: Postbronchodilator responses recorded FVC 3.12 L 71% predicted FEV1 2.4 L 71% predicted, 20% bronchodilator response Ratio 77 SVC 69% TLC 68% RV/TLC ratio 125% ERV 16% DLCO 59%  PFT Results Latest Ref Rng & Units 03/12/2018  FVC-Pre L 2.68  FVC-Predicted Pre % 61  FVC-Post L 3.12  FVC-Predicted Post % 71  Pre FEV1/FVC % % 74  Post FEV1/FCV % % 77  FEV1-Pre L 1.99  FEV1-Predicted Pre % 59  FEV1-Post L 2.40  DLCO UNC% % 59   DLCO COR %Predicted % 79     FeNO: None   Pathology: None   Echocardiogram:   Study Conclusions - Left ventricle: Technically difficult study. The cavity size was   normal. Wall thickness was increased in a pattern of mild LVH.   The estimated ejection fraction was 60%. Wall motion was normal;   there were no regional wall motion abnormalities. - Right ventricle: The cavity size was normal. Systolic function   was normal. - Impressions: No cardiac source of embolism was identified, but   cannot be ruled out on the basis of this examination. Impressions: - No cardiac source of embolism was identified, but cannot be ruled   out on the basis of this examination.  Heart Catheterization: None     Assessment & Plan:     ICD-10-CM   1. Stage 2 moderate COPD by GOLD classification (HCC)  J44.9 Ambulatory Referral for Lung Cancer Scre  2. COPD  with acute exacerbation (HCC)  J44.1   3. Wheezing  R06.2   4. SOB (shortness of breath)  R06.02   5. Current smoker  F17.200     Discussion:  This is a 58 year old male, current smoker, PFTs with mixed restrictive and obstructive defect, 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  CT with evidence of centrilobular emphysema.  He is currently experiencing acute exacerbation with ongoing wheezing and chest tightness and sputum production. We will treat the patient with azithromycin as well as prednisone for the next 5 days.  Patient also needs to enroll in our lung cancer screening program.  He has a significant smoking history.  Last CT was in September 2019 with no nodules.  Patient was instructed on how to use inhalers today in the office. He does have literacy issues and this is very important for him to understand. We simplified his regimen and started him on Anoro Ellipta once a day.  We gave him samples of this medication today. Hopefully he will be able to afford this.  He had much difficulty using  the Spiriva Respimat. Continue albuterol for shortness of breath and wheezing as needed. Again smoking cessation counseling is very important.  Patient to return to clinic in 1 month for enrollment in our lung cancer screening program as well as checkup on symptoms.  Greater than 50% of this patient's 25-minute of visit was spent face-to-face discussing above recommendations and treatment plan.    Current Outpatient Medications:    albuterol (PROVENTIL HFA;VENTOLIN HFA) 108 (90 Base) MCG/ACT inhaler, Inhale 2 puffs into the lungs every 6 (six) hours as needed for wheezing or shortness of breath., Disp: 1 Inhaler, Rfl: 6   ALPRAZolam (XANAX) 1 MG tablet, Take 1 mg by mouth 3 (three) times daily as needed for anxiety. , Disp: , Rfl:    atorvastatin (LIPITOR) 20 MG tablet, Take 1 tablet (20 mg total) by mouth daily., Disp: 30 tablet, Rfl: 6   budesonide-formoterol (SYMBICORT) 80-4.5 MCG/ACT inhaler, Inhale 2 puffs into the lungs every 12 (twelve) hours., Disp: 1 Inhaler, Rfl: 5   clopidogrel (PLAVIX) 75 MG tablet, Take 1 tablet (75 mg total) by mouth daily., Disp: 30 tablet, Rfl: 0   gabapentin (NEURONTIN) 300 MG capsule, Take 300 mg by mouth 3 (three) times daily., Disp: , Rfl: 2   pantoprazole (PROTONIX) 40 MG tablet, Take 40 mg by mouth daily., Disp: , Rfl:    Spacer/Aero-Holding Chambers (AEROCHAMBER MINI CHAMBER) DEVI, 1 Device by Does not apply route as directed., Disp: 1 Device, Rfl: 0   Tiotropium Bromide-Olodaterol (STIOLTO RESPIMAT) 2.5-2.5 MCG/ACT AERS, Inhale 2 puffs into the lungs daily., Disp: 3 Inhaler, Rfl: 3   TRICOR 145 MG tablet, Take 1 tablet (145 mg total) by mouth daily., Disp: 30 tablet, Rfl: 6   Vitamin D, Ergocalciferol, (DRISDOL) 50000 UNITS CAPS capsule, TAKE ONE CAPSULE BY MOUTH WEEKLY, Disp: , Rfl:    Josephine IgoBradley L Star Cheese, DO [facility] 01/27/2019 9:52 AM

## 2019-02-22 ENCOUNTER — Encounter: Payer: Self-pay | Admitting: *Deleted

## 2019-03-03 ENCOUNTER — Telehealth: Payer: Self-pay | Admitting: Pulmonary Disease

## 2019-03-03 NOTE — Telephone Encounter (Signed)
ATC, NA 

## 2019-03-04 NOTE — Telephone Encounter (Signed)
Spoke with pt and scheduled SDMV through [facility] 03/28/19 5:35 CT will be ordered at that time Nothing further needed

## 2019-03-07 ENCOUNTER — Encounter: Payer: Self-pay | Admitting: Acute Care

## 2019-03-07 ENCOUNTER — Ambulatory Visit: Payer: Medicaid Other | Admitting: Acute Care

## 2019-03-07 ENCOUNTER — Other Ambulatory Visit: Payer: Self-pay

## 2019-03-07 DIAGNOSIS — F17210 Nicotine dependence, cigarettes, uncomplicated: Secondary | ICD-10-CM | POA: Diagnosis not present

## 2019-03-07 DIAGNOSIS — Z720 Tobacco use: Secondary | ICD-10-CM

## 2019-03-07 DIAGNOSIS — J449 Chronic obstructive pulmonary disease, unspecified: Secondary | ICD-10-CM | POA: Insufficient documentation

## 2019-03-07 DIAGNOSIS — J441 Chronic obstructive pulmonary disease with (acute) exacerbation: Secondary | ICD-10-CM | POA: Diagnosis not present

## 2019-03-07 MED ORDER — ANORO ELLIPTA 62.5-25 MCG/INH IN AEPB: 1.0000 | INHALATION_SPRAY | Freq: Every day | RESPIRATORY_TRACT | 0 refills | Status: DC

## 2019-03-07 MED ORDER — ANORO ELLIPTA 62.5-25 MCG/INH IN AEPB: 1.0000 | INHALATION_SPRAY | Freq: Every day | RESPIRATORY_TRACT | 3 refills | Status: DC

## 2019-03-07 MED ORDER — ALBUTEROL SULFATE HFA 108 (90 BASE) MCG/ACT IN AERS: 2.0000 | INHALATION_SPRAY | Freq: Four times a day (QID) | RESPIRATORY_TRACT | 2 refills | Status: DC | PRN

## 2019-03-07 NOTE — Patient Instructions (Addendum)
It is good to see you today. We will see if we have samples of Anoro for you today. Use this every day 1 puff in the morning. Rinse mouth after use Call us if you run out. We will send in the paperwork for your Anoro financial assistance.  We will send in a prescription for Ventolin.( [place]) This is your rescue inhaler. Use this as needed for shortness of breath or wheezing up to 3 times daily. If you need your rescue more than 2-3 times daily please call to be seen..  We will see you at the [facility] on 10/12 at 5:35 pm. Follow up in 3 months  With Dr. Valeta Harms of [facility] I understand that you do not want the flu vaccine today, but please think about this as you have underlying lung disease the flu shot can benefit you.  Continue to work on quitting smoking This is the single most effective action you can take to decrease your risk of lung cancer, lung disease,heart disease and stroke. We will re-refer you for a follow up at [facility] with cardiology Please contact office for sooner follow up if symptoms do not improve or worsen or seek emergency care   This was reviewed with patient orally prior to leaving the office.

## 2019-03-07 NOTE — Assessment & Plan Note (Signed)
  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  Plan We will see if we have samples of Anoro for you today. Use this every day 1 puff in the morning. Rinse mouth after use Call us if you run out. We will send in the paperwork for your Anoro financial assistance.  We will send in a prescription for Ventolin.( [place]) This is your rescue inhaler. Use this as needed for shortness of breath or wheezing up to 3 times daily. If you need your rescue more than 2-3 times daily please call to be seen..  We will see you at the [facility] on 10/12 at 5:35 pm. Follow up in 3 months  With Dr. Valeta Harms of Lilie Vezina NP I understand that you do not want the flu vaccine today, but please think about this as you have underlying lung disease the flu shot can benefit you.  Continue to work on quitting smoking This is the single most effective action you can take to decrease your risk of lung cancer, lung disease,heart disease and stroke. We will re-refer you for a follow up at [facility] with cardiology Please contact office for sooner follow up if symptoms do not improve or worsen or seek emergency care

## 2019-03-07 NOTE — Assessment & Plan Note (Signed)
Working on quitting Currently smoking 8 cigarettes  Daily I have spent 5 minutes counseling patient on smoking cessation this visit. Patient verbalizes understanding of their  choice to continue smoking and the negative health consequences including worsening of COPD, risk of lung cancer , stroke and heart disease..   I have offered assistance in smoking cessation. I have asked him to call the office if I can help him in any way.

## 2019-03-07 NOTE — Progress Notes (Signed)
Synopsis: Referred in August 2019 for wheezing/SOB by Joette Catching, MD  History of Present Illness Ian [name] is a 58 y.o. male with mixed restrictive and obstructive defect, 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He is followed by Dr. Tonia Brooms  Of note, patient cannot read or write>> needs verbal or visual clues    9/21/2020Pt. Presents for follow up. He states he ran out of his Anoro after his last visit and didn't call to let us know. He states the medication did help with his shortness of breath. We have filled out his paperwork for financial assistance through [employer]. We will provide him with Anoro Samples. He understands she needs to use this every day. He continues to complain of shortness of breath is with activity. He continues to smoke about 8 cigarettes a day. He has run out of his rescue inhaler.  We will prescribe this for him. He states he is at his baseline, but that he can tell a big difference when he is not using his Anoro.NO increase in secretions or change in color of secretions.  He is scheduled for the Lung Cancer Screening through our  grant program 03/28/2019.   He denies any fever, chest pain, orthopnea or hemoptysis. No recent unexplained weight loss.  Test Results: Chest Imaging:  11/07/2014 IMPRESSION: No active cardiopulmonary disease.  02/18/2018: CT chest No acute cardiopulmonary disease, evidence of centrilobular emphysema The patient's images have been independently reviewed by me.    Pulmonary Functions Testing Results: 03/12/2018: Postbronchodilator responses recorded FVC 3.12 L 71% predicted FEV1 2.4 L 71% predicted, 20% bronchodilator response Ratio 77 SVC 69% TLC 68% RV/TLC ratio 125% ERV 16% DLCO 59%  Echocardiogram:  10/2014 Study Conclusions - Left ventricle: Technically difficult study. The cavity size was normal. Wall thickness was increased in a pattern of mild LVH. The estimated ejection  fraction was 60%. Wall motion was normal; there were no regional wall motion abnormalities. - Right ventricle: The cavity size was normal. Systolic function was normal. - Impressions: No cardiac source of embolism was identified, but cannot be ruled out on the basis of this examination. Impressions: - No cardiac source of embolism was identified, but cannot be ruled out on the basis of this examination.   CBC Latest Ref Rng & Units 11/07/2014 05/24/2013 02/11/2013  WBC 4.0 - 10.5 K/uL 7.9 - 7.2  Hemoglobin 13.0 - 17.0 g/dL 91.6 38.4 66.5  Hematocrit 39.0 - 52.0 % 49.0 49.0 45.4  Platelets 150 - 400 K/uL 202 - 225    BMP Latest Ref Rng & Units 11/07/2014 05/24/2013 02/11/2013  Glucose 65 - 99 mg/dL 96 92 993(T)  BUN 6 - 20 mg/dL 12 15 13   Creatinine 0.61 - 1.24 mg/dL 7.01 7.79 3.90  Sodium 135 - 145 mmol/L 137 138 137  Potassium 3.5 - 5.1 mmol/L 4.2 4.0 3.9  Chloride 101 - 111 mmol/L 105 104 102  CO2 22 - 32 mmol/L 27 - 26  Calcium 8.9 - 10.3 mg/dL 3.0(S) - 9.0    BNP No results found for: BNP  ProBNP No results found for: PROBNP  PFT    Component Value Date/Time   FEV1PRE 1.99 03/12/2018 1503   FEV1POST 2.40 03/12/2018 1503   FVCPRE 2.68 03/12/2018 1503   FVCPOST 3.12 03/12/2018 1503   DLCOUNC 16.81 03/12/2018 1503   PREFEV1FVCRT 74 03/12/2018 1503   PSTFEV1FVCRT 77 03/12/2018 1503    No results found.   Past medical hx Past Medical History:  Diagnosis  Date  . Hx of cardiovascular stress test    Lexiscan Myoview (09/2013):  No ischemia, EF 62%; NORMAL  . Leg pain    ABIs (09/29/13):  Normal.  . Stomach ulcer   . Stroke (HCC)   . Tobacco abuse      Social History   Tobacco Use  . Smoking status: Current Every Day Smoker    Packs/day: 1.00    Years: 35.00    Pack years: 35.00    Types: Cigarettes  . Smokeless tobacco: Never Used  . Tobacco comment: smokes 1 ppd 8.21.19  Substance Use Topics  . Alcohol use: Not Currently    Alcohol/week: 9.0 -  13.0 standard drinks    Types: 1 Glasses of wine, 8 - 12 Cans of beer per week    Comment: Drank beer, for 35 years, quit 5 years ago   . Drug use: No    [name] reports that he has been smoking cigarettes. He has a 35.00 pack-year smoking history. He has never used smokeless tobacco. He reports previous alcohol use of about 9.0 - 13.0 standard drinks of alcohol per week. He reports that he does not use drugs.  Tobacco Cessation: Current Every day smoker with a 35 pack year smoking history  I have spent 3 minutes counseling patient on smoking cessation this visit. Patient verbalizes understanding of their  choice to continue smoking and the negative health consequences including worsening of COPD, risk of lung cancer , stroke and heart disease.Marland Kitchen    Past surgical hx, Family hx, Social hx all reviewed.  Current Outpatient Medications on File Prior to Visit  Medication Sig  . ALPRAZolam (XANAX) 1 MG tablet Take 1 mg by mouth 3 (three) times daily as needed for anxiety.   Marland Kitchen atorvastatin (LIPITOR) 20 MG tablet Take 1 tablet (20 mg total) by mouth daily.  Marland Kitchen azithromycin (ZITHROMAX) 250 MG tablet Take 1 tablet (250 mg total) by mouth daily. Take two first day and one day following  . clopidogrel (PLAVIX) 75 MG tablet Take 1 tablet (75 mg total) by mouth daily.  Marland Kitchen gabapentin (NEURONTIN) 300 MG capsule Take 300 mg by mouth 3 (three) times daily.  . pantoprazole (PROTONIX) 40 MG tablet Take 40 mg by mouth daily.  Marland Kitchen Spacer/Aero-Holding Chambers (AEROCHAMBER MINI CHAMBER) DEVI 1 Device by Does not apply route as directed.  . TRICOR 145 MG tablet Take 1 tablet (145 mg total) by mouth daily.  . Vitamin D, Ergocalciferol, (DRISDOL) 50000 UNITS CAPS capsule TAKE ONE CAPSULE BY MOUTH WEEKLY   No current facility-administered medications on file prior to visit.      Allergies  Allergen Reactions  . Asa [Aspirin] Other (See Comments)  . Ibuprofen Other (See Comments)  . Tylenol [Acetaminophen] Other  (See Comments)    Review Of Systems:  Constitutional:   No  weight loss, night sweats,  Fevers, chills, fatigue, or  lassitude.  HEENT:   No headaches,  Difficulty swallowing,  Tooth/dental problems, or  Sore throat,                No sneezing, itching, ear ache, nasal congestion, post nasal drip,   CV:  No chest pain,  Orthopnea, PND, swelling in lower extremities, anasarca, dizziness, palpitations, syncope.   GI  No heartburn, indigestion, abdominal pain, nausea, vomiting, diarrhea, change in bowel habits, loss of appetite, bloody stools.   Resp: + shortness of breath with exertion less at rest.  No excess mucus, no productive cough,  No  non-productive cough,  No coughing up of blood.  No change in color of mucus.  No wheezing.  No chest wall deformity  Skin: no rash or lesions.  GU: no dysuria, change in color of urine, no urgency or frequency.  No flank pain, no hematuria   MS:  No joint pain or swelling.  No decreased range of motion.  No back pain.  Psych:  No change in mood or affect. No depression or anxiety.  No memory loss.   Vital Signs BP 108/62 (BP Location: Left Arm, Cuff Size: Normal)   Pulse 73   Temp 97.7 F (36.5 C) (Oral)   Ht 5\' 8"  (1.727 m)   Wt 216 lb (98 kg)   SpO2 96%   BMI 32.84 kg/m    Physical Exam:  General- No distress,  A&Ox3, pleasant ENT: No sinus tenderness, TM clear, pale nasal mucosa, no oral exudate,no post nasal drip, no LAN Cardiac: S1, S2, regular rate and rhythm, no murmur Chest: No wheeze/ rales/ dullness; no accessory muscle use, no nasal flaring, no sternal retractions Abd.: Soft Non-tender, ND, BS + Ext: No clubbing cyanosis, edema Neuro:  normal strength, MAE x 4, A&O x 3 Skin: No rashes, No lesions, warm and dry Psych: normal mood and behavior   Assessment/Plan  COPD with acute exacerbation (HCC)  Mixed restrictive and obstructive defect, 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   Plan We will see if we have samples of Anoro for you today. Use this every day 1 puff in the morning. Rinse mouth after use Call us if you run out. We will send in the paperwork for your Anoro financial assistance.  We will send in a prescription for Ventolin.( [place]) This is your rescue inhaler. Use this as needed for shortness of breath or wheezing up to 3 times daily. If you need your rescue more than 2-3 times daily please call to be seen..  We will see you at the [facility] on 10/12 at 5:35 pm. Follow up in 3 months  With Dr. Valeta Harms of Hildred Pharo NP I understand that you do not want the flu vaccine today, but please think about this as you have underlying lung disease the flu shot can benefit you.  Continue to work on quitting smoking This is the single most effective action you can take to decrease your risk of lung cancer, lung disease,heart disease and stroke. We will re-refer you for a follow up at [facility] with cardiology Please contact office for sooner follow up if symptoms do not improve or worsen or seek emergency care    Tobacco abuse Working on quitting Currently smoking 8 cigarettes  Daily I have spent 5 minutes counseling patient on smoking cessation this visit. Patient verbalizes understanding of their  choice to continue smoking and the negative health consequences including worsening of COPD, risk of lung cancer , stroke and heart disease..   I have offered assistance in smoking cessation. I have asked him to call the office if I can help him in any way.    Magdalen Spatz, NP 03/07/2019  3:08 PM

## 2019-03-13 NOTE — Progress Notes (Signed)
PCCM: thanks for seeing him  Garner Nash, DO  Pulmonary Critical Care 03/13/2019 1:26 PM

## 2019-03-16 ENCOUNTER — Telehealth: Payer: Self-pay | Admitting: Pharmacy Technician

## 2019-03-16 ENCOUNTER — Telehealth: Payer: Self-pay | Admitting: Acute Care

## 2019-03-16 NOTE — Telephone Encounter (Signed)
Pharmacy team received patient assistance application for Anoro Ellipta. Patient currently has active [employer]. Plan requires a prior authorization for Anoro Ellipta as it is considered Non-Preferred on his plan.   Per Medicaid guidelines, the pt has to try and fail 2 preferred medications:  Atrovent HFA, Duoneb solution, Atrovent nebulizer solution, Bevespi, Combivent Respimat, Spiriva Handihaler and Stiolto.  If approved, patient's copay would be $3.00  Jonelle Sidle, Please call  Patient and remind him to let us know if the current inhaler therapy we have him on is not effective. We can move through the try and fail list if needed, and then we can do a prior auth for Anoro if we have been through the list with no success. Thanks

## 2019-03-16 NOTE — Telephone Encounter (Signed)
Thanks so much for your help. No further assistance is needed at this time.

## 2019-03-16 NOTE — Telephone Encounter (Signed)
Pharmacy team received patient assistance application for Anoro Ellipta. Patient currently has active [employer]. Plan requires a prior authorization for Anoro Ellipta as it is considered Non-Preferred on his plan.   Per Medicaid guidelines, the pt has to try and fail 2 preferred medications:  Atrovent HFA, Duoneb solution, Atrovent nebulizer solution, Bevespi, Combivent Respimat, Spiriva Handihaler and Stiolto.  If approved, patient's copay would be $3.00  Please advise if any other assistance is needed from the pharmacy team

## 2019-03-23 NOTE — Telephone Encounter (Signed)
Spoke with pt, he states he does think the Anoro is helping but I advised him that we would have to change his inhaler since his insurance doesn't want to pay for Anoro. He agreed to change medications. Sarah which medication do you want him to try? I can see if we have a sample he can pick up to see if it helps before he buys it. Please advise.

## 2019-03-28 ENCOUNTER — Other Ambulatory Visit: Payer: Medicaid Other | Admitting: Acute Care

## 2019-03-28 NOTE — Progress Notes (Signed)
Shared Decision-Making Visit for Lung Cancer Screening [phone] )  Lung Cancer Screening Criteria 50-20-years of age 58+ pack year smoking history ( 78 pack year smoking history) No Recent History of coughing up blood   No Unexplained weight loss of > 15 pounds in the last 6 months. No Prior History Lung / other cancer  (Diagnosis within the last 5 years already requiring surveillance chest CT Scans). Pt is a current smoker, or former smoker who has quit within the last 15 years.  This patient meets the criterial noted above and is asymptomatic for any signs or symptoms of lung cancer.  The Shared Decision-Making Visit discussion included risks and benefits of screening, potential for follow up diagnostic testing for abnormal scans, potential for false positive tests, over diagnosis, and discussion about total radiation exposure. Patient stated willingness to undergo diagnostics and treatment as needed. Current smokers were counseled on smoking cessation as the single most powerful action they can take to decrease their risk of lung cancer, pulmonary disease, heart disease and stroke. They were given a resource card with information on receiving free nicotine replacement therapy , and information about free smoking cessation classes.  Pt understands that this scan is being paid for by a grant obtained by the [employer] [facility]. We discussed  that there is no guarantee of additional grant money from year to year as these grants are not guaranteed to programs on an annual basis.They have to be applied for and they are awarded based on county need, and utilization of the previous years funds.Shaune Leeks # Ticket to ride the scanner provided Be stronger than your excuses card provided.  Magdalen Spatz, MSN, AGACNP-BC [place] Pager # [phone] After 4 pm please call [phone] 03/28/2019

## 2019-04-04 MED ORDER — BEVESPI AEROSPHERE 9-4.8 MCG/ACT IN AERO: 2.0000 | INHALATION_SPRAY | Freq: Two times a day (BID) | RESPIRATORY_TRACT | 5 refills | Status: DC

## 2019-04-04 NOTE — Telephone Encounter (Signed)
Bevespi 2 puffs in the morning, and 2 puffs in the evening. Rinse mouth after use Thanks

## 2019-04-04 NOTE — Telephone Encounter (Signed)
Spoke with pt . He is almost out of Anoro sample from 03/16/19.  Pt would like to know what he needs to do regarding this inhaler.  See other telephone note:  Jannette Spanner, CMA     4:14 PM Note   Spoke with pt, he states he does think the Anoro is helping but I advised him that we would have to change his inhaler since his insurance doesn't want to pay for Anoro. He agreed to change medications. Sarah which medication do you want him to try? I can see if we have a sample he can pick up to see if it helps before he buys it. Please advise.

## 2019-04-04 NOTE — Telephone Encounter (Signed)
Called spoke with patient and discussed the change from Anoro to Bevespi 2puffs BID, rinse after use.  Patient lives in [place] and is unable to come to pick up samples - is okay with Rx to pharmacy.  Patient is also aware to ask his pharmacist on how to use the Bevespi if he feels he needs more instruction than was given during our conversation.  Pt voiced his understanding.  Rx sent to verified pharmacy.  Nothing further needed at this time; will sign off.

## 2019-06-23 ENCOUNTER — Telehealth: Payer: Self-pay | Admitting: Acute Care

## 2019-06-23 NOTE — Telephone Encounter (Signed)
Spoke with pt, I advised him that he didn't have an appt here with [place]. I tried to help him the best of my ability but I couldn't produce any information from our system. Pt states he will try to find the number they called from. Nothing further is needed.

## 2019-06-27 ENCOUNTER — Encounter: Payer: Self-pay | Admitting: Acute Care

## 2019-06-27 ENCOUNTER — Other Ambulatory Visit: Payer: Self-pay

## 2019-06-27 ENCOUNTER — Other Ambulatory Visit: Payer: Self-pay | Admitting: *Deleted

## 2019-06-27 ENCOUNTER — Other Ambulatory Visit: Payer: Medicaid Other | Admitting: Acute Care

## 2019-06-27 VITALS — Temp 98.0°F

## 2019-06-27 DIAGNOSIS — F17210 Nicotine dependence, cigarettes, uncomplicated: Secondary | ICD-10-CM

## 2019-06-27 NOTE — Progress Notes (Signed)
Shared Decision-Making Visit for Lung Cancer Screening [phone] )  Lung Cancer Screening Criteria 26-61-years of age 59+ pack year smoking history>> Current every day smoker 78 pack year smoking history No Recent History of coughing up blood   No Unexplained weight loss of > 15 pounds in the last 6 months. No Prior History Lung / other cancer  (Diagnosis within the last 5 years already requiring surveillance chest CT Scans). Pt is a current smoker, or former smoker who has quit within the last 15 years.  This patient meets the criterial noted above and is asymptomatic for any signs or symptoms of lung cancer.  The Shared Decision-Making Visit discussion included risks and benefits of screening, potential for follow up diagnostic testing for abnormal scans, potential for false positive tests, over diagnosis, and discussion about total radiation exposure. Patient stated willingness to undergo diagnostics and treatment as needed. Current smokers were counseled on smoking cessation as the single most powerful action they can take to decrease their risk of lung cancer, pulmonary disease, heart disease and stroke. They were given a resource card with information on receiving free nicotine replacement therapy , and information about free smoking cessation classes.  Pt understands that this scan is being paid for by a grant obtained by the [employer]. We discussed  that there is no guarantee of additional grant money from year to year as these grants are not guaranteed to programs on an annual basis.They have to be applied for and they are awarded based on county need, and utilization of the previous years funds.  Pt states he is cutting down on his smoking. He has the 1-800-quit now number to call for free nicotine replacement therapy  He understands that we will call him with his results  Ticket # 2  Bevelyn Ngo, MSN, AGACNP-BC [facility] Pulmonary/Critical Care Medicine Pager #  [phone] After 4 pm please call [phone]. 06/27/2019 5:46 PM

## 2019-07-06 ENCOUNTER — Other Ambulatory Visit: Payer: Self-pay

## 2019-07-06 ENCOUNTER — Ambulatory Visit
Admission: RE | Admit: 2019-07-06 | Discharge: 2019-07-06 | Disposition: A | Discharge status: Home / Self Care | Payer: No Typology Code available for payment source | Source: Ambulatory Visit | Attending: Acute Care | Admitting: Acute Care

## 2019-07-06 DIAGNOSIS — F17210 Nicotine dependence, cigarettes, uncomplicated: Secondary | ICD-10-CM

## 2019-07-07 NOTE — Progress Notes (Signed)
Please call patient and let them  know their  low dose Ct was read as a Lung RADS 1, negative study: no nodules or definitely benign nodules. Radiology recommendation is for a repeat LDCT in 12 months. .Please let them  know we will order and schedule their  annual screening scan for 06/2020. Please let them  know there was notation of CAD on their  scan.  Please remind the patient  that this is a non-gated exam therefore degree or severity of disease  cannot be determined. Please have them  follow up with their PCP regarding potential risk factor modification, dietary therapy or pharmacologic therapy if clinically indicated. Pt.  is  currently on statin therapy. Please place order for annual  screening scan for  06/2020 and fax results to PCP. Thanks so much. 

## 2019-07-13 NOTE — Progress Notes (Signed)
Please call patient and let them  know their  low dose Ct was read as a Lung RADS 1, negative study: no nodules or definitely benign nodules. Radiology recommendation is for a repeat LDCT in 12 months. .Please let them  know we will order and schedule their  annual screening scan for 06/2020. Please let them  know there was notation of CAD on their  scan.  Please remind the patient  that this is a non-gated exam therefore degree or severity of disease  cannot be determined. Please have them  follow up with their PCP regarding potential risk factor modification, dietary therapy or pharmacologic therapy if clinically indicated. Pt.  is  currently on statin therapy. Please place order for annual  screening scan for  06/2020 and fax results to PCP. Thanks so much. 

## 2019-08-03 ENCOUNTER — Ambulatory Visit (INDEPENDENT_AMBULATORY_CARE_PROVIDER_SITE_OTHER): Payer: Medicaid Other | Admitting: Orthopaedic Surgery

## 2019-08-03 ENCOUNTER — Other Ambulatory Visit: Payer: Self-pay

## 2019-08-03 ENCOUNTER — Encounter: Payer: Self-pay | Admitting: Orthopaedic Surgery

## 2019-08-03 DIAGNOSIS — M25512 Pain in left shoulder: Secondary | ICD-10-CM | POA: Diagnosis not present

## 2019-08-03 MED ORDER — METHYLPREDNISOLONE ACETATE 40 MG/ML IJ SUSP
40.0000 mg | INTRAMUSCULAR | Status: AC | PRN
  Administered 2019-08-03: 09:00:00 40 mg via INTRA_ARTICULAR

## 2019-08-03 MED ORDER — LIDOCAINE HCL 1 % IJ SOLN
0.5000 mL | INTRAMUSCULAR | Status: AC | PRN
  Administered 2019-08-03: .5 mL

## 2019-08-03 MED ORDER — LIDOCAINE HCL 1 % IJ SOLN
3.0000 mL | INTRAMUSCULAR | Status: AC | PRN
  Administered 2019-08-03: 3 mL

## 2019-08-03 NOTE — Progress Notes (Signed)
Office Visit Note   Patient: Ian [name]           Date of Birth: 1960/08/08           MRN: [id number] Visit Date: 08/03/2019              Requested by: Jettie Booze, NP [place],  [place] [address] PCP: Jettie Booze, NP   Assessment & Plan: Visit Diagnoses:  1. Acute pain of left shoulder     Plan: We will have him undergo a left shoulder MRI to rule out rotator cuff tear.  Have him follow-up after the MRI to go over the results and discuss further treatment.  Questions encouraged and answered by Dr. Ninfa Linden myself.  Follow-Up Instructions: Return After MRI.   Orders:  Orders Placed This Encounter  Procedures  . Large Joint Inj   No orders of the defined types were placed in this encounter.     Procedures: Large Joint Inj on 08/03/2019 9:19 AM Indications: pain Details: 22 G 1.5 in needle, superior approach  Arthrogram: No  Medications: 3 mL lidocaine 1 %; 40 mg methylPREDNISolone acetate 40 MG/ML; 0.5 mL lidocaine 1 % Outcome: tolerated well, no immediate complications Procedure, treatment alternatives, risks and benefits explained, specific risks discussed. Consent was given by the patient. Immediately prior to procedure a time out was called to verify the correct patient, procedure, equipment, support staff and site/side marked as required. Patient was prepped and draped in the usual sterile fashion.       Clinical Data: No additional findings.   Subjective: Chief Complaint  Patient presents with  . Left Shoulder - Pain    HPI Ian [name] is well-known to Dr. Ninfa Linden service comes in today with left shoulder pain that has been ongoing for the past 2 to 3 weeks.  No known injury.  Is having problems with raising his arm above his head.  He was last seen 10/26/2018 was given the left shoulder injection and did well until recently.  He denies any radicular symptoms down the left arm.  Pain is mostly over the [facility] joint area and in the  trapezius region of the left shoulder.  Review of Systems Negative for fevers or chills.  Please see HPI  Objective: Vital Signs: There were no vitals taken for this visit.  Physical Exam Pulmonary:     Effort: Pulmonary effort is normal.  Neurological:     Mental Status: He is alert and oriented to person, place, and time.  Psychiatric:        Mood and Affect: Mood normal.     Ortho Exam Bilateral shoulders he has 5 out of 5 strength with internal rotation against resistance.  Left shoulder 4 out of 5 strength with external rotation against resistance.  Positive empty can test on the left only.  Positive impingement on the left only.  Positive liftoff test on the left.  Tenderness over the left AC joint region. Specialty Comments:  No specialty comments available.  Imaging: No results found.   PMFS History: Patient Active Problem List   Diagnosis Date Noted  . COPD with acute exacerbation ([place]) 03/07/2019  . Shortness of breath 02/03/2018  . DOE (dyspnea on exertion) 02/03/2018  . Chest pain on exertion 02/03/2018  . Family history of early CAD 02/03/2018  . Current smoker 02/03/2018  . History of alcohol use 02/03/2018  . Chronic left shoulder pain 02/17/2017  . Closed nondisplaced fracture  of head of radius with routine healing 10/17/2016  . Pain in right elbow 10/17/2016  . 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 HLD (hyperlipidemia) 03/06/2015  . Tobacco use disorder 03/06/2015  . Palpitations 03/05/2015  . 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 Tobacco abuse 11/07/2014  . Chest pain 08/20/2013  . No significant medical problems    Past Medical History:  Diagnosis Date  . Hx of cardiovascular stress test    Lexiscan Myoview (09/2013):  No ischemia, EF 62%; NORMAL  . Leg pain    ABIs (09/29/13):  Normal.  . Stomach ulcer   . Stroke (HCC)   . Tobacco abuse     Family History  Problem Relation Age of Onset  . CAD Father 69       Died of MI  .  Heart failure Mother 31       Alive  . Diabetes Mother   . Diabetes Sister   . CAD Paternal Grandfather 50       Died of MI  . Heart disease Daughter        Rapid heart rate  . CAD Brother   . Cancer Maternal Grandmother        Throat cancer     Past Surgical History:  Procedure Laterality Date  . ANKLE FRACTURE SURGERY    . APPENDECTOMY     Social History   Occupational History    Employer: NOT EMPLOYED  Tobacco Use  . Smoking status: Current Every Day Smoker    Packs/day: 2.25    Years: 35.00    Pack years: 78.75    Types: Cigarettes  . Smokeless tobacco: Never Used  . Tobacco comment: smokes 1 ppd 8.21.19  Substance and Sexual Activity  . Alcohol use: Not Currently    Alcohol/week: 9.0 - 13.0 standard drinks    Types: 1 Glasses of wine, 8 - 12 Cans of beer per week    Comment: Drank beer, for 35 years, quit 5 years ago   . Drug use: No  . Sexual activity: Not on file

## 2019-08-03 NOTE — Addendum Note (Signed)
Addended by: Mardene Celeste B on: 08/03/2019 10:11 AM   Modules accepted: Orders

## 2019-09-02 ENCOUNTER — Ambulatory Visit (HOSPITAL_COMMUNITY)
Admission: RE | Admit: 2019-09-02 | Discharge: 2019-09-02 | Disposition: A | Discharge status: Home / Self Care | Payer: Medicaid Other | Source: Ambulatory Visit | Attending: Orthopaedic Surgery | Admitting: Orthopaedic Surgery

## 2019-09-02 ENCOUNTER — Other Ambulatory Visit: Payer: Self-pay

## 2019-09-02 DIAGNOSIS — M25512 Pain in left shoulder: Secondary | ICD-10-CM | POA: Diagnosis not present

## 2019-09-06 ENCOUNTER — Encounter: Payer: Self-pay | Admitting: Orthopaedic Surgery

## 2019-09-06 ENCOUNTER — Other Ambulatory Visit: Payer: Self-pay

## 2019-09-06 ENCOUNTER — Ambulatory Visit (INDEPENDENT_AMBULATORY_CARE_PROVIDER_SITE_OTHER): Payer: Medicaid Other | Admitting: Orthopaedic Surgery

## 2019-09-06 DIAGNOSIS — M25512 Pain in left shoulder: Secondary | ICD-10-CM

## 2019-09-06 DIAGNOSIS — G8929 Other chronic pain: Secondary | ICD-10-CM | POA: Diagnosis not present

## 2019-09-06 MED ORDER — NABUMETONE 750 MG PO TABS: 750.0000 mg | ORAL_TABLET | Freq: Two times a day (BID) | ORAL | 1 refills | Status: DC | PRN

## 2019-09-06 MED ORDER — HYDROCODONE-ACETAMINOPHEN 7.5-325 MG PO TABS: 1.0000 | ORAL_TABLET | Freq: Three times a day (TID) | ORAL | 0 refills | Status: DC | PRN

## 2019-09-06 NOTE — Progress Notes (Signed)
The patient comes in today to go over MRI of his left shoulder.  Since his last visit his shoulder has improved somewhat with a steroid injection.  The MRI does show tendinosis of the rotator cuff.  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  This is a superior aspect of the labrum.  There is also some tendinosis of the biceps tendon.  The rotator cuff itself is intact.  There is no significant glenohumeral changes.  On exam he can abduct his shoulder much better.  His right shoulder is now more painful when he abducts it.  He does show signs of impingement on exam today.  Also both his knees are hurting and his right ankle is hurting.  We have provided a steroid injection in the past in his right shoulder.  We have also performed surgery on the right ankle remotely.  He is really just requesting something for pain and inflammation as opposed any type of surgery right now.  I told him I can send him in just a short course of hydrocodone but also try Relafen and a steroid taper.  All question concerns were answered addressed.  We can see him back in 4 weeks to see how he is responding to this.  I did go over in detail what the MRI means for his left shoulder.  Since things seem to have improved slightly with the left shoulder were going to hold off on any surgical recommendations for now.

## 2019-10-04 ENCOUNTER — Ambulatory Visit (INDEPENDENT_AMBULATORY_CARE_PROVIDER_SITE_OTHER): Payer: Medicaid Other | Admitting: Orthopaedic Surgery

## 2019-10-04 ENCOUNTER — Other Ambulatory Visit: Payer: Self-pay

## 2019-10-04 ENCOUNTER — Encounter: Payer: Self-pay | Admitting: Orthopaedic Surgery

## 2019-10-04 DIAGNOSIS — M25512 Pain in left shoulder: Secondary | ICD-10-CM

## 2019-10-04 DIAGNOSIS — G8929 Other chronic pain: Secondary | ICD-10-CM | POA: Diagnosis not present

## 2019-10-04 NOTE — Progress Notes (Signed)
The patient comes today with improving symptoms overall in terms of his left shoulder pain.  He is able to move his arm above his head now and states that the injection and anti-inflammatories have worked greatly.  He does have a known superior labral tear and moderate arthritis of the [facility] joint.  On clinical exam I can put his left shoulder through much improved range of motion overall.  There is no weakness.  His pain is minimal.  At this point since he is doing so well follow-up can be as needed.  If things flareup for him at all he will let us know.

## 2019-12-08 DIAGNOSIS — Z7189 Other specified counseling: Secondary | ICD-10-CM | POA: Insufficient documentation

## 2019-12-08 DIAGNOSIS — I10 Essential (primary) hypertension: Secondary | ICD-10-CM | POA: Insufficient documentation

## 2019-12-08 DIAGNOSIS — I700 Atherosclerosis of aorta: Secondary | ICD-10-CM | POA: Insufficient documentation
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
## 2019-12-09 ENCOUNTER — Other Ambulatory Visit: Payer: Self-pay

## 2019-12-09 ENCOUNTER — Encounter: Payer: Self-pay | Admitting: Cardiology

## 2019-12-09 ENCOUNTER — Ambulatory Visit (INDEPENDENT_AMBULATORY_CARE_PROVIDER_SITE_OTHER): Payer: Medicaid Other | Admitting: Cardiology

## 2019-12-09 VITALS — BP 130/70 | HR 60 | Temp 97.3°F | Ht 69.0 in | Wt 226.0 lb

## 2019-12-09 DIAGNOSIS — I10 Essential (primary) hypertension: Secondary | ICD-10-CM

## 2019-12-09 DIAGNOSIS — R0602 Shortness of breath: Secondary | ICD-10-CM

## 2019-12-09 DIAGNOSIS — Z720 Tobacco use: Secondary | ICD-10-CM

## 2019-12-09 DIAGNOSIS — Z7189 Other specified counseling: Secondary | ICD-10-CM

## 2019-12-09 DIAGNOSIS — R072 Precordial pain: Secondary | ICD-10-CM | POA: Diagnosis not present

## 2019-12-09 DIAGNOSIS — I700 Atherosclerosis of aorta: Secondary | ICD-10-CM | POA: Diagnosis not present

## 2019-12-09 NOTE — Patient Instructions (Addendum)
Medication Instructions:  Your physician recommends that you continue on your current medications as directed. Please refer to the Current Medication list given to you today.  *If you need a refill on your cardiac medications before your next appointment, please call your pharmacy*  Lab Work: LP/BMET 1 WEEK PRIOR TO CT   If you have labs (blood work) drawn today and your tests are completely normal, you will receive your results only by: Marland Kitchen MyChart Message (if you have MyChart) OR . A paper copy in the mail If you have any lab test that is abnormal or we need to change your treatment, we will call you to review the results.  Testing/Procedures: Your physician has requested that you have cardiac CT. Cardiac computed tomography (CT) is a painless test that uses an x-ray machine to take clear, detailed pictures of your heart. For further information please visit [email or web address].[email or web address]. Please follow instruction sheet as given. THE OFFICE WILL CALL YOU TO SCHEDULE WHEN YOUR INSURANCE HAS APPROVED   Follow-Up: At [facility], you and your health needs are our priority.  As part of our continuing mission to provide you with exceptional heart care, we have created designated Provider Care Teams.  These Care Teams include your primary Cardiologist (physician) and Advanced Practice Providers (APPs -  Physician Assistants and Nurse Practitioners) who all work together to provide you with the care you need, when you need it.  We recommend signing up for the patient portal called "MyChart".  Sign up information is provided on this After Visit Summary.  MyChart is used to connect with patients for Virtual Visits (Telemedicine).  Patients are able to view lab/test results, encounter notes, upcoming appointments, etc.  Non-urgent messages can be sent to your provider as well.   To learn more about what you can do with MyChart, go to [email or web address].    Your next appointment:   3 month(s)  The  format for your next appointment:   In Person [place]   Provider:   DR [facility]    Other Instructions  Your cardiac CT will be scheduled at one of the below locations:   [facility] [address] [place], [place] [address] [phone]  [place] [address] [place], [place] [address] [phone]  If scheduled at [facility], please arrive at the [facility] main entrance of [facility] 30 minutes prior to test start time. Proceed to the [facility] Radiology Department (first floor) to check-in and test prep.  If scheduled at [facility], please arrive 15 mins early for check-in and test prep.  Please follow these instructions carefully (unless otherwise directed):  Hold all erectile dysfunction medications at least 3 days (72 hrs) prior to test.  On the Night Before the Test: . Be sure to Drink plenty of water. . Do not consume any caffeinated/decaffeinated beverages or chocolate 12 hours prior to your test. . Do not take any antihistamines 12 hours prior to your test. . If the patient has contrast allergy: ? Patient will need a prescription for Prednisone and very clear instructions (as follows): 1. Prednisone 50 mg - take 13 hours prior to test 2. Take another Prednisone 50 mg 7 hours prior to test 3. Take another Prednisone 50 mg 1 hour prior to test 4. Take Benadryl 50 mg 1 hour prior to test . Patient must complete all four doses of above prophylactic medications. . Patient will need  a ride after test due to Benadryl.  On the Day of the Test: . Drink plenty of water. Do not drink any water within one hour of the test. . Do not eat any food 4 hours prior to the test. . You may take your regular medications prior to the test.  . Take metoprolol (Lopressor) two hours prior to test. . HOLD Furosemide/Hydrochlorothiazide morning of the test. . FEMALES-  please wear underwire-free bra if available  After the Test: . Drink plenty of water. . After receiving IV contrast, you may experience a mild flushed feeling. This is normal. . On occasion, you may experience a mild rash up to 24 hours after the test. This is not dangerous. If this occurs, you can take Benadryl 25 mg and increase your fluid intake. . If you experience trouble breathing, this can be serious. If it is severe call 911 IMMEDIATELY. If it is mild, please call our office. . If you take any of these medications: Glipizide/Metformin, Avandament, Glucavance, please do not take 48 hours after completing test unless otherwise instructed.   Once we have confirmed authorization from your insurance company, we will call you to set up a date and time for your test.   For non-scheduling related questions, please contact the cardiac imaging nurse navigator should you have any questions/concerns: Marchia Bond, Cardiac Imaging Nurse Navigator Burley Saver, Interim Cardiac Imaging Nurse [facility] Direct Office Dial: [phone]   For scheduling needs, including cancellations and rescheduling, please call [phone].

## 2019-12-14 ENCOUNTER — Telehealth: Payer: Self-pay | Admitting: *Deleted

## 2019-12-14 DIAGNOSIS — E785 Hyperlipidemia, unspecified: Secondary | ICD-10-CM

## 2019-12-14 DIAGNOSIS — Z5181 Encounter for therapeutic drug level monitoring: Secondary | ICD-10-CM

## 2019-12-14 DIAGNOSIS — I10 Essential (primary) hypertension: Secondary | ICD-10-CM

## 2019-12-14 LAB — BASIC METABOLIC PANEL
BUN/Creatinine Ratio: 12 (ref 9–20)
BUN: 11 mg/dL (ref 6–24)
CO2: 23 mmol/L (ref 20–29)
Calcium: 9.7 mg/dL (ref 8.7–10.2)
Chloride: 103 mmol/L (ref 96–106)
Creatinine, Ser: 0.95 mg/dL (ref 0.76–1.27)
GFR calc Af Amer: 102 mL/min/{1.73_m2} (ref >59)
GFR calc non Af Amer: 88 mL/min/{1.73_m2} (ref >59)
Glucose: 112 mg/dL — ABNORMAL HIGH (ref 65–99)
Potassium: 4.3 mmol/L (ref 3.5–5.2)
Sodium: 141 mmol/L (ref 134–144)

## 2019-12-14 LAB — LIPID PANEL
Chol/HDL Ratio: 4.6 ratio (ref 0.0–5.0)
Cholesterol, Total: 160 mg/dL (ref 100–199)
HDL: 35 mg/dL — ABNORMAL LOW (ref >39)
LDL Chol Calc (NIH): 104 mg/dL — ABNORMAL HIGH (ref 0–99)
Triglycerides: 116 mg/dL (ref 0–149)
VLDL Cholesterol Cal: 21 mg/dL (ref 5–40)

## 2019-12-14 MED ORDER — ATORVASTATIN CALCIUM 40 MG PO TABS: 40.0000 mg | ORAL_TABLET | Freq: Every day | ORAL | 1 refills | Status: DC

## 2019-12-14 NOTE — Telephone Encounter (Signed)
-----   Message from Rollene Rotunda, MD sent at 12/14/2019 10:46 AM EDT ----- LDL is above target.  I would like to increase the Lipitor to 40 mg daily and repeat a lipid profile and liver enzymes in 10 weeks.  Call Mr. Ozga with the results and send results to April Manson, NP

## 2019-12-14 NOTE — Telephone Encounter (Signed)
Advised patient of lab results, mailed lab results

## 2019-12-28 ENCOUNTER — Telehealth (HOSPITAL_COMMUNITY): Payer: Self-pay | Admitting: *Deleted

## 2019-12-28 NOTE — Telephone Encounter (Signed)
Error

## 2019-12-28 NOTE — Telephone Encounter (Signed)

## 2019-12-29 ENCOUNTER — Encounter: Payer: Medicaid Other | Admitting: *Deleted

## 2019-12-29 ENCOUNTER — Other Ambulatory Visit: Payer: Self-pay

## 2019-12-29 ENCOUNTER — Ambulatory Visit (HOSPITAL_COMMUNITY)
Admission: RE | Admit: 2019-12-29 | Discharge: 2019-12-29 | Disposition: A | Discharge status: Home / Self Care | Payer: Medicaid Other | Source: Ambulatory Visit | Attending: Cardiology | Admitting: Cardiology

## 2019-12-29 ENCOUNTER — Encounter (HOSPITAL_COMMUNITY): Payer: Self-pay

## 2019-12-29 DIAGNOSIS — R072 Precordial pain: Secondary | ICD-10-CM | POA: Insufficient documentation

## 2019-12-29 DIAGNOSIS — R0602 Shortness of breath: Secondary | ICD-10-CM | POA: Insufficient documentation

## 2019-12-29 DIAGNOSIS — Z006 Encounter for examination for normal comparison and control in clinical research program: Secondary | ICD-10-CM

## 2019-12-29 MED ORDER — NITROGLYCERIN 0.4 MG SL SUBL
SUBLINGUAL_TABLET | SUBLINGUAL | Status: AC
  Filled 2019-12-29: qty 2

## 2019-12-29 MED ORDER — NITROGLYCERIN 0.4 MG SL SUBL
0.8000 mg | SUBLINGUAL_TABLET | Freq: Once | SUBLINGUAL | Status: AC
  Administered 2019-12-29: 0.8 mg via SUBLINGUAL

## 2019-12-29 MED ORDER — IOHEXOL 350 MG/ML SOLN
100.0000 mL | Freq: Once | INTRAVENOUS | Status: AC | PRN
  Administered 2019-12-29: 100 mL via INTRAVENOUS

## 2019-12-29 NOTE — Research (Signed)
CADFEM Informed Consent                  Subject Name:   Ian [name]. Simm   Subject met inclusion and exclusion criteria.  The informed consent form, study requirements and expectations were reviewed with the subject and questions and concerns were addressed prior to the signing of the consent form.  The subject verbalized understanding of the trial requirements.  The subject agreed to participate in the CADFEM trial and signed the informed consent.  The informed consent was obtained prior to performance of any protocol-specific procedures for the subject.  A copy of the signed informed consent was given to the subject and a copy was placed in the subject's medical record.   [place] Vaishali Baise, Research Assistant  12/29/2019 12:19 p.m.

## 2020-02-21 NOTE — Progress Notes (Signed)
Cardiology Office Note   Date:  02/22/2020   ID:  Ian [name], DOB 18-Oct-1960, MRN [id number]  PCP:  April Manson, NP  Cardiologist:   No primary care provider on file. Referring:  April Manson, NP  Chief Complaint  Patient presents with  . Shortness of Breath      History of Present Illness: Ian [name] is a 59 y.o. male who is referred by April Manson, NP for evaluation of aortic atherosclerosis and tortuous aorta.    I saw him in 2015 for evaluation of chest pain.   He had a negative perfusion study in 2015.  He had chest discomfort when I saw him last.  I sent him for coronary CTA and he had no CAD and zero calcium.    I do note that he had a stroke in 2016 and I reviewed these records.  He was treated with Plavix for 3 months  Apparently he saw a pulmonologist recently.  He does have emphysema on a previous CT.  He has been treated with a bronchodilator and he thinks this does help.  He is try to cut down his cigarettes.  He denies any chest discomfort, neck or arm discomfort.  He said no new palpitations, presyncope or syncope.  He has had no weight gain or edema.  He has a cough particularly in the morning.   Past Medical History:  Diagnosis Date  . Hx of cardiovascular stress test    Lexiscan Myoview (09/2013):  No ischemia, EF 62%; NORMAL  . Leg pain    ABIs (09/29/13):  Normal.  . Stomach ulcer   . Stroke (HCC)   . Tobacco abuse     Past Surgical History:  Procedure Laterality Date  . ANKLE FRACTURE SURGERY    . APPENDECTOMY       Current Outpatient Medications  Medication Sig Dispense Refill  . ALPRAZolam (XANAX) 1 MG tablet Take 1 mg by mouth 3 (three) times daily as needed for anxiety.     Marland Kitchen atorvastatin (LIPITOR) 40 MG tablet Take 1 tablet (40 mg total) by mouth daily. 90 tablet 1  . Choline Fenofibrate 135 MG capsule Take 135 mg by mouth daily.     . clopidogrel (PLAVIX) 75 MG tablet Take 1 tablet (75 mg total) by mouth daily. 30 tablet  0  . gabapentin (NEURONTIN) 400 MG capsule Take 400 mg by mouth 3 (three) times daily.    . Glycopyrrolate-Formoterol (BEVESPI AEROSPHERE) 9-4.8 MCG/ACT AERO Inhale 2 puffs into the lungs 2 (two) times daily. 10.7 g 5  . lisinopril (ZESTRIL) 10 MG tablet Take by mouth.    . pantoprazole (PROTONIX) 40 MG tablet Take 40 mg by mouth daily.    . tamsulosin (FLOMAX) 0.4 MG CAPS capsule Take 0.4 mg by mouth daily.     . Vitamin D, Ergocalciferol, (DRISDOL) 50000 UNITS CAPS capsule TAKE ONE CAPSULE BY MOUTH WEEKLY     No current facility-administered medications for this visit.    Allergies:   Asa [aspirin], Ibuprofen, and Tylenol [acetaminophen]    ROS:  Please see the history of present illness.   Otherwise, review of systems are positive for none.   All other systems are reviewed and negative.    PHYSICAL EXAM: VS:  BP 110/60   Pulse 80   Ht 5\' 9"  (1.753 m)   Wt 212 lb (96.2 kg)   BMI 31.31 kg/m  , BMI Body mass index is 31.31 kg/m. GENERAL:  Well appearing NECK:  No jugular venous distention, waveform within normal limits, carotid upstroke brisk and symmetric, no bruits, no thyromegaly LUNGS:  Few expiratory wheezes CHEST:  Unremarkable HEART:  PMI not displaced or sustained,S1 and S2 within normal limits, no S3, no S4, no clicks, no rubs, no murmurs ABD:  Flat, positive bowel sounds normal in frequency in pitch, no bruits, no rebound, no guarding, no midline pulsatile mass, no hepatomegaly, no splenomegaly EXT:  2 plus pulses throughout, no edema, no cyanosis no clubbing   EKG:  EKG is not ordered today.   Recent Labs: 12/13/2019: BUN 11; Creatinine, Ser 0.95; Potassium 4.3; Sodium 141    Lipid Panel    Component Value Date/Time   CHOL 160 12/13/2019 0844   TRIG 116 12/13/2019 0844   HDL 35 (L) 12/13/2019 0844   CHOLHDL 4.6 12/13/2019 0844   CHOLHDL 7.3 11/09/2014 1438   VLDL UNABLE TO CALCULATE IF TRIGLYCERIDE OVER 400 mg/dL 61/44/3154 0086   LDLCALC 104 (H) 12/13/2019  0844      Wt Readings from Last 3 Encounters:  02/22/20 212 lb (96.2 kg)  12/09/19 226 lb (102.5 kg)  03/07/19 216 lb (98 kg)      Other studies Reviewed: Additional studies/ records that were reviewed today include: CT Review of the above records demonstrates:  Please see elsewhere in the note.     ASSESSMENT AND PLAN:  AORTIC ATHEROSCLEROSIS:    The patient does have aortic atherosclerosis and needs continued risk reduction.  However, no further testing is indicated.  SOB:   This is likely pulmonary given the lung findings on CT in the absence of Neri disease.  No further cardiac work-up is needed.  Needs to stop smoking.  He can give me the name I could not find the name of his pulmonologist and he is to follow-up with them and to continue his bronchodilators.   HTN:    His blood pressure is controlled.  No change in therapy.  TOBACCO ABUSE:    He is trying to cut back on his cigarettes.  He has been smoking since he was 9.  RISK REDUCTION:   I did increase his statin at a previous visit and I will check a fasting lipid profile.  STROKE:    He remains on Plavix.  COVID EDUCATION: He did get his vaccine.  Current medicines are reviewed at length with the patient today.  The patient does not have concerns regarding medicines.  The following changes have been made:  no change  Labs/ tests ordered today include:   No orders of the defined types were placed in this encounter.    Disposition:   FU with as needed.   Signed, Rollene Rotunda, MD  02/22/2020 9:52 AM    [facility]

## 2020-02-22 ENCOUNTER — Other Ambulatory Visit: Payer: Self-pay

## 2020-02-22 ENCOUNTER — Encounter: Payer: Self-pay | Admitting: Cardiology

## 2020-02-22 ENCOUNTER — Ambulatory Visit (INDEPENDENT_AMBULATORY_CARE_PROVIDER_SITE_OTHER): Payer: Medicaid Other | Admitting: Cardiology

## 2020-02-22 VITALS — BP 110/60 | HR 80 | Ht 69.0 in | Wt 212.0 lb

## 2020-02-22 DIAGNOSIS — I10 Essential (primary) hypertension: Secondary | ICD-10-CM

## 2020-02-22 DIAGNOSIS — I639 Cerebral infarction, unspecified: Secondary | ICD-10-CM

## 2020-02-22 DIAGNOSIS — I700 Atherosclerosis of aorta: Secondary | ICD-10-CM | POA: Diagnosis not present

## 2020-02-22 DIAGNOSIS — R0602 Shortness of breath: Secondary | ICD-10-CM | POA: Diagnosis not present

## 2020-02-22 DIAGNOSIS — Z7189 Other specified counseling: Secondary | ICD-10-CM

## 2020-02-22 NOTE — Patient Instructions (Signed)
Medication Instructions:  The current medical regimen is effective;  continue present plan and medications.  *If you need a refill on your cardiac medications before your next appointment, please call your pharmacy*  Lab Work: Please have fasting Lipid panel and ALT for DX: E78.5 with results faxed to Dr Fayrene Fearing [place] [phone].  If you have labs (blood work) drawn today and your tests are completely normal, you will receive your results only by: Marland Kitchen MyChart Message (if you have MyChart) OR . A paper copy in the mail If you have any lab test that is abnormal or we need to change your treatment, we will call you to review the results.  Follow-Up: We recommend signing up for the patient portal called "MyChart".  Sign up information is provided on this After Visit Summary.  MyChart is used to connect with patients for Virtual Visits (Telemedicine).  Patients are able to view lab/test results, encounter notes, upcoming appointments, etc.  Non-urgent messages can be sent to your provider as well.   To learn more about what you can do with MyChart, go to [email or web address].    Follow up with Dr Antoine Poche as needed.  Thank you for choosing [facility]!!

## 2020-04-22 ENCOUNTER — Other Ambulatory Visit: Payer: Self-pay | Admitting: Cardiology

## 2020-04-22 DIAGNOSIS — E785 Hyperlipidemia, unspecified: Secondary | ICD-10-CM

## 2020-06-19 ENCOUNTER — Other Ambulatory Visit: Payer: Self-pay | Admitting: Acute Care

## 2020-08-23 ENCOUNTER — Ambulatory Visit (INDEPENDENT_AMBULATORY_CARE_PROVIDER_SITE_OTHER): Payer: Medicaid Other | Admitting: Adult Health

## 2020-08-23 ENCOUNTER — Encounter: Payer: Self-pay | Admitting: Adult Health

## 2020-08-23 ENCOUNTER — Other Ambulatory Visit: Payer: Self-pay

## 2020-08-23 DIAGNOSIS — R490 Dysphonia: Secondary | ICD-10-CM | POA: Diagnosis not present

## 2020-08-23 DIAGNOSIS — J449 Chronic obstructive pulmonary disease, unspecified: Secondary | ICD-10-CM

## 2020-08-23 DIAGNOSIS — F17200 Nicotine dependence, unspecified, uncomplicated: Secondary | ICD-10-CM

## 2020-08-23 NOTE — Progress Notes (Signed)
@Patient  ID: , male    DOB: November 25, 1960, 60 y.o.   MRN: [id number]  Chief Complaint  Patient presents with  . Follow-up    Referring provider: [id number], NP  HPI: 60 year old male active smoker followed for COPD Medical history significant for previous stroke    TEST/EVENTS :  03/12/2018: Postbronchodilator responses recorded FVC 3.12 L 71% predicted FEV1 2.4 L 71% predicted, 20% bronchodilator response Ratio 77 SVC 69% TLC 68% RV/TLC ratio 125% ERV 16% DLCO 59%  Echocardiogram: 10/2014 Study Conclusions - Left ventricle: Technically difficult study. The cavity size was normal. Wall thickness was increased in a pattern of mild LVH. The estimated ejection fraction was 60%. Wall motion was normal; there were no regional wall motion abnormalities. - Right ventricle: The cavity size was normal. Systolic function was normal. - Impressions: No cardiac source of embolism was identified, but cannot be ruled out on the basis of this examination. Impressions: - No cardiac source of embolism was identified, but cannot be ruled out on the basis of this examination.  Low-dose CT screening January 2021 was negative with a lung RADS 1, mild to moderate emphysema  08/23/2020 Follow up : COPD  Patient returns for a 1 year month follow-up.  Last seen September 2020.  Patient has underlying COPD.  Says overall breathing is doing about the same.  He gets short of breath with intermittent activities.  Is disabled.  Is somewhat sedentary.  Denies any flare of cough or wheezing.  Has minimum albuterol use.  He does continue to smoke.  Smoking cessation was discussed.  Patient remains on Bevespi inhaler twice daily.  He denies any increased cough or wheezing.  No recent steroid use.  Patient is on ACE inhibitor. Patient does participate in the low-dose CT chest screening program.  As above January 2021 was his last scan that showed negative results.  We  discussed that he is due for his repeat scan. Not active, lots of joint issues  Patient complains of a 2-year history of chronic hoarseness and sore throat he says intermittently has an area in the back of his throat that kind of swells up.  He denies any hemoptysis or weight loss.  Allergies  Allergen Reactions  . Asa [Aspirin] Other (See Comments)  . Ibuprofen Other (See Comments)  . Tylenol [Acetaminophen] Other (See Comments)    Immunization History  Administered Date(s) Administered  . Influenza, Seasonal, Injecte, Preservative Fre 03/19/2015  . Janssen (J&J) SARS-COV-2 Vaccination 11/24/2019  . PFIZER(Purple Top)SARS-COV-2 Vaccination 05/17/2020  . Pneumococcal Polysaccharide-23 01/27/2017  . Tdap 07/05/2005, 10/22/2015    Past Medical History:  Diagnosis Date  . Hx of cardiovascular stress test    Lexiscan Myoview (09/2013):  No ischemia, EF 62%; NORMAL  . Leg pain    ABIs (09/29/13):  Normal.  . Stomach ulcer   . Stroke (HCC)   . Tobacco abuse     Tobacco History: Social History   Tobacco Use  Smoking Status Current Every Day Smoker  . Packs/day: 2.25  . Years: 35.00  . Pack years: 78.75  . Types: Cigarettes  Smokeless Tobacco Never Used  Tobacco Comment   smokes 1 ppd 08/23/20   Ready to quit: No Counseling given: Yes Comment: smokes 1 ppd 08/23/20   Outpatient Medications Prior to Visit  Medication Sig Dispense Refill  . albuterol (VENTOLIN HFA) 108 (90 Base) MCG/ACT inhaler TAKE 2 PUFFS BY MOUTH EVERY 6 HOURS AS NEEDED FOR WHEEZE OR SHORTNESS OF BREATH  8.5 each 0  . ALPRAZolam (XANAX) 1 MG tablet Take 1 mg by mouth 3 (three) times daily as needed for anxiety.     Marland Kitchen atorvastatin (LIPITOR) 40 MG tablet TAKE 1 TABLET BY MOUTH EVERY DAY 90 tablet 1  . Choline Fenofibrate 135 MG capsule Take 135 mg by mouth daily.     . clopidogrel (PLAVIX) 75 MG tablet Take 1 tablet (75 mg total) by mouth daily. 30 tablet 0  . gabapentin (NEURONTIN) 400 MG capsule Take 400  mg by mouth 2 (two) times daily.    . Glycopyrrolate-Formoterol (BEVESPI AEROSPHERE) 9-4.8 MCG/ACT AERO Inhale 2 puffs into the lungs 2 (two) times daily. 10.7 g 5  . pantoprazole (PROTONIX) 40 MG tablet Take 40 mg by mouth daily.    . tamsulosin (FLOMAX) 0.4 MG CAPS capsule Take 0.4 mg by mouth daily.     . Vitamin D, Ergocalciferol, (DRISDOL) 50000 UNITS CAPS capsule TAKE ONE CAPSULE BY MOUTH WEEKLY    . lisinopril (ZESTRIL) 10 MG tablet Take by mouth.     No facility-administered medications prior to visit.     Review of Systems:   Constitutional:   No  weight loss, night sweats,  Fevers, chills, + fatigue, or  lassitude.  HEENT:   No headaches,  Difficulty swallowing,  Tooth/dental problems, or  Sore throat,                No sneezing, itching, ear ache, nasal congestion, post nasal drip,   CV:  No chest pain,  Orthopnea, PND, swelling in lower extremities, anasarca, dizziness, palpitations, syncope.   GI  No heartburn, indigestion, abdominal pain, nausea, vomiting, diarrhea, change in bowel habits, loss of appetite, bloody stools.   Resp:    No chest wall deformity  Skin: no rash or lesions.  GU: no dysuria, change in color of urine, no urgency or frequency.  No flank pain, no hematuria   MS:  No joint pain or swelling.  No decreased range of motion.  No back pain.    Physical Exam  BP 108/60 (BP Location: Left Arm, Patient Position: Sitting, Cuff Size: Normal)   Pulse 74   Temp 98.4 F (36.9 C) (Temporal)   Ht 5\' 9"  (1.753 m)   Wt 211 lb 12.8 oz (96.1 kg)   SpO2 94%   BMI 31.28 kg/m   GEN: A/Ox3; pleasant , NAD, well nourished    HEENT:  [place]/AT,   NOSE-clear, THROAT-clear, no lesions, no postnasal drip or exudate noted.  Posterior pharynx is clear.  No obvious lesions noted.  NECK:  Supple w/ fair ROM; no JVD; normal carotid impulses w/o bruits; no thyromegaly or nodules palpated; no lymphadenopathy.    RESP  Clear  P & A; w/o, wheezes/ rales/ or rhonchi. no  accessory muscle use, no dullness to percussion  CARD:  RRR, no m/r/g, no peripheral edema, pulses intact, no cyanosis or clubbing.  GI:   Soft & nt; nml bowel sounds; no organomegaly or masses detected.   Musco: Warm bil, no deformities or joint swelling noted.   Neuro: alert, no focal deficits noted.    Skin: Warm, no lesions or rashes    Lab Results:  BNP No results found for: BNP  ProBNP No results found for: PROBNP  Imaging: No results found.    PFT Results Latest Ref Rng & Units 03/12/2018  FVC-Pre L 2.68  FVC-Predicted Pre % 61  FVC-Post L 3.12  FVC-Predicted Post % 71  Pre FEV1/FVC % %  74  Post FEV1/FCV % % 77  FEV1-Pre L 1.99  FEV1-Predicted Pre % 59  FEV1-Post L 2.40  DLCO uncorrected ml/min/mmHg 16.81  DLCO UN[facility]% % 59  DLVA Predicted % 79    No results found for: NITRICOXIDE      Assessment & Plan:   COPD (chronic obstructive pulmonary disease) (HCC) Compensated on present regimen.  Patient is encouraged on smoking cessation.  Needs updated low-dose CT chest screening.  We will contact the screening program to get him set up.  Plan  Patient Instructions  Refer to ENT for chronic hoarseness and sore throat.  Time for your LDCT Chest screening, will send them a message  Continue on BEVESPI 2 puffs Twice daily  , rinse after use.  Albuterol inhaler As needed   Activity as tolerated Work on not smoking .  Mucinex DM Twice daily  As needed  Cough/congestion  Follow up with Dr. Tonia Brooms in 6 months and As needed   Please contact office for sooner follow up if symptoms do not improve or worsen or seek emergency care       Current smoker Smoking cessation discussed  Hoarseness Chronic hoarseness, sore throat questionable etiology.  Patient is a heavy smoker.  Refer to ENT for further evaluation.     Rubye Oaks, NP 08/23/2020

## 2020-08-23 NOTE — Addendum Note (Signed)
Addended by: Delrae Rend on: 08/23/2020 04:56 PM   Modules accepted: Orders

## 2020-08-23 NOTE — Assessment & Plan Note (Signed)
Smoking cessation discussed 

## 2020-08-23 NOTE — Patient Instructions (Addendum)
Refer to ENT for chronic hoarseness and sore throat.  Time for your LDCT Chest screening, will send them a message  Continue on BEVESPI 2 puffs Twice daily  , rinse after use.  Albuterol inhaler As needed   Activity as tolerated Work on not smoking .  Mucinex DM Twice daily  As needed  Cough/congestion  Follow up with Dr. Tonia Brooms in 6 months and As needed   Please contact office for sooner follow up if symptoms do not improve or worsen or seek emergency care

## 2020-08-23 NOTE — Assessment & Plan Note (Signed)
Chronic hoarseness, sore throat questionable etiology.  Patient is a heavy smoker.  Refer to ENT for further evaluation.

## 2020-08-23 NOTE — Assessment & Plan Note (Signed)
Compensated on present regimen.  Patient is encouraged on smoking cessation.  Needs updated low-dose CT chest screening.  We will contact the screening program to get him set up.  Plan  Patient Instructions  Refer to ENT for chronic hoarseness and sore throat.  Time for your LDCT Chest screening, will send them a message  Continue on BEVESPI 2 puffs Twice daily  , rinse after use.  Albuterol inhaler As needed   Activity as tolerated Work on not smoking .  Mucinex DM Twice daily  As needed  Cough/congestion  Follow up with Dr. Tonia Brooms in 6 months and As needed   Please contact office for sooner follow up if symptoms do not improve or worsen or seek emergency care

## 2020-08-29 NOTE — Progress Notes (Signed)
Thanks for seeing him in follow up   [name], DO [facility] 08/29/2020 6:33 PM

## 2020-11-26 ENCOUNTER — Other Ambulatory Visit: Payer: Self-pay

## 2020-11-26 DIAGNOSIS — E785 Hyperlipidemia, unspecified: Secondary | ICD-10-CM

## 2020-11-26 MED ORDER — ATORVASTATIN CALCIUM 40 MG PO TABS: 1.0000 | ORAL_TABLET | Freq: Every day | ORAL | 3 refills | Status: DC

## 2021-02-20 ENCOUNTER — Encounter: Payer: Self-pay | Admitting: Pulmonary Disease

## 2021-02-20 ENCOUNTER — Other Ambulatory Visit: Payer: Self-pay

## 2021-02-20 ENCOUNTER — Ambulatory Visit: Payer: Medicaid Other | Admitting: Pulmonary Disease

## 2021-02-20 VITALS — BP 130/78 | HR 73 | Temp 98.3°F | Ht 69.0 in | Wt 211.0 lb

## 2021-02-20 DIAGNOSIS — J449 Chronic obstructive pulmonary disease, unspecified: Secondary | ICD-10-CM | POA: Diagnosis not present

## 2021-02-20 DIAGNOSIS — Z716 Tobacco abuse counseling: Secondary | ICD-10-CM

## 2021-02-20 DIAGNOSIS — F17210 Nicotine dependence, cigarettes, uncomplicated: Secondary | ICD-10-CM | POA: Diagnosis not present

## 2021-02-20 DIAGNOSIS — F17200 Nicotine dependence, unspecified, uncomplicated: Secondary | ICD-10-CM

## 2021-02-20 DIAGNOSIS — Z720 Tobacco use: Secondary | ICD-10-CM

## 2021-02-20 MED ORDER — BREZTRI AEROSPHERE 160-9-4.8 MCG/ACT IN AERO: 2.0000 | INHALATION_SPRAY | Freq: Two times a day (BID) | RESPIRATORY_TRACT | 5 refills | Status: DC

## 2021-02-20 MED ORDER — BREZTRI AEROSPHERE 160-9-4.8 MCG/ACT IN AERO: 2.0000 | INHALATION_SPRAY | Freq: Two times a day (BID) | RESPIRATORY_TRACT | 0 refills | Status: DC

## 2021-02-20 MED ORDER — ALBUTEROL SULFATE HFA 108 (90 BASE) MCG/ACT IN AERS: 2.0000 | INHALATION_SPRAY | Freq: Four times a day (QID) | RESPIRATORY_TRACT | 5 refills | Status: DC | PRN

## 2021-02-20 NOTE — Progress Notes (Signed)
Synopsis: Referred in August 2019 for wheezing/SOB by Ian [name], Ian L, NP  Subjective:   PATIENT ID: Ian [name] GENDER: male DOB: 05/08/1961, MRN: [id number]  Chief Complaint  Patient presents with   Follow-up    Sob on conversation and movement    Initial office visit: PMH of tobacco abuse and CVA. He was recently started on an inhaler, twice in the morning and twice at night. He is still having trouble breathing.   He has been smoking since 60 years old, and by teenager he was smoking regularly. He is currently down to a pack per day. At his max he was smoking 2-3 packs per day, >50 pack year history. He has never had PFTs before or seen a pulmonologist. His biggest trouble with his breathing is with exertion. He occasionally has chest tightness with exertion. He admits to chest pain behind his sternum with climbing up a hill.  His chest pain is associated with shortness of breath.  His shortness of breath is also associated with cough in the morning and cough at night.  He does have mild amount of sputum production that is usually whitish in color.  He wheezes on occasion.  After being started on his inhaler Symbicort 2 puffs twice daily he does feel better.  FMHX father with MI, grandfather with MI and brother with CAD s/p CABGX5.   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  OV 03/12/2018: He is doing some better. He is still smoking. He had PFTs completed today, appears to have a mixed restrictive and obstructive defect. He feels like the inhalers are working. He still has dyspnea on exertion.  He does have breathlessness with climbing stairs or climbing he will.  He is cough may be some better.  He does wake up in the morning with sputum production which is yellowish in color.  He denies hemoptysis.  I reviewed recent CT imaging with the patient.  There is evidence of centrilobular emphysema.   No found etiology of his weight loss.  The patient does admit that he difficulty with reading.  And has struggled with his medications at home and being able to read and interpret the instructions on the bottles inhalers.  He believes that he is doing them correctly however.  OV 01/27/2019: Patient here today for follow-up of COPD.  Last seen by me back in September.  Otherwise doing well.  Unfortunately has been unable to purchase his medications due to cost.  Patient also has literacy problems and has much difficulty understanding when to use his medications.  We will try to simplify his regimen today.  He is very concerned that he could develop lung cancer at some point.  Unfortunately he is still smoking around 5 cigarettes/day.  He knows that he needs to quit but he is having much difficulty with that as well.  Otherwise he continues to have dyspnea on exertion and shortness of breath.  Occasional wheezing.  He also carries a cup around with him for the amount of chronic sputum that he brings up.  Denies fevers.  But states for the past several weeks he has been feeling much worse.  OV 02/20/2021: Patient here today for follow-up regarding COPD.  Unfortunately Ian [name] is still smoking.  He has been smoking for a long time he was able to cut down to 5 cigarettes a day at the last time we saw  him in 2020.  At this point he still smoking about a pack to a pack and 1/2/day.  He is ran out of all of his medications.  He has not been using any inhalers.  He does have a rescue inhaler that he uses on occasion.  Complains of chest tightness and shortness of breath.  Does have daily cough and sputum production.     Past Medical History:  Diagnosis Date   Hx of cardiovascular stress test    Lexiscan Myoview (09/2013):  No ischemia, EF 62%; NORMAL   Leg pain    ABIs (09/29/13):  Normal.   Stomach ulcer    Stroke (HCC)    Tobacco abuse      Family History  Problem Relation Age of Onset   CAD Father 35        Died of MI   Heart failure Mother 64       Alive   Diabetes Mother    Diabetes Sister    CAD Paternal Grandfather 64       Died of MI   Heart disease Daughter        Rapid heart rate   CAD Brother    Cancer Maternal Grandmother        Throat cancer      Social History   Socioeconomic History   Marital status: Divorced    Spouse name: Not on file   Number of children: 5   Years of education: Not on file   Highest education level: Not on file  Occupational History    Employer: NOT EMPLOYED  Tobacco Use   Smoking status: Every Day    Packs/day: 2.25    Years: 35.00    Pack years: 78.75    Types: Cigarettes   Smokeless tobacco: Never   Tobacco comments:    smokes 1 ppd 08/23/20  Substance and Sexual Activity   Alcohol use: Not Currently    Alcohol/week: 9.0 - 13.0 standard drinks    Types: 1 Glasses of wine, 8 - 12 Cans of beer per week    Comment: Drank beer, for 35 years, quit 5 years ago    Drug use: No   Sexual activity: Not on file  Other Topics Concern   Not on file  Social History Narrative   Lives with alone.  Cannot read and write.     Social Determinants of Health   Financial Resource Strain: Not on file  Food Insecurity: Not on file  Transportation Needs: Not on file  Physical Activity: Not on file  Stress: Not on file  Social Connections: Not on file  Intimate Partner Violence: Not on file     Allergies  Allergen Reactions   Asa [Aspirin] Other (See Comments)   Ibuprofen Other (See Comments)   Tylenol [Acetaminophen] Other (See Comments)     Outpatient Medications Prior to Visit  Medication Sig Dispense Refill   albuterol (VENTOLIN HFA) 108 (90 Base) MCG/ACT inhaler TAKE 2 PUFFS BY MOUTH EVERY 6 HOURS AS NEEDED FOR WHEEZE OR SHORTNESS OF BREATH 8.5 each 0   ALPRAZolam (XANAX) 1 MG tablet Take 1 mg by mouth 3 (three) times daily as needed for anxiety.      atorvastatin (LIPITOR) 40 MG tablet Take 1 tablet (40 mg total) by mouth daily. 90  tablet 3   Choline Fenofibrate 135 MG capsule Take 135 mg by mouth daily.      clopidogrel (PLAVIX) 75 MG tablet Take 1 tablet (75 mg total)  by mouth daily. 30 tablet 0   gabapentin (NEURONTIN) 400 MG capsule Take 400 mg by mouth 2 (two) times daily.     pantoprazole (PROTONIX) 40 MG tablet Take 40 mg by mouth daily.     tamsulosin (FLOMAX) 0.4 MG CAPS capsule Take 0.4 mg by mouth daily.      Vitamin D, Ergocalciferol, (DRISDOL) 50000 UNITS CAPS capsule TAKE ONE CAPSULE BY MOUTH WEEKLY     Glycopyrrolate-Formoterol (BEVESPI AEROSPHERE) 9-4.8 MCG/ACT AERO Inhale 2 puffs into the lungs 2 (two) times daily. 10.7 g 5   lisinopril (ZESTRIL) 10 MG tablet Take by mouth.     No facility-administered medications prior to visit.    Review of Systems  Constitutional:  Negative for chills, fever, malaise/fatigue and weight loss.  HENT:  Negative for hearing loss, sore throat and tinnitus.   Eyes:  Negative for blurred vision and double vision.  Respiratory:  Positive for cough, shortness of breath and wheezing. Negative for hemoptysis, sputum production and stridor.   Cardiovascular:  Negative for chest pain, palpitations, orthopnea, leg swelling and PND.  Gastrointestinal:  Negative for abdominal pain, constipation, diarrhea, heartburn, nausea and vomiting.  Genitourinary:  Negative for dysuria, hematuria and urgency.  Musculoskeletal:  Negative for joint pain and myalgias.  Skin:  Negative for itching and rash.  Neurological:  Negative for dizziness, tingling, weakness and headaches.  Endo/Heme/Allergies:  Negative for environmental allergies. Does not bruise/bleed easily.  Psychiatric/Behavioral:  Negative for depression. The patient is not nervous/anxious and does not have insomnia.   All other systems reviewed and are negative.   Objective:  Physical Exam Vitals reviewed.  Constitutional:      General: He is not in acute distress.    Appearance: He is well-developed. He is obese.  HENT:      Head: Normocephalic and atraumatic.  Eyes:     General: No scleral icterus.    Conjunctiva/sclera: Conjunctivae normal.     Pupils: Pupils are equal, round, and reactive to light.  Neck:     Vascular: No JVD.     Trachea: No tracheal deviation.  Cardiovascular:     Rate and Rhythm: Normal rate and regular rhythm.     Heart sounds: Normal heart sounds. No murmur heard. Pulmonary:     Effort: Pulmonary effort is normal. No tachypnea, accessory muscle usage or respiratory distress.     Breath sounds: No stridor. Wheezing present. No rhonchi or rales.  Abdominal:     General: Bowel sounds are normal. There is no distension.     Palpations: Abdomen is soft.     Tenderness: There is no abdominal tenderness.  Musculoskeletal:        General: No tenderness.     Cervical back: Neck supple.  Lymphadenopathy:     Cervical: No cervical adenopathy.  Skin:    General: Skin is warm and dry.     Capillary Refill: Capillary refill takes less than 2 seconds.     Findings: No rash.  Neurological:     Mental Status: He is alert and oriented to person, place, and time.  Psychiatric:        Behavior: Behavior normal.     Vitals:   02/20/21 1213  BP: 130/78  Pulse: 73  Temp: 98.3 F (36.8 C)  TempSrc: Oral  SpO2: 97%  Weight: 211 lb (95.7 kg)  Height: 5\' 9"  (1.753 m)   97% on RA BMI Readings from Last 3 Encounters:  02/20/21 31.16 kg/m  08/23/20 31.28 kg/m  02/22/20 31.31 kg/m   Wt Readings from Last 3 Encounters:  02/20/21 211 lb (95.7 kg)  08/23/20 211 lb 12.8 oz (96.1 kg)  02/22/20 212 lb (96.2 kg)    CBC    Component Value Date/Time   WBC 7.9 11/07/2014 1358   RBC 5.44 11/07/2014 1358   HGB 16.3 11/07/2014 1358   HCT 49.0 11/07/2014 1358   PLT 202 11/07/2014 1358   MCV 90.1 11/07/2014 1358   MCH 30.0 11/07/2014 1358   MCHC 33.3 11/07/2014 1358   RDW 12.8 11/07/2014 1358   LYMPHSABS 1.7 11/07/2014 1358   MONOABS 0.6 11/07/2014 1358   EOSABS 0.2 11/07/2014  1358   BASOSABS 0.0 11/07/2014 1358    Chest Imaging:  11/07/2014 IMPRESSION: No active cardiopulmonary disease.  02/18/2018: CT chest No acute cardiopulmonary disease, evidence of centrilobular emphysema The patient's images have been independently reviewed by me.    07/06/2019 lung cancer screening CT: Lung RADS 1, no concerning lesion. The patient's images have been independently reviewed by me.    Pulmonary Functions Testing Results: 03/12/2018: Postbronchodilator responses recorded FVC 3.12 [name] 71% predicted FEV1 2.4 [name] 71% predicted, 20% bronchodilator response Ratio 77 SVC 69% TLC 68% RV/TLC ratio 125% ERV 16% DLCO 59%  PFT Results Latest Ref Rng & Units 03/12/2018  FVC-Pre [name] 2.68  FVC-Predicted Pre % 61  FVC-Post [name] 3.12  FVC-Predicted Post % 71  Pre FEV1/FVC % % 74  Post FEV1/FCV % % 77  FEV1-Pre [name] 1.99  FEV1-Predicted Pre % 59  FEV1-Post [name] 2.40  DLCO uncorrected ml/min/mmHg 16.81  DLCO UN[facility]% % 59  DLVA Predicted % 79     FeNO: None   Pathology: None   Echocardiogram:   Study Conclusions - Left ventricle: Technically difficult study. The cavity size was   normal. Wall thickness was increased in a pattern of mild LVH.   The estimated ejection fraction was 60%. Wall motion was normal;   there were no regional wall motion abnormalities. - Right ventricle: The cavity size was normal. Systolic function   was normal. - Impressions: No cardiac source of embolism was identified, but   cannot be ruled out on the basis of this examination. Impressions: - No cardiac source of embolism was identified, but cannot be ruled   out on the basis of this examination.  Heart Catheterization: None     Assessment & Plan:     ICD-10-CM   1. Current smoker  F17.200 Ambulatory Referral for Lung Cancer Scre    2. Chronic obstructive pulmonary disease, unspecified COPD type (HCC)  J44.9     3. Tobacco abuse  Z72.0     4. Encounter for tobacco use cessation counseling   Z71.6        Discussion:  This is a 60 year old gentleman, current smoker, PFTs with mixed restrictive and obstructive defect, evidence of air trapping and reduced DLCO.  CT radiographic evidence of centrilobular emphysema.  Plan: Needs to remain on inhaler regimen. We will step up to [facility]. Samples given today.  As well as a new prescription. Refills for albuterol inhaler. Also send a message to our lung cancer screening program.  He needs to be reenrolled in this.    Current Outpatient Medications:    albuterol (VENTOLIN HFA) 108 (90 Base) MCG/ACT inhaler, TAKE 2 PUFFS BY MOUTH EVERY 6 HOURS AS NEEDED FOR WHEEZE OR SHORTNESS OF BREATH, Disp: 8.5 each, Rfl: 0   ALPRAZolam (XANAX) 1 MG tablet, Take 1 mg by mouth 3 (three)  times daily as needed for anxiety. , Disp: , Rfl:    atorvastatin (LIPITOR) 40 MG tablet, Take 1 tablet (40 mg total) by mouth daily., Disp: 90 tablet, Rfl: 3   Budeson-Glycopyrrol-Formoterol (BREZTRI AEROSPHERE) 160-9-4.8 MCG/ACT AERO, Inhale 2 puffs into the lungs in the morning and at bedtime., Disp: 5.9 g, Rfl: 0   Budeson-Glycopyrrol-Formoterol (BREZTRI AEROSPHERE) 160-9-4.8 MCG/ACT AERO, Inhale 2 puffs into the lungs in the morning and at bedtime., Disp: 10.7 g, Rfl: 5   Choline Fenofibrate 135 MG capsule, Take 135 mg by mouth daily. , Disp: , Rfl:    clopidogrel (PLAVIX) 75 MG tablet, Take 1 tablet (75 mg total) by mouth daily., Disp: 30 tablet, Rfl: 0   gabapentin (NEURONTIN) 400 MG capsule, Take 400 mg by mouth 2 (two) times daily., Disp: , Rfl:    pantoprazole (PROTONIX) 40 MG tablet, Take 40 mg by mouth daily., Disp: , Rfl:    tamsulosin (FLOMAX) 0.4 MG CAPS capsule, Take 0.4 mg by mouth daily. , Disp: , Rfl:    Vitamin D, Ergocalciferol, (DRISDOL) 50000 UNITS CAPS capsule, TAKE ONE CAPSULE BY MOUTH WEEKLY, Disp: , Rfl:    lisinopril (ZESTRIL) 10 MG tablet, Take by mouth., Disp: , Rfl:    [name], DO [facility] 02/20/2021  5:23 PM

## 2021-02-20 NOTE — Progress Notes (Signed)
Smoking Cessation Counseling:   The patient's current tobacco use: 0.25 ppd The patient was advised to quit and impact of smoking on their health.  I assessed the patient's willingness to attempt to quit. I provided methods and skills for cessation. We reviewed medication management of smoking session drugs if appropriate. Resources to help quit smoking were provided. A smoking cessation quit date was set: Oct 7th, 60th birthday  Follow-up was arranged in our clinic.  The amount of time spent counseling patient was 4 mins    [name], DO [facility] 02/20/2021 12:22 PM

## 2021-02-20 NOTE — Patient Instructions (Addendum)
Thank you for visiting Dr. Tonia Brooms at [facility]. Today we recommend the following:  Breztri samples and new prescription  Albuterol refills  Quit smoking  LDCT referral   Return in about 6 months (around 08/20/2021).    Please do your part to reduce the spread of COVID-19.

## 2021-02-21 ENCOUNTER — Other Ambulatory Visit: Payer: Self-pay | Admitting: *Deleted

## 2021-02-21 DIAGNOSIS — F17200 Nicotine dependence, unspecified, uncomplicated: Secondary | ICD-10-CM

## 2021-03-01 ENCOUNTER — Telehealth: Payer: Self-pay | Admitting: Pulmonary Disease

## 2021-03-01 NOTE — Telephone Encounter (Signed)
PA request was received from (pharmacy): [facility] in [place] Phone: Fax:  Medication name and strength: Hospital doctor Provider: BI  Was PA started with CMM?: Yes If yes, please enter KEY: [id number] Medication tried and failed: Anoro, Bevespi, Spiriva 1.45mcg and 2.79mcg, Stiolto and Symbicort Covered Alternatives:   PA was instantly approved until 03/01/22.   Pharmacy is aware of approval.

## 2021-03-14 IMAGING — CT CT HEART MORP W/ CTA COR W/ SCORE W/ CA W/CM &/OR W/O CM
4 of 7 series · 8 of 20 positions shown, 9 images · non-contrast
Comparison: None.
COMPARISON: None.

Addendum:
EXAM:
OVER-READ INTERPRETATION  CT CHEST

The following report is an over-read performed by radiologist Dr.
Benrabah Etoil [REDACTED] on 12/29/2019. This over-read
does not include interpretation of cardiac or coronary anatomy or
pathology. The coronary CTA interpretation by the cardiologist is
attached.
CLINICAL DATA: 58 year old male with shortness of breath. Medical
history includes hypertension, History of CVA and tobacco use.
Cardiac/Coronary  CT
TECHNIQUE: The patient was scanned on a Phillips Force scanner.

[Series 6: best diast 72 % · axial · 0.39mm/px · z∈[+1161,+1205]mm · 2 of 328 slices shown, 3 images]
[im 110/328  vessel]
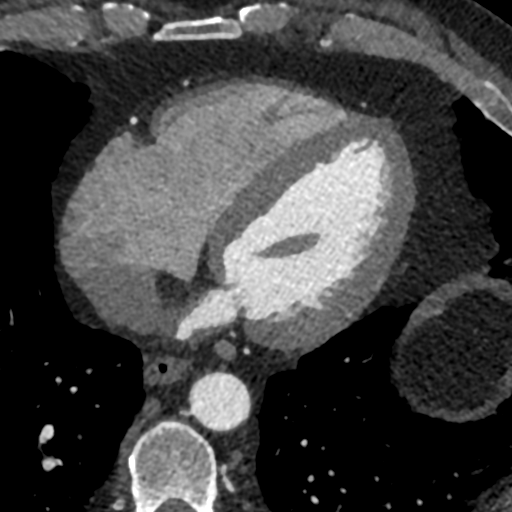
[im 110/328  lung]
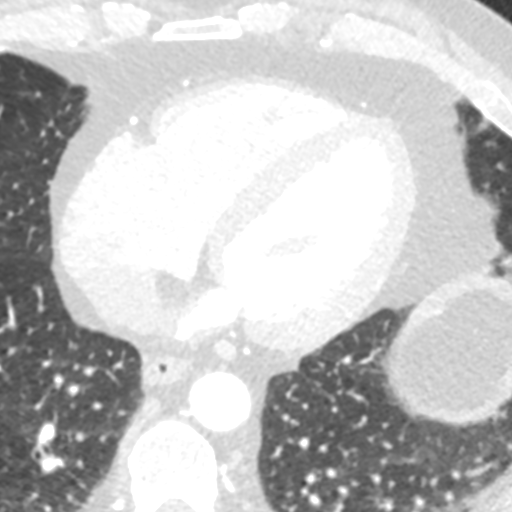
[im 219/328  vessel]
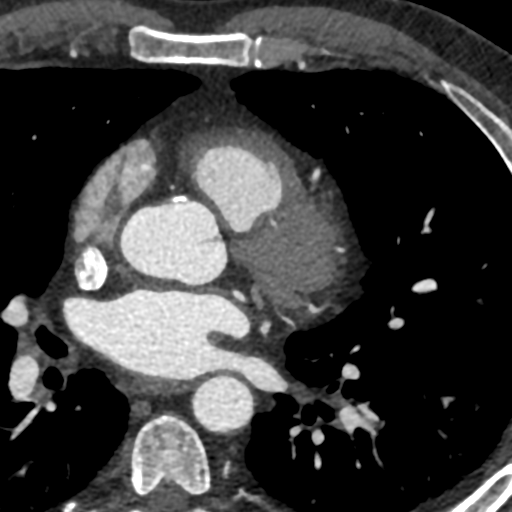

[Series 7: best syst 32 % · axial · 0.39mm/px · z∈[+1161,+1205]mm · 2 of 328 slices shown]
[im 110/328  vessel]
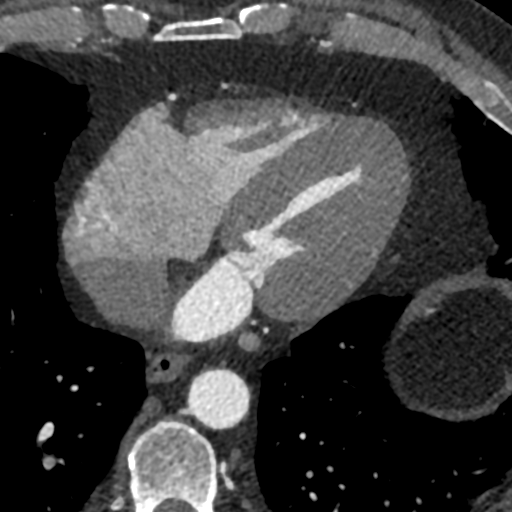
[im 219/328  vessel]
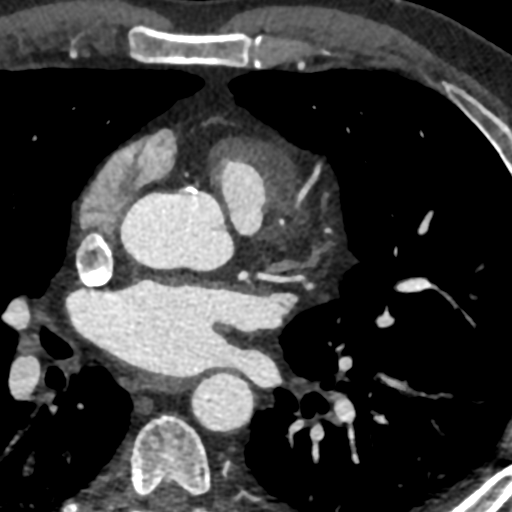

[Series 8: ts diast sharp 72 % · axial · 0.39mm/px · z∈[+1161,+1205]mm · 2 of 328 slices shown]
[im 110/328  lung]
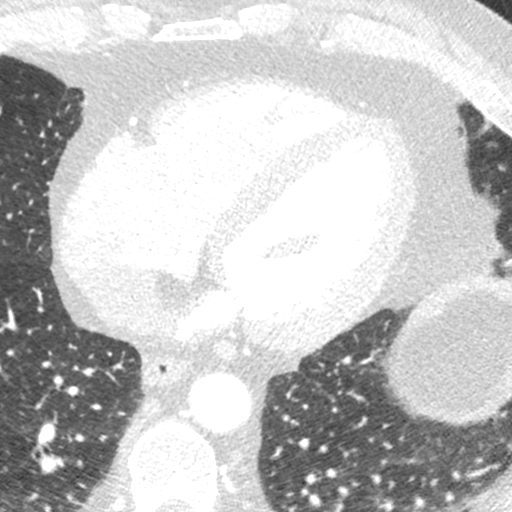
[im 219/328  lung]
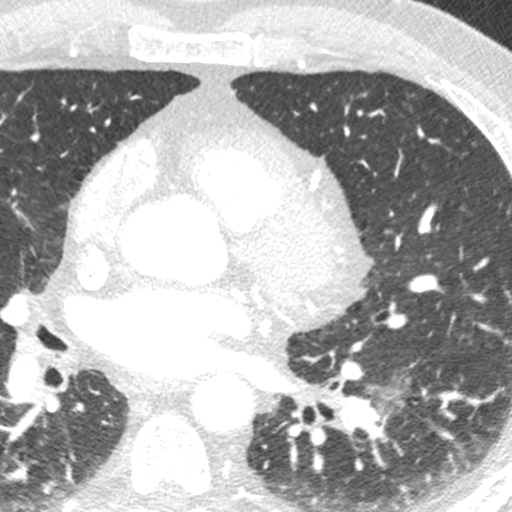

[Series 9: ts syst sharp 32 % · axial · 0.39mm/px · z∈[+1161,+1205]mm · 2 of 328 slices shown]
[im 110/328  lung]
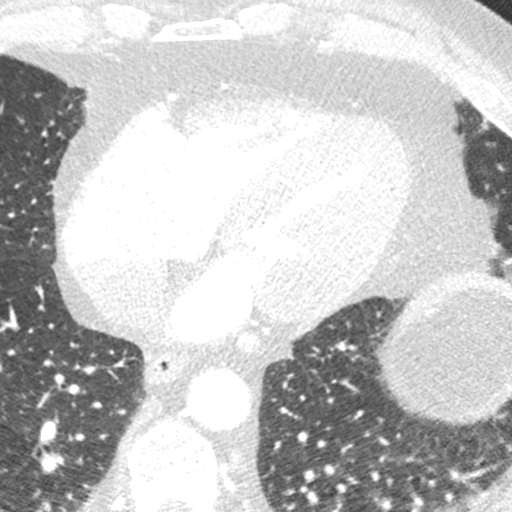
[im 219/328  lung]
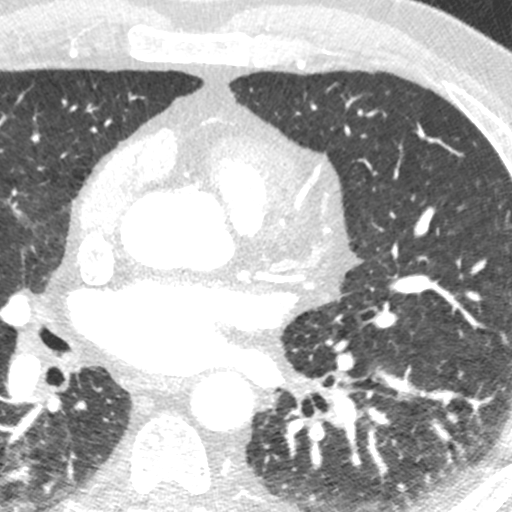

[8 of 20 positions shown; findings below may reference images not displayed]

FINDINGS: Vascular: Heart is normal size.  Visualized aorta normal caliber.

Mediastinum/Nodes: No adenopathy.

Lungs/Pleura: Visualized lungs clear.  No effusions.

Upper Abdomen: Imaging into the upper abdomen shows no acute
findings.

Musculoskeletal: Chest wall soft tissues are unremarkable. No acute
bony abnormality.
IMPRESSION: No acute or significant extracardiac abnormality.
FINDINGS: A 120 kV prospective scan was triggered in the descending thoracic
aorta at 111 HU's. Axial non-contrast 3 mm slices were carried out
through the heart. The data set was analyzed on a dedicated work
station and scored using the Agatson method. Gantry rotation speed
was 250 msecs and collimation was .6 mm. No beta blockade and 0.8 mg
of sl NTG was given. The 3D data set was reconstructed in 5%
intervals of the 67-82 % of the R-R cycle. Diastolic phases were
analyzed on a dedicated work station using MPR, MIP and VRT modes.
The patient received 80 cc of contrast.

Aorta: Normal size.  No calcifications.  No dissection.

Aortic Valve:  Trileaflet.  No calcifications.

Coronary Arteries:  Normal coronary origin.  Right dominance.

RCA is a large dominant artery that gives rise to PDA and PLVB.
There is no plaque.

Left main is a large artery that gives rise to LAD and LCX arteries.

LAD is a large vessel that has no plaque.

LCX is a non-dominant artery that gives rise to one large OM1
branch. There is no plaque.

Other findings:

Normal pulmonary vein drainage into the left atrium.

Normal left atrial appendage without a thrombus.

Normal size of the pulmonary artery.
IMPRESSION: 1. Coronary calcium score of 0. This was 0 percentile for age and
sex matched control.

2. Normal coronary origin with right dominance.

3. No evidence of CAD.

Biscoito Laurentina, DO

*** End of Addendum ***
EXAM:
OVER-READ INTERPRETATION  CT CHEST

The following report is an over-read performed by radiologist Dr.
Benrabah Etoil [REDACTED] on 12/29/2019. This over-read
does not include interpretation of cardiac or coronary anatomy or
pathology. The coronary CTA interpretation by the cardiologist is
attached.
FINDINGS: Vascular: Heart is normal size.  Visualized aorta normal caliber.

Mediastinum/Nodes: No adenopathy.

Lungs/Pleura: Visualized lungs clear.  No effusions.

Upper Abdomen: Imaging into the upper abdomen shows no acute
findings.

Musculoskeletal: Chest wall soft tissues are unremarkable. No acute
bony abnormality.
IMPRESSION: No acute or significant extracardiac abnormality.

## 2021-03-20 ENCOUNTER — Ambulatory Visit (HOSPITAL_COMMUNITY)
Admission: RE | Admit: 2021-03-20 | Discharge: 2021-03-20 | Disposition: A | Discharge status: Home / Self Care | Payer: Medicaid Other | Source: Ambulatory Visit | Attending: Acute Care | Admitting: Acute Care

## 2021-03-20 ENCOUNTER — Other Ambulatory Visit: Payer: Self-pay

## 2021-03-20 DIAGNOSIS — Z122 Encounter for screening for malignant neoplasm of respiratory organs: Secondary | ICD-10-CM | POA: Diagnosis present

## 2021-03-20 DIAGNOSIS — F17200 Nicotine dependence, unspecified, uncomplicated: Secondary | ICD-10-CM | POA: Insufficient documentation

## 2021-03-29 ENCOUNTER — Other Ambulatory Visit: Payer: Self-pay | Admitting: Acute Care

## 2021-03-29 DIAGNOSIS — F17210 Nicotine dependence, cigarettes, uncomplicated: Secondary | ICD-10-CM

## 2021-04-16 HISTORY — PX: COLONOSCOPY: SHX174

## 2021-07-25 ENCOUNTER — Encounter (HOSPITAL_COMMUNITY): Payer: Self-pay | Admitting: *Deleted

## 2021-07-25 ENCOUNTER — Emergency Department (HOSPITAL_COMMUNITY)
Admission: EM | Admit: 2021-07-25 | Discharge: 2021-07-25 | Disposition: A | Discharge status: Home / Self Care | Payer: Medicaid Other | Attending: Emergency Medicine | Admitting: Emergency Medicine

## 2021-07-25 DIAGNOSIS — Z79899 Other long term (current) drug therapy: Secondary | ICD-10-CM | POA: Diagnosis not present

## 2021-07-25 DIAGNOSIS — I10 Essential (primary) hypertension: Secondary | ICD-10-CM | POA: Insufficient documentation

## 2021-07-25 DIAGNOSIS — J449 Chronic obstructive pulmonary disease, unspecified: Secondary | ICD-10-CM | POA: Diagnosis not present

## 2021-07-25 DIAGNOSIS — K922 Gastrointestinal hemorrhage, unspecified: Secondary | ICD-10-CM | POA: Diagnosis not present

## 2021-07-25 DIAGNOSIS — Z7951 Long term (current) use of inhaled steroids: Secondary | ICD-10-CM | POA: Insufficient documentation

## 2021-07-25 DIAGNOSIS — K625 Hemorrhage of anus and rectum: Secondary | ICD-10-CM | POA: Diagnosis present

## 2021-07-25 LAB — CBC WITH DIFFERENTIAL/PLATELET
Abs Immature Granulocytes: 0.04 10*3/uL (ref 0.00–0.07)
Basophils Absolute: 0.1 10*3/uL (ref 0.0–0.1)
Basophils Relative: 1 %
Eosinophils Absolute: 0.1 10*3/uL (ref 0.0–0.5)
Eosinophils Relative: 2 %
HCT: 47.2 % (ref 39.0–52.0)
Hemoglobin: 16.3 g/dL (ref 13.0–17.0)
Immature Granulocytes: 1 %
Lymphocytes Relative: 20 %
Lymphs Abs: 1.7 10*3/uL (ref 0.7–4.0)
MCH: 31.5 pg (ref 26.0–34.0)
MCHC: 34.5 g/dL (ref 30.0–36.0)
MCV: 91.1 fL (ref 80.0–100.0)
Monocytes Absolute: 0.7 10*3/uL (ref 0.1–1.0)
Monocytes Relative: 9 %
Neutro Abs: 5.9 10*3/uL (ref 1.7–7.7)
Neutrophils Relative %: 67 %
Platelets: 217 10*3/uL (ref 150–400)
RBC: 5.18 MIL/uL (ref 4.22–5.81)
RDW: 12.9 % (ref 11.5–15.5)
WBC: 8.6 10*3/uL (ref 4.0–10.5)
nRBC: 0 % (ref 0.0–0.2)

## 2021-07-25 LAB — COMPREHENSIVE METABOLIC PANEL
ALT: 44 U/L (ref 0–44)
AST: 26 U/L (ref 15–41)
Albumin: 4.4 g/dL (ref 3.5–5.0)
Alkaline Phosphatase: 48 U/L (ref 38–126)
Anion gap: 10 (ref 5–15)
BUN: 16 mg/dL (ref 6–20)
CO2: 21 mmol/L — ABNORMAL LOW (ref 22–32)
Calcium: 9.3 mg/dL (ref 8.9–10.3)
Chloride: 101 mmol/L (ref 98–111)
Creatinine, Ser: 0.75 mg/dL (ref 0.61–1.24)
GFR, Estimated: >60 mL/min (ref >60)
Glucose, Bld: 97 mg/dL (ref 70–99)
Potassium: 3.5 mmol/L (ref 3.5–5.1)
Sodium: 132 mmol/L — ABNORMAL LOW (ref 135–145)
Total Bilirubin: 0.9 mg/dL (ref 0.3–1.2)
Total Protein: 7.4 g/dL (ref 6.5–8.1)

## 2021-07-25 LAB — TYPE AND SCREEN
ABO/RH(D): O POS
Antibody Screen: NEGATIVE

## 2021-07-25 LAB — POC OCCULT BLOOD, ED: Fecal Occult Bld: POSITIVE — AB

## 2021-07-25 NOTE — Discharge Instructions (Addendum)
You came to the Emergency Department today to be evaluated for your rectal bleeding.  Your physical exam and lab work were reassuring.  You will need to follow-up with the gastroenterologist Dr. Jenetta Downer in the outpatient setting.  Please call his office tomorrow to schedule a follow-up appointment.  Additionally you will need to call your primary care provider Dr. Dema Severin and ask if you can hold your Plavix medication until you receive a colonoscopy for GI bleeding in the outpatient setting.    Get help right away if: Your bleeding does not stop. You feel light-headed or you faint. You feel weak. You have severe cramps in your back or abdomen. You pass large blood clots in your stool. Your symptoms are getting worse. You have chest pain or fast heartbeats.

## 2021-07-25 NOTE — ED Triage Notes (Signed)
Rectal bleeding x 4 days 

## 2021-07-25 NOTE — ED Provider Notes (Signed)
[facility] EMERGENCY DEPARTMENT Provider Note   CSN: [id number] Arrival date & time: 07/25/21  1708     History  Chief Complaint  Patient presents with   Rectal Bleeding    Ian [name] is a 61 y.o. male with past medical history of hypertension, COPD, neuropathy, anxiety, CVA (on Plavix).  Presents to the emergency department with a complaint of rectal bleeding.  Patient reports that he has been having hematochezia since Monday.  Patient reports multiple episodes of bright red blood coming from his rectum.  Patient states that the last 2 times he has gone to the bathroom this evening he has not noted any blood coming from his rectum.  Denies any fever, chills, chest pain, shortness of breath, abdominal distention, melena, constipation, diarrhea, pain with defecation, dysuria, hematuria, urinary urgency, lightheadedness, syncope.   Rectal Bleeding Associated symptoms: no abdominal pain, no dizziness, no fever, no light-headedness and no vomiting       Home Medications Prior to Admission medications   Medication Sig Start Date End Date Taking? Authorizing Provider  albuterol (VENTOLIN HFA) 108 (90 Base) MCG/ACT inhaler Inhale 2 puffs into the lungs every 6 (six) hours as needed for wheezing or shortness of breath. 02/20/21   Icard, Octavio Graves, DO  ALPRAZolam Duanne Moron) 1 MG tablet Take 1 mg by mouth 3 (three) times daily as needed for anxiety.     [provider]  atorvastatin (LIPITOR) 40 MG tablet Take 1 tablet (40 mg total) by mouth daily. 11/26/20   Minus Breeding, MD  Budeson-Glycopyrrol-Formoterol (BREZTRI AEROSPHERE) 160-9-4.8 MCG/ACT AERO Inhale 2 puffs into the lungs in the morning and at bedtime. 02/20/21   Icard, Octavio Graves, DO  Budeson-Glycopyrrol-Formoterol (BREZTRI AEROSPHERE) 160-9-4.8 MCG/ACT AERO Inhale 2 puffs into the lungs in the morning and at bedtime. 02/20/21   Garner Nash, DO  Choline Fenofibrate 135 MG capsule Take 135 mg by mouth daily.  08/23/19    [provider]  clopidogrel (PLAVIX) 75 MG tablet Take 1 tablet (75 mg total) by mouth daily. 11/09/14   Regalado, Belkys A, MD  gabapentin (NEURONTIN) 400 MG capsule Take 400 mg by mouth 2 (two) times daily. 02/21/20   [provider]  lisinopril (ZESTRIL) 10 MG tablet Take by mouth. 08/23/19 02/22/20  [provider]  pantoprazole (PROTONIX) 40 MG tablet Take 40 mg by mouth daily.    [provider]  tamsulosin (FLOMAX) 0.4 MG CAPS capsule Take 0.4 mg by mouth daily.  08/23/19   [provider]  Vitamin D, Ergocalciferol, (DRISDOL) 50000 UNITS CAPS capsule TAKE ONE CAPSULE BY MOUTH WEEKLY 01/31/14   [provider]      Allergies    Asa [aspirin], Ibuprofen, and Tylenol [acetaminophen]    Review of Systems   Review of Systems  Constitutional:  Negative for chills and fever.  Eyes:  Negative for visual disturbance.  Respiratory:  Negative for shortness of breath.   Cardiovascular:  Negative for chest pain.  Gastrointestinal:  Positive for anal bleeding and hematochezia. Negative for abdominal distention, abdominal pain, blood in stool, constipation, diarrhea, nausea, rectal pain and vomiting.  Genitourinary:  Negative for difficulty urinating and dysuria.  Musculoskeletal:  Negative for back pain and neck pain.  Skin:  Negative for color change and rash.  Neurological:  Negative for dizziness, syncope, light-headedness and headaches.  Psychiatric/Behavioral:  Negative for confusion.    Physical Exam Updated Vital Signs BP 140/71 (BP Location: Right Arm)    Pulse 60  Temp 98 F (36.7 C) (Oral)    Resp 16    Ht 5' 9"  (1.753 m)    Wt 98 kg    SpO2 97%    BMI 31.91 kg/m  Physical Exam Vitals and nursing note reviewed. Chaperone present: Male nurse tech present as Producer, television/film/video.  Constitutional:      General: He is not in acute distress.    Appearance: He is not ill-appearing, toxic-appearing or diaphoretic.  HENT:     Head: Normocephalic.   Eyes:     General: No scleral icterus.       Right eye: No discharge.        Left eye: No discharge.  Cardiovascular:     Rate and Rhythm: Normal rate.  Pulmonary:     Effort: Pulmonary effort is normal.  Abdominal:     General: Abdomen is protuberant. Bowel sounds are normal. There is no distension. There are no signs of injury.     Palpations: Abdomen is soft. There is no mass or pulsatile mass.     Tenderness: There is no abdominal tenderness. There is no guarding or rebound.     Hernia: There is no hernia in the umbilical area or ventral area.  Genitourinary:    Rectum: Guaiac result positive. No mass, tenderness, anal fissure, external hemorrhoid or internal hemorrhoid. Normal anal tone.     Comments: No stool noted in rectal vault.  Patient does have maroon-colored blood noted in rectal vault. Skin:    General: Skin is warm and dry.  Neurological:     General: No focal deficit present.     Mental Status: He is alert.  Psychiatric:        Behavior: Behavior is cooperative.    ED Results / Procedures / Treatments   Labs (all labs ordered are listed, but only abnormal results are displayed) Labs Reviewed  COMPREHENSIVE METABOLIC PANEL - Abnormal; Notable for the following components:      Result Value   Sodium 132 (*)    CO2 21 (*)    All other components within normal limits  POC OCCULT BLOOD, ED - Abnormal; Notable for the following components:   Fecal Occult Bld POSITIVE (*)    All other components within normal limits  CBC WITH DIFFERENTIAL/PLATELET  TYPE AND SCREEN    EKG None  Radiology No results found.  Procedures Procedures    Medications Ordered in ED Medications - No data to display  ED Course/ Medical Decision Making/ A&P Clinical Course as of 07/26/21 0158  Thu Jul 25, 2021  1921 Spoke to on-call gastroenterologist Dr. Jenetta Downer who advised that the patient has a 1 g drop below his baseline hemoglobin or hemoglobin below 11 he would admit to  the hospitalist team.  If above is not met he could follow-up in outpatient setting for colonoscopy.  We will need to contact the provider who prescribes his Plavix and see if he can be held until he has colonoscopy in the outpatient setting. [PB]    Clinical Course User Index [PB] Loni Beckwith, PA-C                           Medical Decision Making Amount and/or Complexity of Data Reviewed Labs: ordered.   Alert 61 year old male in no acute distress, nontoxic-appearing.  Presents to the emergency department with a chief complaint of rectal bleeding.  Information was obtained from patient and patient's daughter at bedside.  Medical  records were reviewed including previous provider notes and previous lab results.  Patient's medical care was complicated by history of CVA on Plavix, hypertension, COPD.  The patient reports of rectal bleeding and blood noted on rectal exam we will obtain CBC, CMP, and type and screen.  We will consult with gastroenterologist for management of patient.  I personally spoke with on-call gastroenterologist Dr. Jenetta Downer who recommended that the patient has 1 g drop below his baseline hemoglobin or hemoglobin below 11 he should be admitted to the hospitalist team with GI consult.  If these factors are not met patient can be discharged to follow-up in outpatient setting.  Patient noted to contact provider who prescribes his Plavix to see if they can be held for colonoscopy if discharged to follow-up in outpatient setting.  I personally interpreted lab results.  Pertinent findings include: -CBC unremarkable with no anemia -CMP shows sodium 132 -Hemoccult positive  Serial right reexamination patient is in no acute distress.  Patient able to stand and ambulate without difficulty.  Continues to deny any lightheadedness or syncope.  Patient hemodynamically stable.  Admission was considered however with no anemia will discharge patient to follow-up with  gastroenterology in outpatient setting.  Social determinants of health including poor medical literacy affected patient's care.  Discussed results, findings, treatment and follow up. Patient and patient's daughter advised of return precautions. Patient and patient's daughter verbalized understanding and agreed with plan.  Patient care was discussed with attending physician Dr. Sabra Heck.         Final Clinical Impression(s) / ED Diagnoses Final diagnoses:  Acute GI bleeding    Rx / DC Orders ED Discharge Orders     None         Dyann Ruddle 07/26/21 Antony Madura    Noemi Chapel, MD 07/26/21 [phone]

## 2021-07-26 ENCOUNTER — Telehealth: Payer: Self-pay | Admitting: Internal Medicine

## 2021-07-26 NOTE — Telephone Encounter (Signed)
ER REFERRAL  

## 2021-08-11 NOTE — Progress Notes (Signed)
Primary Care Physician:  Jettie Booze, NP  Primary Gastroenterologist:  Elon Alas. Abbey Chatters, DO   Chief Complaint  Patient presents with   Rectal Bleeding    Follow up from hospital, no longer having any bleeding.   Constipation    HPI:  Ian [name] is a 61 y.o. male here for further evaluation of rectal bleeding. Seen in the ED 07/25/21 at [facility]. Reported multiple episodes of rectal bleeding. On rectal exam no stool noted, maroon-colored blood noted in rectal vault.   Patient seen in the ED February 9th with complaint of rectal bleeding.  Several episodes of bleeding are worse couple of days.  Rectal exam showed heme positive stool, no masses or tenderness.  No external or internal hemorrhoids noted.  No stool in the rectal vault.  Maroon-colored blood noted.  ED provider got in touch with Dr. Jenetta Downer who was on-call.  He advised that if patient had more than 1 g drop from his baseline or hemoglobin less than 11 he should be admitted, otherwise with follow-up as an outpatient for colonoscopy.  While in the emergency department his hemoglobin was 16.3.  Patient presents today for further evaluation.  Additional information now available, patient completed Colonoscopy 04/2021 at GI in [place], Dr. Tora Duck. He had 42mm rectal polyp removed.   States he had blood in his stool for a few days prior to being seen in the ED on February 9.  He has had no further bleeding.  Bowel movements have been regular.  Denies any straining or hard stools.  No rectal pain.  No abdominal pain, vomiting.  No heartburn on PPI.  Feels like it is hard to swallow at times but he relates this to a cyst in the back of his throat.  He has had chronic reflux for 8 to 9 years, has been on PPI for that time.  No prior upper endoscopy.  He saw ENT, Dr. Constance Holster in April 2022 for the cyst in the back of his throat but was not seen at the time.     Patient tells me has been off Plavix since he was in the ED per their  recommendation.  Plavix is for history of strokes. No ASA.    Current Outpatient Medications  Medication Sig Dispense Refill   ALPRAZolam (XANAX) 1 MG tablet Take 1 mg by mouth 3 (three) times daily as needed for anxiety.      atorvastatin (LIPITOR) 40 MG tablet Take 1 tablet (40 mg total) by mouth daily. 90 tablet 3   Budeson-Glycopyrrol-Formoterol (BREZTRI AEROSPHERE) 160-9-4.8 MCG/ACT AERO Inhale 2 puffs into the lungs in the morning and at bedtime. 5.9 g 0   clopidogrel (PLAVIX) 75 MG tablet Take 75 mg by mouth daily.     FENOFIBRATE PO Take 135 mg by mouth daily.     gabapentin (NEURONTIN) 600 MG tablet Take by mouth.     lisinopril (ZESTRIL) 10 MG tablet Take 10 mg by mouth daily.     pantoprazole (PROTONIX) 40 MG tablet Take 40 mg by mouth daily.     tamsulosin (FLOMAX) 0.4 MG CAPS capsule Take 0.4 mg by mouth daily.      Vitamin D, Ergocalciferol, (DRISDOL) 50000 UNITS CAPS capsule TAKE ONE CAPSULE BY MOUTH WEEKLY     No current facility-administered medications for this visit.    Allergies as of 08/12/2021 - Review Complete 08/12/2021  Allergen Reaction Noted   Asa [aspirin] Other (See Comments) 08/17/2013   Ibuprofen Other (See Comments)  03/05/2015   Tylenol [acetaminophen] Other (See Comments) 08/17/2013    Past Medical History:  Diagnosis Date   Hx of cardiovascular stress test    Lexiscan Myoview (09/2013):  No ischemia, EF 62%; NORMAL   Leg pain    ABIs (09/29/13):  Normal.   Stomach ulcer    Stroke ([place])    Tobacco abuse     Past Surgical History:  Procedure Laterality Date   ANKLE FRACTURE SURGERY     APPENDECTOMY      Family History  Problem Relation Age of Onset   Heart failure Mother 72       Alive   Diabetes Mother    CAD Father 54       Died of MI   Diabetes Sister    CAD Brother    Cancer Maternal Grandmother        Throat cancer    CAD Paternal Grandfather 86       Died of MI   Heart disease Daughter        Rapid heart rate   Colon cancer  Neg Hx     Social History   Socioeconomic History   Marital status: Divorced    Spouse name: Not on file   Number of children: 5   Years of education: Not on file   Highest education level: Not on file  Occupational History    Employer: NOT EMPLOYED  Tobacco Use   Smoking status: Every Day    Packs/day: 0.25    Years: 35.00    Pack years: 8.75    Types: Cigarettes   Smokeless tobacco: Never   Tobacco comments:    smokes 1 ppd 08/23/20    Smokes 5 cigarettes a day  Substance and Sexual Activity   Alcohol use: Not Currently    Alcohol/week: 9.0 - 13.0 standard drinks    Types: 1 Glasses of wine, 8 - 12 Cans of beer per week    Comment: Drank beer, for 35 years, quit 5 years ago    Drug use: No   Sexual activity: Not on file  Other Topics Concern   Not on file  Social History Narrative   Lives with alone.  Cannot read and write.     Social Determinants of Health   Financial Resource Strain: Not on file  Food Insecurity: Not on file  Transportation Needs: Not on file  Physical Activity: Not on file  Stress: Not on file  Social Connections: Not on file  Intimate Partner Violence: Not on file      ROS:  General: Negative for anorexia, weight loss, fever, chills, fatigue, weakness. Eyes: Negative for vision changes.  ENT: Negative for hoarseness, difficulty swallowing , nasal congestion. CV: Negative for chest pain, angina, palpitations, dyspnea on exertion, peripheral edema.  Respiratory: Negative for dyspnea at rest, dyspnea on exertion, positive cough, sputum, wheezing.  GI: See history of present illness. GU:  Negative for dysuria, hematuria, urinary incontinence, urinary frequency, nocturnal urination.  MS: Negative for joint pain, low back pain.  Derm: Negative for rash or itching.  Neuro: Negative for weakness, abnormal sensation, seizure, frequent headaches, memory loss, confusion.  Positive neuropathy Psych: Negative for anxiety, depression, suicidal  ideation, hallucinations.  Endo: Negative for unusual weight change.  Heme: Negative for bruising or bleeding. Allergy: Negative for rash or hives.    Physical Examination:  BP (!) 142/68 (BP Location: Right Arm, Cuff Size: Normal)    Pulse 81    Temp 97.8 F (  36.6 C)    Ht 5\' 9"  (1.753 m)    Wt 214 lb 9.6 oz (97.3 kg)    SpO2 94%    BMI 31.69 kg/m    General: Well-nourished, well-developed in no acute distress.  Difficult historian. Head: Normocephalic, atraumatic.   Eyes: Conjunctiva pink, no icterus. Mouth: pea sized cyst in right posterior pharynx near the uvula. Neck: Supple without thyromegaly, masses, or lymphadenopathy.  Lungs: Clear to auscultation bilaterally.  Heart: Regular rate and rhythm, no murmurs rubs or gallops.  Abdomen: Bowel sounds are normal, nontender, nondistended, no hepatosplenomegaly or masses, no abdominal bruits or    hernia , no rebound or guarding.  Positive rectus diastases. Rectal: not performed Extremities: No lower extremity edema. No clubbing or deformities.  Neuro: Alert and oriented x 4 , grossly normal neurologically.  Skin: Warm and dry, no rash or jaundice.   Psych: Alert and cooperative, normal mood and affect.  Labs: Lab Results  Component Value Date   CREATININE 0.75 07/25/2021   BUN 16 07/25/2021   NA 132 (L) 07/25/2021   K 3.5 07/25/2021   CL 101 07/25/2021   CO2 21 (L) 07/25/2021   Lab Results  Component Value Date   ALT 44 07/25/2021   AST 26 07/25/2021   ALKPHOS 48 07/25/2021   BILITOT 0.9 07/25/2021   Lab Results  Component Value Date   WBC 8.6 07/25/2021   HGB 16.3 07/25/2021   HCT 47.2 07/25/2021   MCV 91.1 07/25/2021   PLT 217 07/25/2021     Imaging Studies: No results found.   Assessment:  Rectal bleeding: Recent rectal bleeding as outlined.  Appears to be small-volume.  Hemoglobin 16.  No further bleeding noted.  Colonoscopy recently, October 2022 with 3 mm rectal polyp removed.  Suspect benign anorectal  source for bleeding.  Would encourage him to resume his Plavix due to history of strokes.  Monitor for further bleeding.  Chronic GERD: No prior upper endoscopy.  Has been on PPI for 8 to 9 years.  Complains of dysphagia which may be secondary to posterior pharyngeal cyst but would consider esophageal dilation at time of EGD if any significant findings.  Doubt recent bleeding related to upper GI source but can be evaluated at time of endoscopy.  Recommend EGD at this time to rule out Barrett's.  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  Patient has pea-sized cystic looking lesion just right of the uvula.  Arrange for patient to follow-up with ENT.   Plan: Resume Plavix.  Return for recurrent rectal bleeding. EGD plus or minus esophageal dilation with Dr. Abbey Chatters.  ASA 3.  Hold Plavix for 5 days.  I have discussed the risks, alternatives, benefits with regards to but not limited to the risk of reaction to medication, bleeding, infection, perforation and the patient is agreeable to proceed. Written consent to be obtained. Continue pantoprazole 40 mg daily. Return to ENT for cyst in the back of throat.

## 2021-08-12 ENCOUNTER — Other Ambulatory Visit: Payer: Self-pay

## 2021-08-12 ENCOUNTER — Ambulatory Visit: Payer: Medicaid Other | Admitting: Gastroenterology

## 2021-08-12 ENCOUNTER — Encounter: Payer: Self-pay | Admitting: Gastroenterology

## 2021-08-12 VITALS — BP 142/68 | HR 81 | Temp 97.8°F | Ht 69.0 in | Wt 214.6 lb

## 2021-08-12 DIAGNOSIS — K219 Gastro-esophageal reflux disease without esophagitis: Secondary | ICD-10-CM

## 2021-08-12 DIAGNOSIS — J392 Other diseases of pharynx: Secondary | ICD-10-CM | POA: Insufficient documentation

## 2021-08-12 DIAGNOSIS — K625 Hemorrhage of anus and rectum: Secondary | ICD-10-CM

## 2021-08-12 DIAGNOSIS — R1310 Dysphagia, unspecified: Secondary | ICD-10-CM | POA: Diagnosis not present

## 2021-08-12 NOTE — Patient Instructions (Addendum)
Start back on your clopidogrel (Plavix).  You will need to hold 5 days prior to upcoming endoscopy. Upper endoscopy as scheduled.  Please see separate instructions. Referral to follow up with his ear nose and throat regarding cyst in the back of your throat. Continue pantoprazole 40 mg daily. Monitor for recurrent blood in the stool, please let us know if you see any.

## 2021-08-19 ENCOUNTER — Encounter: Payer: Self-pay | Admitting: Pulmonary Disease

## 2021-08-19 ENCOUNTER — Ambulatory Visit: Payer: Medicaid Other | Admitting: Pulmonary Disease

## 2021-08-19 ENCOUNTER — Other Ambulatory Visit: Payer: Self-pay

## 2021-08-19 VITALS — BP 126/66 | HR 73 | Temp 97.6°F | Ht 69.0 in | Wt 213.2 lb

## 2021-08-19 DIAGNOSIS — Z716 Tobacco abuse counseling: Secondary | ICD-10-CM

## 2021-08-19 DIAGNOSIS — F17200 Nicotine dependence, unspecified, uncomplicated: Secondary | ICD-10-CM

## 2021-08-19 DIAGNOSIS — J449 Chronic obstructive pulmonary disease, unspecified: Secondary | ICD-10-CM | POA: Diagnosis not present

## 2021-08-19 DIAGNOSIS — Z01818 Encounter for other preprocedural examination: Secondary | ICD-10-CM | POA: Diagnosis not present

## 2021-08-19 DIAGNOSIS — Z720 Tobacco use: Secondary | ICD-10-CM | POA: Diagnosis not present

## 2021-08-19 NOTE — Patient Instructions (Signed)
Thank you for visiting Dr. Tonia Brooms at [facility]. ?Today we recommend the following: ? ?Ok for planned endoscopy with GI  ?Continue BREZTRI  ?Continue Albuterol  ? ?Return in about 7 months (around 03/21/2022) for with Kandice Robinsons, NP, or Dr. Tonia Brooms. ? ? ? ?Please do your part to reduce the spread of COVID-19.  ? ?You must quit smoking or vaping. This is the single most important thing that you can do to improve your lung health.  ? ?S = Set a quit date. ?T = Tell family, friends, and the people around you that you plan to quit. ?A = Anticipate or plan ahead for the tough times you'll face while quitting. ?R = Remove cigarettes and other tobacco products from your home, car, and work ?T = Talk to [place] about getting help to quit ? ?If you need help feel free to reach out to our office, [facility] Smoking Cessation Class: [phone], call 1-800-QUIT-NOW, or visit www.[email or web address].be. ? ? ?  ?

## 2021-08-19 NOTE — Progress Notes (Signed)
Synopsis: Referred in August 2019 for wheezing/SOB by Jettie Booze, NP  Subjective:   PATIENT ID: Ian [name] GENDER: male DOB: 1960-08-27, MRN: [id number]  Chief Complaint  Patient presents with   Follow-up    Follow up. Patient has no complaints.    Initial office visit: PMH of tobacco abuse and CVA. He was recently started on an inhaler, twice in the morning and twice at night. He is still having trouble breathing.   He has been smoking since 61 years old, and by teenager he was smoking regularly. He is currently down to a pack per day. At his max he was smoking 2-3 packs per day, >50 pack year history. He has never had PFTs before or seen a pulmonologist. His biggest trouble with his breathing is with exertion. He occasionally has chest tightness with exertion. He admits to chest pain behind his sternum with climbing up a hill.  His chest pain is associated with shortness of breath.  His shortness of breath is also associated with cough in the morning and cough at night.  He does have mild amount of sputum production that is usually whitish in color.  He wheezes on occasion.  After being started on his inhaler Symbicort 2 puffs twice daily he does feel better.  [place] father with MI, grandfather with MI and brother with CAD s/p CABGX5.   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  OV 03/12/2018: He is doing some better. He is still smoking. He had PFTs completed today, appears to have a mixed restrictive and obstructive defect. He feels like the inhalers are working. He still has dyspnea on exertion.  He does have breathlessness with climbing stairs or climbing he will.  He is cough may be some better.  He does wake up in the morning with sputum production which is yellowish in color.  He denies hemoptysis.  I reviewed recent CT imaging with the patient.  There is evidence of centrilobular  emphysema.  No found etiology of his weight loss.  The patient does admit that he difficulty with reading.  And has struggled with his medications at home and being able to read and interpret the instructions on the bottles inhalers.  He believes that he is doing them correctly however.  OV 01/27/2019: Patient here today for follow-up of COPD.  Last seen by me back in September.  Otherwise doing well.  Unfortunately has been unable to purchase his medications due to cost.  Patient also has literacy problems and has much difficulty understanding when to use his medications.  We will try to simplify his regimen today.  He is very concerned that he could develop lung cancer at some point.  Unfortunately he is still smoking around 5 cigarettes/day.  He knows that he needs to quit but he is having much difficulty with that as well.  Otherwise he continues to have dyspnea on exertion and shortness of breath.  Occasional wheezing.  He also carries a cup around with him for the amount of chronic sputum that he brings up.  Denies fevers.  But states for the past several weeks he has been feeling much worse.  OV 02/20/2021: Patient here today for follow-up regarding COPD.  Unfortunately [name] is still smoking.  He has been smoking for a long time he was able to cut down to 5 cigarettes a day at the last time we  saw him in 2020.  At this point he still smoking about a pack to a pack and 1/2/day.  He is ran out of all of his medications.  He has not been using any inhalers.  He does have a rescue inhaler that he uses on occasion.  Complains of chest tightness and shortness of breath.  Does have daily cough and sputum production.  OV 08/19/2021: Patient here today for follow-up regarding COPD.  Unfortunately he is still smoking.  Still smoking about 5 cigarettes/day.  Story very similar to when we met back in September.  He knows that he needs to quit smoking but its been very difficult for him.  From a respiratory  standpoint he is able to maintain.  He does feel like he has new triple therapy inhaler regimen with Judithann Sauger is much more effective than what he was on in the past.  He still gets up in the morning with daily cough and sputum production throughout the day is able to do fine until at nighttime also has cough that returns.   Past Medical History:  Diagnosis Date   Hx of cardiovascular stress test    Lexiscan Myoview (09/2013):  No ischemia, EF 62%; NORMAL   Leg pain    ABIs (09/29/13):  Normal.   Stomach ulcer    Stroke ([place])    Tobacco abuse      Family History  Problem Relation Age of Onset   Heart failure Mother 13       Alive   Diabetes Mother    CAD Father 63       Died of MI   Diabetes Sister    CAD Brother    Cancer Maternal Grandmother        Throat cancer    CAD Paternal Grandfather 95       Died of MI   Heart disease Daughter        Rapid heart rate   Colon cancer Neg Hx      Social History   Socioeconomic History   Marital status: Divorced    Spouse name: Not on file   Number of children: 5   Years of education: Not on file   Highest education level: Not on file  Occupational History    Employer: NOT EMPLOYED  Tobacco Use   Smoking status: Every Day    Packs/day: 0.25    Years: 35.00    Pack years: 8.75    Types: Cigarettes   Smokeless tobacco: Never   Tobacco comments:    smokes 1 ppd 08/23/20    Smokes 5 cigarettes a day  Substance and Sexual Activity   Alcohol use: Not Currently    Alcohol/week: 9.0 - 13.0 standard drinks    Types: 1 Glasses of wine, 8 - 12 Cans of beer per week    Comment: Drank beer, for 35 years, quit 5 years ago    Drug use: No   Sexual activity: Not on file  Other Topics Concern   Not on file  Social History Narrative   Lives with alone.  Cannot read and write.     Social Determinants of Health   Financial Resource Strain: Not on file  Food Insecurity: Not on file  Transportation Needs: Not on file  Physical Activity:  Not on file  Stress: Not on file  Social Connections: Not on file  Intimate Partner Violence: Not on file     Allergies  Allergen Reactions   Asa [Aspirin] Other (  See Comments)   Ibuprofen Other (See Comments)   Tylenol [Acetaminophen] Other (See Comments)     Outpatient Medications Prior to Visit  Medication Sig Dispense Refill   ALPRAZolam (XANAX) 1 MG tablet Take 1 mg by mouth 3 (three) times daily as needed for anxiety.      atorvastatin (LIPITOR) 40 MG tablet Take 1 tablet (40 mg total) by mouth daily. 90 tablet 3   Budeson-Glycopyrrol-Formoterol (BREZTRI AEROSPHERE) 160-9-4.8 MCG/ACT AERO Inhale 2 puffs into the lungs in the morning and at bedtime. 5.9 g 0   clopidogrel (PLAVIX) 75 MG tablet Take 75 mg by mouth daily.     FENOFIBRATE PO Take 135 mg by mouth daily.     gabapentin (NEURONTIN) 600 MG tablet Take by mouth.     pantoprazole (PROTONIX) 40 MG tablet Take 40 mg by mouth daily.     tamsulosin (FLOMAX) 0.4 MG CAPS capsule Take 0.4 mg by mouth daily.      Vitamin D, Ergocalciferol, (DRISDOL) 50000 UNITS CAPS capsule TAKE ONE CAPSULE BY MOUTH WEEKLY     lisinopril (ZESTRIL) 10 MG tablet Take 10 mg by mouth daily.     No facility-administered medications prior to visit.    Review of Systems  Constitutional:  Negative for chills, fever, malaise/fatigue and weight loss.  HENT:  Negative for hearing loss, sore throat and tinnitus.   Eyes:  Negative for blurred vision and double vision.  Respiratory:  Positive for cough and sputum production. Negative for hemoptysis, shortness of breath, wheezing and stridor.   Cardiovascular:  Negative for chest pain, palpitations, orthopnea, leg swelling and PND.  Gastrointestinal:  Negative for abdominal pain, constipation, diarrhea, heartburn, nausea and vomiting.  Genitourinary:  Negative for dysuria, hematuria and urgency.  Musculoskeletal:  Negative for joint pain and myalgias.  Skin:  Negative for itching and rash.  Neurological:   Negative for dizziness, tingling, weakness and headaches.  Endo/Heme/Allergies:  Negative for environmental allergies. Does not bruise/bleed easily.  Psychiatric/Behavioral:  Negative for depression. The patient is not nervous/anxious and does not have insomnia.   All other systems reviewed and are negative.   Objective:  Physical Exam Vitals reviewed.  Constitutional:      General: He is not in acute distress.    Appearance: He is well-developed. He is obese.  HENT:     Head: Normocephalic and atraumatic.  Eyes:     General: No scleral icterus.    Conjunctiva/sclera: Conjunctivae normal.     Pupils: Pupils are equal, round, and reactive to light.  Neck:     Vascular: No JVD.     Trachea: No tracheal deviation.  Cardiovascular:     Rate and Rhythm: Normal rate and regular rhythm.     Heart sounds: Normal heart sounds. No murmur heard. Pulmonary:     Effort: Pulmonary effort is normal. No tachypnea, accessory muscle usage or respiratory distress.     Breath sounds: No stridor. No wheezing, rhonchi or rales.     Comments: Diminished breath sounds bilaterally Abdominal:     General: There is no distension.     Palpations: Abdomen is soft.     Tenderness: There is no abdominal tenderness.  Musculoskeletal:        General: No tenderness.     Cervical back: Neck supple.  Lymphadenopathy:     Cervical: No cervical adenopathy.  Skin:    General: Skin is warm and dry.     Capillary Refill: Capillary refill takes less than 2 seconds.  Findings: No rash.  Neurological:     Mental Status: He is alert and oriented to person, place, and time.  Psychiatric:        Behavior: Behavior normal.     Vitals:   08/19/21 0901  BP: 126/66  Pulse: 73  Temp: 97.6 F (36.4 C)  TempSrc: Oral  SpO2: 96%  Weight: 213 lb 3.2 oz (96.7 kg)  Height: 5' 9"  (1.753 m)   96% on RA BMI Readings from Last 3 Encounters:  08/19/21 31.48 kg/m  08/12/21 31.69 kg/m  07/25/21 31.91 kg/m   Wt  Readings from Last 3 Encounters:  08/19/21 213 lb 3.2 oz (96.7 kg)  08/12/21 214 lb 9.6 oz (97.3 kg)  07/25/21 216 lb 0.8 oz (98 kg)    CBC    Component Value Date/Time   WBC 8.6 07/25/2021 1909   RBC 5.18 07/25/2021 1909   HGB 16.3 07/25/2021 1909   HCT 47.2 07/25/2021 1909   PLT 217 07/25/2021 1909   MCV 91.1 07/25/2021 1909   MCH 31.5 07/25/2021 1909   MCHC 34.5 07/25/2021 1909   RDW 12.9 07/25/2021 1909   LYMPHSABS 1.7 07/25/2021 1909   MONOABS 0.7 07/25/2021 1909   EOSABS 0.1 07/25/2021 1909   BASOSABS 0.1 07/25/2021 1909    Chest Imaging:  11/07/2014 IMPRESSION: No active cardiopulmonary disease.  02/18/2018: CT chest No acute cardiopulmonary disease, evidence of centrilobular emphysema The patient's images have been independently reviewed by me.    07/06/2019 lung cancer screening CT: Lung RADS 1, no concerning lesion. The patient's images have been independently reviewed by me.    Pulmonary Functions Testing Results: 03/12/2018: Postbronchodilator responses recorded FVC 3.12 L 71% predicted FEV1 2.4 L 71% predicted, 20% bronchodilator response Ratio 77 SVC 69% TLC 68% RV/TLC ratio 125% ERV 16% DLCO 59%  PFT Results Latest Ref Rng & Units 03/12/2018  FVC-Pre L 2.68  FVC-Predicted Pre % 61  FVC-Post L 3.12  FVC-Predicted Post % 71  Pre FEV1/FVC % % 74  Post FEV1/FCV % % 77  FEV1-Pre L 1.99  FEV1-Predicted Pre % 59  FEV1-Post L 2.40  DLCO uncorrected ml/min/mmHg 16.81  DLCO [facility]% % 59  DLVA Predicted % 79     FeNO: None   Pathology: None   Echocardiogram:   Study Conclusions - Left ventricle: Technically difficult study. The cavity size was   normal. Wall thickness was increased in a pattern of mild LVH.   The estimated ejection fraction was 60%. Wall motion was normal;   there were no regional wall motion abnormalities. - Right ventricle: The cavity size was normal. Systolic function   was normal. - Impressions: No cardiac source of embolism  was identified, but   cannot be ruled out on the basis of this examination. Impressions: - No cardiac source of embolism was identified, but cannot be ruled   out on the basis of this examination.  Heart Catheterization: None     Assessment & Plan:     ICD-10-CM   1. Chronic obstructive pulmonary disease, unspecified COPD type ([place])  J44.9     2. Tobacco abuse  Z72.0     3. Current smoker  F17.200     4. Encounter for tobacco use cessation counseling  Z71.6     5. Pre-op evaluation  Z01.818        Discussion:  This is a 61 year old gentleman, current smoker, PFTs with a mixed restrictive and obstructive defect, evidence of air trapping, mildly reduced DLCO, ever CT  radiographic evidence of centrilobular emphysema.  Plan: Continue triple therapy inhaler regimen Continue albuterol for shortness of breath and wheezing. Continue enrollment in our lung cancer screening program, next CT scheduled in October 2023. As for his planned upcoming endoscopy I think that he is low risk for perioperative pulmonary related complications. If he does have any respiratory trouble coming out of anesthesia he could easily be supported with BiPAP however I do suspect he will do fine he does not have significant obstructive disease. I have also counseled the patient on smoking cessation. I think this is the single most important thing that he can do to improve his overall health.    Current Outpatient Medications:    ALPRAZolam (XANAX) 1 MG tablet, Take 1 mg by mouth 3 (three) times daily as needed for anxiety. , Disp: , Rfl:    atorvastatin (LIPITOR) 40 MG tablet, Take 1 tablet (40 mg total) by mouth daily., Disp: 90 tablet, Rfl: 3   Budeson-Glycopyrrol-Formoterol (BREZTRI AEROSPHERE) 160-9-4.8 MCG/ACT AERO, Inhale 2 puffs into the lungs in the morning and at bedtime., Disp: 5.9 g, Rfl: 0   clopidogrel (PLAVIX) 75 MG tablet, Take 75 mg by mouth daily., Disp: , Rfl:    FENOFIBRATE PO, Take 135  mg by mouth daily., Disp: , Rfl:    gabapentin (NEURONTIN) 600 MG tablet, Take by mouth., Disp: , Rfl:    pantoprazole (PROTONIX) 40 MG tablet, Take 40 mg by mouth daily., Disp: , Rfl:    tamsulosin (FLOMAX) 0.4 MG CAPS capsule, Take 0.4 mg by mouth daily. , Disp: , Rfl:    Vitamin D, Ergocalciferol, (DRISDOL) 50000 UNITS CAPS capsule, TAKE ONE CAPSULE BY MOUTH WEEKLY, Disp: , Rfl:    lisinopril (ZESTRIL) 10 MG tablet, Take 10 mg by mouth daily., Disp: , Rfl:    Garner Nash, DO [facility] 08/19/2021 9:06 AM

## 2021-09-02 NOTE — Patient Instructions (Signed)
? ? ? ? ? ? ? ? Zara Chess ? 09/02/2021  ?  ? @PREFPERIOPPHARMACY @ ? ? Your procedure is scheduled on  09/09/2021. ? ? Report to 09/11/2021 at  0800  A.M. ? ? Call this number if you have problems the morning of surgery: ? [phone] ? ? Remember: ? Follow the diet instructions given to you by the office. ? ?Use your inhalers before you come and bring your rescue inhaler with you. ?  ? Take these medicines the morning of surgery with A SIP OF WATER  ? ?xanax(if needed), gabapentin, protonix, flomax. ?  ? Do not wear jewelry, make-up or nail polish. ? Do not wear lotions, powders, or perfumes, or deodorant. ? Do not shave 48 hours prior to surgery.  Men may shave face and neck. ? Do not bring valuables to the hospital. ? [facility] is not responsible for any belongings or valuables. ? ?Contacts, dentures or bridgework may not be worn into surgery.  Leave your suitcase in the car.  After surgery it may be brought to your room. ? ?For patients admitted to the hospital, discharge time will be determined by your treatment team. ? ?Patients discharged the day of surgery will not be allowed to drive home and must have someone with them for 24 hours.  ? ? ?Special instructions:   DO NOT smoke tobacco or vape for 24 hours before your procedure. ? ?Please read over the following fact sheets that you were given. ?Anesthesia Post-op Instructions and Care and Recovery After Surgery ?  ? ? ? Upper Endoscopy, Adult, Care After ?This sheet gives you information about how to care for yourself after your procedure. Your health care provider may also give you more specific instructions. If you have problems or questions, contact your health care provider. ?What can I expect after the procedure? ?After the procedure, it is common to have: ?A sore throat. ?Mild stomach pain or discomfort. ?Bloating. ?Nausea. ?Follow these instructions at home: ? ?Follow instructions from your health care provider about what to eat or drink after  your procedure. ?Return to your normal activities as told by your health care provider. Ask your health care provider what activities are safe for you. ?Take over-the-counter and prescription medicines only as told by your health care provider. ?If you were given a sedative during the procedure, it can affect you for several hours. Do not drive or operate machinery until your health care provider says that it is safe. ?Keep all follow-up visits as told by your health care provider. This is important. ?Contact a health care provider if you have: ?A sore throat that lasts longer than one day. ?Trouble swallowing. ?Get help right away if: ?You vomit blood or your vomit looks like coffee grounds. ?You have: ?A fever. ?Bloody, black, or tarry stools. ?A severe sore throat or you cannot swallow. ?Difficulty breathing. ?Severe pain in your chest or abdomen. ?Summary ?After the procedure, it is common to have a sore throat, mild stomach discomfort, bloating, and nausea. ?If you were given a sedative during the procedure, it can affect you for several hours. Do not drive or operate machinery until your health care provider says that it is safe. ?Follow instructions from your health care provider about what to eat or drink after your procedure. ?Return to your normal activities as told by your health care provider. ?This information is not intended to replace advice given to you by your health care provider. Make sure you discuss  any questions you have with your health care provider. ?Document Revised: 04/08/2019 Document Reviewed: 11/02/2017 ?Elsevier Patient Education ? 2022 [employer] ?Esophageal Dilatation ?Esophageal dilatation, also called esophageal dilation, is a procedure to widen or open a blocked or narrowed part of the esophagus. The esophagus is the part of the body that moves food and liquid from the mouth to the stomach. You may need this procedure if: ?You have a buildup of scar tissue in your esophagus  that makes it difficult, painful, or impossible to swallow. This can be caused by gastroesophageal reflux disease (GERD). ?You have cancer of the esophagus. ?There is a problem with how food moves through your esophagus. ?In some cases, you may need this procedure repeated at a later time to dilate the esophagus gradually. ?Tell a health care provider about: ?Any allergies you have. ?All medicines you are taking, including vitamins, herbs, eye drops, creams, and over-the-counter medicines. ?Any problems you or family members have had with anesthetic medicines. ?Any blood disorders you have. ?Any surgeries you have had. ?Any medical conditions you have. ?Any antibiotic medicines you are required to take before dental procedures. ?Whether you are pregnant or may be pregnant. ?What are the risks? ?Generally, this is a safe procedure. However, problems may occur, including: ?Bleeding due to a tear in the lining of the esophagus. ?A hole, or perforation, in the esophagus. ?What happens before the procedure? ?Ask your health care provider about: ?Changing or stopping your regular medicines. This is especially important if you are taking diabetes medicines or blood thinners. ?Taking medicines such as aspirin and ibuprofen. These medicines can thin your blood. Do not take these medicines unless your health care provider tells you to take them. ?Taking over-the-counter medicines, vitamins, herbs, and supplements. ?Follow instructions from your health care provider about eating or drinking restrictions. ?Plan to have a responsible adult take you home from the hospital or clinic. ?Plan to have a responsible adult care for you for the time you are told after you leave the hospital or clinic. This is important. ?What happens during the procedure? ?You may be given a medicine to help you relax (sedative). ?A numbing medicine may be sprayed into the back of your throat, or you may gargle the medicine. ?Your health care provider  may perform the dilatation using various surgical instruments, such as: ?Simple dilators. This instrument is carefully placed in the esophagus to stretch it. ?Guided wire bougies. This involves using an endoscope to insert a wire into the esophagus. A dilator is passed over this wire to enlarge the esophagus. Then the wire is removed. ?Balloon dilators. An endoscope with a small balloon is inserted into the esophagus. The balloon is inflated to stretch the esophagus and open it up. ?The procedure may vary among health care providers and hospitals. ?What can I expect after the procedure? ?Your blood pressure, heart rate, breathing rate, and blood oxygen level will be monitored until you leave the hospital or clinic. ?Your throat may feel slightly sore and numb. This will get better over time. ?You will not be allowed to eat or drink until your throat is no longer numb. ?When you are able to drink, urinate, and sit on the edge of the bed without nausea or dizziness, you may be able to return home. ?Follow these instructions at home: ?Take over-the-counter and prescription medicines only as told by your health care provider. ?If you were given a sedative during the procedure, it can affect you for several hours. Do not  drive or operate machinery until your health care provider says that it is safe. ?Plan to have a responsible adult care for you for the time you are told. This is important. ?Follow instructions from your health care provider about any eating or drinking restrictions. ?Do not use any products that contain nicotine or tobacco, such as cigarettes, e-cigarettes, and chewing tobacco. If you need help quitting, ask your health care provider. ?Keep all follow-up visits. This is important. ?Contact a health care provider if: ?You have a fever. ?You have pain that is not relieved by medicine. ?Get help right away if: ?You have chest pain. ?You have trouble breathing. ?You have trouble swallowing. ?You vomit  blood. ?You have black, tarry, or bloody stools. ?These symptoms may represent a serious problem that is an emergency. Do not wait to see if the symptoms will go away. Get medical help right away. Call your lo

## 2021-09-05 ENCOUNTER — Encounter (HOSPITAL_COMMUNITY)
Admission: RE | Admit: 2021-09-05 | Discharge: 2021-09-05 | Disposition: A | Discharge status: Home / Self Care | Payer: Medicaid Other | Source: Ambulatory Visit | Attending: Internal Medicine | Admitting: Internal Medicine

## 2021-09-05 ENCOUNTER — Encounter (HOSPITAL_COMMUNITY): Payer: Self-pay

## 2021-09-05 ENCOUNTER — Telehealth: Payer: Self-pay | Admitting: *Deleted

## 2021-09-05 DIAGNOSIS — Z01810 Encounter for preprocedural cardiovascular examination: Secondary | ICD-10-CM | POA: Diagnosis present

## 2021-09-05 HISTORY — DX: Depression, unspecified: F32.A

## 2021-09-05 HISTORY — DX: Anxiety disorder, unspecified: F41.9

## 2021-09-05 HISTORY — DX: Chronic obstructive pulmonary disease, unspecified: J44.9

## 2021-09-05 NOTE — Telephone Encounter (Signed)
-----   Message from Jethro Bolus, RN sent at 09/05/2021  9:29 AM EDT ----- ?Regarding: Plavix ?Patient didn't stop his Plavix until 3/23 but he should have stopped it on 3/21.  Can we still proceed ? ?

## 2021-09-05 NOTE — Telephone Encounter (Signed)
Per Dr. Abbey Chatters okay to still proceed. Message sent to [place] her. ? ?Called pt, left detailed VM advising of the same. ?

## 2021-09-05 NOTE — Telephone Encounter (Signed)
Patient called back and is aware.

## 2021-09-09 ENCOUNTER — Encounter (HOSPITAL_COMMUNITY)
Admission: RE | Disposition: A | Discharge status: Home / Self Care | Payer: Self-pay | Source: Home / Self Care | Attending: Internal Medicine

## 2021-09-09 ENCOUNTER — Ambulatory Visit (HOSPITAL_COMMUNITY): Payer: Medicaid Other | Admitting: Anesthesiology

## 2021-09-09 ENCOUNTER — Encounter (HOSPITAL_COMMUNITY): Payer: Self-pay

## 2021-09-09 ENCOUNTER — Other Ambulatory Visit: Payer: Self-pay

## 2021-09-09 ENCOUNTER — Ambulatory Visit (HOSPITAL_COMMUNITY)
Admission: RE | Admit: 2021-09-09 | Discharge: 2021-09-09 | Disposition: A | Discharge status: Home / Self Care | Payer: Medicaid Other | Attending: Internal Medicine | Admitting: Internal Medicine

## 2021-09-09 ENCOUNTER — Ambulatory Visit (HOSPITAL_BASED_OUTPATIENT_CLINIC_OR_DEPARTMENT_OTHER): Payer: Medicaid Other | Admitting: Anesthesiology

## 2021-09-09 DIAGNOSIS — Z8711 Personal history of peptic ulcer disease: Secondary | ICD-10-CM | POA: Insufficient documentation

## 2021-09-09 DIAGNOSIS — K2950 Unspecified chronic gastritis without bleeding: Secondary | ICD-10-CM | POA: Insufficient documentation

## 2021-09-09 DIAGNOSIS — K319 Disease of stomach and duodenum, unspecified: Secondary | ICD-10-CM | POA: Diagnosis not present

## 2021-09-09 DIAGNOSIS — Z8673 Personal history of transient ischemic attack (TIA), and cerebral infarction without residual deficits: Secondary | ICD-10-CM | POA: Insufficient documentation

## 2021-09-09 DIAGNOSIS — F17210 Nicotine dependence, cigarettes, uncomplicated: Secondary | ICD-10-CM | POA: Diagnosis not present

## 2021-09-09 DIAGNOSIS — R1310 Dysphagia, unspecified: Secondary | ICD-10-CM | POA: Diagnosis not present

## 2021-09-09 DIAGNOSIS — K2970 Gastritis, unspecified, without bleeding: Secondary | ICD-10-CM | POA: Diagnosis not present

## 2021-09-09 DIAGNOSIS — K219 Gastro-esophageal reflux disease without esophagitis: Secondary | ICD-10-CM | POA: Diagnosis not present

## 2021-09-09 DIAGNOSIS — K5900 Constipation, unspecified: Secondary | ICD-10-CM | POA: Insufficient documentation

## 2021-09-09 DIAGNOSIS — I10 Essential (primary) hypertension: Secondary | ICD-10-CM | POA: Insufficient documentation

## 2021-09-09 DIAGNOSIS — F419 Anxiety disorder, unspecified: Secondary | ICD-10-CM | POA: Insufficient documentation

## 2021-09-09 DIAGNOSIS — Z79899 Other long term (current) drug therapy: Secondary | ICD-10-CM | POA: Diagnosis not present

## 2021-09-09 DIAGNOSIS — J449 Chronic obstructive pulmonary disease, unspecified: Secondary | ICD-10-CM | POA: Diagnosis not present

## 2021-09-09 HISTORY — PX: ESOPHAGOGASTRODUODENOSCOPY (EGD) WITH PROPOFOL: SHX5813

## 2021-09-09 HISTORY — PX: BALLOON DILATION: SHX5330

## 2021-09-09 HISTORY — PX: BIOPSY: SHX5522

## 2021-09-09 SURGERY — ESOPHAGOGASTRODUODENOSCOPY (EGD) WITH PROPOFOL
Anesthesia: General

## 2021-09-09 MED ORDER — LACTATED RINGERS IV SOLN: INTRAVENOUS | Status: DC

## 2021-09-09 MED ORDER — LIDOCAINE 2% (20 MG/ML) 5 ML SYRINGE
INTRAMUSCULAR | Status: DC | PRN
  Administered 2021-09-09: 100 mg via INTRAVENOUS

## 2021-09-09 MED ORDER — PROPOFOL 10 MG/ML IV BOLUS
INTRAVENOUS | Status: DC | PRN
  Administered 2021-09-09: 10 mg via INTRAVENOUS
  Administered 2021-09-09: 90 mg via INTRAVENOUS
  Administered 2021-09-09 (×2): 20 mg via INTRAVENOUS

## 2021-09-09 NOTE — Anesthesia Preprocedure Evaluation (Addendum)
Anesthesia Evaluation  ?Patient identified by MRN, date of birth, ID band ?Patient awake ? ? ? ?Reviewed: ?Allergy & Precautions, H&P , NPO status , Patient's Chart, lab work & pertinent test results, reviewed documented beta blocker date and time  ? ?Airway ?Mallampati: II ? ?TM Distance: >3 FB ?Neck ROM: full ? ? ? Dental ?no notable dental hx. ?(+) Loose, Caps ?  ?Pulmonary ?shortness of breath, COPD, Current Smoker,  ?  ?Pulmonary exam normal ?breath sounds clear to auscultation ? ? ? ? ? ? Cardiovascular ?Exercise Tolerance: Good ?hypertension, + DOE  ? ?Rhythm:regular Rate:Normal ? ? ?  ?Neuro/Psych ?PSYCHIATRIC DISORDERS Anxiety Depression CVA, No Residual Symptoms   ? GI/Hepatic ?Neg liver ROS, PUD, GERD  Medicated,  ?Endo/Other  ?negative endocrine ROS ? Renal/GU ?negative Renal ROS  ?negative genitourinary ?  ?Musculoskeletal ? ? Abdominal ?  ?Peds ? Hematology ?negative hematology ROS ?(+)   ?Anesthesia Other Findings ? ? Reproductive/Obstetrics ?negative OB ROS ? ?  ? ? ? ? ? ? ? ? ? ? ? ? ? ?  ?  ? ? ? ? ? ? ? ?Anesthesia Physical ?Anesthesia Plan ? ?ASA: 3 ? ?Anesthesia Plan: General  ? ?Post-op Pain Management:   ? ?Induction:  ? ?PONV Risk Score and Plan: Propofol infusion ? ?Airway Management Planned:  ? ?Additional Equipment:  ? ?Intra-op Plan:  ? ?Post-operative Plan:  ? ?Informed Consent: I have reviewed the patients History and Physical, chart, labs and discussed the procedure including the risks, benefits and alternatives for the proposed anesthesia with the patient or authorized representative who has indicated his/her understanding and acceptance.  ? ? ? ?Dental Advisory Given ? ?Plan Discussed with: CRNA ? ?Anesthesia Plan Comments:   ? ? ? ? ? ? ?Anesthesia Quick Evaluation ? ?

## 2021-09-09 NOTE — Transfer of Care (Signed)
Immediate Anesthesia Transfer of Care Note ? ?Patient: Ian [name] ? ?Procedure(s) Performed: ESOPHAGOGASTRODUODENOSCOPY (EGD) WITH PROPOFOL ?BALLOON DILATION ?BIOPSY ? ?Patient Location: Short Stay ? ?Anesthesia Type:MAC ? ?Level of Consciousness: sedated, patient cooperative and responds to stimulation ? ?Airway & Oxygen Therapy: Patient Spontanous Breathing ? ?Post-op Assessment: Report given to RN, Post -op Vital signs reviewed and stable and Patient moving all extremities ? ?Post vital signs: Reviewed and stable ? ?Last Vitals:  ?Vitals Value Taken Time  ?BP    ?Temp 36.4 ?C 09/09/21 0950  ?Pulse 69 09/09/21 0950  ?Resp 18 09/09/21 0950  ?SpO2 94 % 09/09/21 0950  ? ? ?Last Pain:  ?Vitals:  ? 09/09/21 0950  ?TempSrc: Axillary  ?PainSc:   ?   ? ?  ? ?Complications: No notable events documented. ?

## 2021-09-09 NOTE — Interval H&P Note (Signed)
History and Physical Interval Note: ? ?09/09/2021 ?9:29 AM ? ?Ian [name]  has presented today for surgery, with the diagnosis of chronic GERD, dysphagia.  The various methods of treatment have been discussed with the patient and family. After consideration of risks, benefits and other options for treatment, the patient has consented to  Procedure(s) with comments: ?ESOPHAGOGASTRODUODENOSCOPY (EGD) WITH PROPOFOL (N/A) - 9:30am ?BALLOON DILATION (N/A) as a surgical intervention.  The patient's history has been reviewed, patient examined, no change in status, stable for surgery.  I have reviewed the patient's chart and labs.  Questions were answered to the patient's satisfaction.   ? ? ?Eloise Harman ? ? ?

## 2021-09-09 NOTE — Discharge Instructions (Addendum)
EGD ?Discharge instructions ?Please read the instructions outlined below and refer to this sheet in the next few weeks. These discharge instructions provide you with general information on caring for yourself after you leave the hospital. Your doctor may also give you specific instructions. While your treatment has been planned according to the most current medical practices available, unavoidable complications occasionally occur. If you have any problems or questions after discharge, please call your doctor. ?ACTIVITY ?You may resume your regular activity but move at a slower pace for the next 24 hours.  ?Take frequent rest periods for the next 24 hours.  ?Walking will help expel (get rid of) the air and reduce the bloated feeling in your abdomen.  ?No driving for 24 hours (because of the anesthesia (medicine) used during the test).  ?You may shower.  ?Do not sign any important legal documents or operate any machinery for 24 hours (because of the anesthesia used during the test).  ?NUTRITION ?Drink plenty of fluids.  ?You may resume your normal diet.  ?Begin with a light meal and progress to your normal diet.  ?Avoid alcoholic beverages for 24 hours or as instructed by your caregiver.  ?MEDICATIONS ?You may resume your normal medications unless your caregiver tells you otherwise.  ?WHAT YOU CAN EXPECT TODAY ?You may experience abdominal discomfort such as a feeling of fullness or ?gas? pains.  ?FOLLOW-UP ?Your doctor will discuss the results of your test with you.  ?SEEK IMMEDIATE MEDICAL ATTENTION IF ANY OF THE FOLLOWING OCCUR: ?Excessive nausea (feeling sick to your stomach) and/or vomiting.  ?Severe abdominal pain and distention (swelling).  ?Trouble swallowing.  ?Temperature over 101? F (37.8? C).  ?Rectal bleeding or vomiting of blood.  ? ?Your EGD revealed mild amount inflammation in your stomach.  I took biopsies of this to rule out infection with a bacteria called H. pylori.  Await pathology results, my  office will contact you.  I did stretch your esophagus today.  Hopefully this helps with your swallowing.  Continue on pantoprazole.  Follow-up with GI as needed. ? ? ? ?I hope you have a great rest of your week! ? ?Hennie Duos. Marletta Lor, D.O. ?Gastroenterology and Hepatology ?[facility] ? ?

## 2021-09-09 NOTE — Op Note (Signed)
[facility] ?Patient Name: Ian [name] ?Procedure Date: 09/09/2021 9:27 AM ?MRN: [id number] ?Date of Birth: Jun 26, 1960 ?Attending MD: Elon Alas. Abbey Chatters , DO ?CSN: [id number] ?Age: 61 ?Admit Type: Outpatient ?Procedure:                Upper GI endoscopy ?Indications:              Dysphagia, Heartburn ?Providers:                Elon Alas. Abbey Chatters, DO, [place] Page, Wendell                          Risa Grill, Technician ?Referring MD:              ?Medicines:                See the Anesthesia note for documentation of the  ?                          administered medications ?Complications:            No immediate complications. ?Estimated Blood Loss:     Estimated blood loss was minimal. ?Procedure:                Pre-Anesthesia Assessment: ?                          - The anesthesia plan was to use monitored  ?                          anesthesia care (MAC). ?                          After obtaining informed consent, the endoscope was  ?                          passed under direct vision. Throughout the  ?                          procedure, the patient's blood pressure, pulse, and  ?                          oxygen saturations were monitored continuously. The  ?                          GIF-H190 ([id number]) scope was introduced through the  ?                          mouth, and advanced to the second part of duodenum.  ?                          The upper GI endoscopy was accomplished without  ?                          difficulty. The patient tolerated the procedure  ?                          well. ?Scope In: 9:40:03 AM ?  Scope Out: 9:45:02 AM ?Total Procedure Duration: 0 hours 4 minutes 59 seconds  ?Findings: ?     There is no endoscopic evidence of bleeding, areas of erosion,  ?     esophagitis, ulcerations or varices in the entire esophagus. ?     No endoscopic abnormality was evident in the esophagus to explain the  ?     patient's complaint of dysphagia. Preparations were made for empiric  ?      dilation. A TTS dilator was passed through the scope. Dilation with an  ?     18-19-20 mm balloon dilator was performed to 20 mm. Dilation was  ?     performed with a mild resistance at 20 mm DUE TO POSSIBLE PROXIMAL  ?     ESOPHAGEAL WEB. Estimated blood loss was none. ?     Patchy mild inflammation characterized by erythema was found in the  ?     gastric body. Biopsies were taken with a cold forceps for Helicobacter  ?     pylori testing. ?     The duodenal bulb, first portion of the duodenum and second portion of  ?     the duodenum were normal. ?Impression:               - Gastritis. Biopsied. ?                          - Normal duodenal bulb, first portion of the  ?                          duodenum and second portion of the duodenum. ?Moderate Sedation: ?     Per Anesthesia Care ?Recommendation:           - Patient has a contact number available for  ?                          emergencies. The signs and symptoms of potential  ?                          delayed complications were discussed with the  ?                          patient. Return to normal activities tomorrow.  ?                          Written discharge instructions were provided to the  ?                          patient. ?                          - Resume previous diet. ?                          - Continue present medications. ?                          - Await pathology results. ?                          -  Repeat upper endoscopy PRN for retreatment. ?                          - Use Protonix (pantoprazole) 40 mg PO daily.  ?                          Resume Plavix today. ?                          - Return to GI clinic PRN. ?Procedure Code(s):        --- Professional --- ?                          [id number], Esophagogastroduodenoscopy, flexible,  ?                          transoral; with biopsy, single or multiple ?Diagnosis Code(s):        --- Professional --- ?                          K29.70, Gastritis, unspecified, without bleeding ?                           R13.10, Dysphagia, unspecified ?                          R12, Heartburn ?CPT copyright 2019 American Medical Association. All rights reserved. ?The codes documented in this report are preliminary and upon coder review may  ?be revised to meet current compliance requirements. ?Elon Alas. Abbey Chatters, DO ?Elon Alas. [place], DO ?09/09/2021 9:49:49 AM ?This report has been signed electronically. ?Number of Addenda: 0 ?

## 2021-09-10 LAB — SURGICAL PATHOLOGY

## 2021-09-10 NOTE — Anesthesia Postprocedure Evaluation (Signed)
Anesthesia Post Note ? ?Patient: Ian [name] ? ?Procedure(s) Performed: ESOPHAGOGASTRODUODENOSCOPY (EGD) WITH PROPOFOL ?BALLOON DILATION ?BIOPSY ? ?Patient location during evaluation: Phase II ?Anesthesia Type: General ?Level of consciousness: awake ?Pain management: pain level controlled ?Vital Signs Assessment: post-procedure vital signs reviewed and stable ?Respiratory status: spontaneous breathing and respiratory function stable ?Cardiovascular status: blood pressure returned to baseline and stable ?Postop Assessment: no headache and no apparent nausea or vomiting ?Anesthetic complications: no ?Comments: Late entry ? ? ?No notable events documented. ? ? ?Last Vitals:  ?Vitals:  ? 09/09/21 0950 09/09/21 0951  ?BP:  113/65  ?Pulse: 69 65  ?Resp: 18 20  ?Temp: (!) 36.4 ?C   ?SpO2: 94% 96%  ?  ?Last Pain:  ?Vitals:  ? 09/09/21 0951  ?TempSrc:   ?PainSc: 0-No pain  ? ? ?  ?  ?  ?  ?  ?  ? ?Windell [place] ? ? ? ? ?

## 2021-09-11 ENCOUNTER — Encounter (HOSPITAL_COMMUNITY): Payer: Self-pay | Admitting: Internal Medicine

## 2021-10-08 ENCOUNTER — Other Ambulatory Visit: Payer: Self-pay | Admitting: *Deleted

## 2021-10-08 MED ORDER — BREZTRI AEROSPHERE 160-9-4.8 MCG/ACT IN AERO: 2.0000 | INHALATION_SPRAY | Freq: Two times a day (BID) | RESPIRATORY_TRACT | 5 refills | Status: DC

## 2022-01-15 ENCOUNTER — Other Ambulatory Visit: Payer: Self-pay | Admitting: Cardiology

## 2022-01-15 DIAGNOSIS — E785 Hyperlipidemia, unspecified: Secondary | ICD-10-CM

## 2022-02-12 ENCOUNTER — Other Ambulatory Visit (HOSPITAL_COMMUNITY): Payer: Self-pay

## 2022-02-18 ENCOUNTER — Other Ambulatory Visit (HOSPITAL_COMMUNITY): Payer: Self-pay

## 2022-03-20 ENCOUNTER — Ambulatory Visit (HOSPITAL_COMMUNITY)
Admission: RE | Admit: 2022-03-20 | Discharge: 2022-03-20 | Disposition: A | Discharge status: Home / Self Care | Payer: Medicaid Other | Source: Ambulatory Visit | Attending: Family Medicine | Admitting: Family Medicine

## 2022-03-20 DIAGNOSIS — F17210 Nicotine dependence, cigarettes, uncomplicated: Secondary | ICD-10-CM | POA: Insufficient documentation

## 2022-03-24 ENCOUNTER — Other Ambulatory Visit: Payer: Self-pay | Admitting: Acute Care

## 2022-03-24 DIAGNOSIS — Z122 Encounter for screening for malignant neoplasm of respiratory organs: Secondary | ICD-10-CM

## 2022-03-24 DIAGNOSIS — Z87891 Personal history of nicotine dependence: Secondary | ICD-10-CM

## 2022-03-24 DIAGNOSIS — F17210 Nicotine dependence, cigarettes, uncomplicated: Secondary | ICD-10-CM

## 2022-07-07 ENCOUNTER — Other Ambulatory Visit: Payer: Self-pay | Admitting: Pulmonary Disease

## 2022-10-30 NOTE — Progress Notes (Signed)
@Patient  ID: Ian [name], male    DOB: 08-03-1960, 62 y.o.   MRN: [id number]  Chief Complaint  Patient presents with   Follow-up    Referring provider: April Manson, NP  HPI: 62 year old male, current everyday smoker (quit in April 2024).  Past medical history significant for COPD, aortic arthrosclerosis, hypertension, hyperlipidemia. Patient of Dr. Tonia Brooms, last seen on 08/19/21.   10/31/2022 Patient presents today for acute office visit due to hemoptysis.  Developed intermittent productive cough 4 weeks ago. Associated shortness of breath. Hemoptysis is better today compared to last week. Last episode was 5 days ago. Sputum was a mix of mucus and blood. He also reports lower back pain and leg pain. He has had this leg pain for 6 years. He takes gabapentin 600mg  twice daily. He has been using Breztri morning and evening as directed. He quit smoking October 08, 2022.  He had imaging of his lungs in October 2023 that showed lung RADS 1 along with mild diffuse bronchial wall thickening with mild centrilobular and paraseptal emphysema suggestive of COPD.   Allergies  Allergen Reactions   Asa [Aspirin] Itching   Ibuprofen Itching   Tylenol [Acetaminophen] Itching    Immunization History  Administered Date(s) Administered   Influenza, Seasonal, Injecte, Preservative Fre 03/19/2015   Influenza-Unspecified 05/27/2021   Janssen (J&J) SARS-COV-2 Vaccination 11/24/2019   PFIZER(Purple Top)SARS-COV-2 Vaccination 05/17/2020   Pneumococcal Polysaccharide-23 01/27/2017   Tdap 07/05/2005, 10/22/2015    Past Medical History:  Diagnosis Date   Anxiety    COPD (chronic obstructive pulmonary disease) (HCC)    Depression    Hx of cardiovascular stress test    Lexiscan Myoview (09/2013):  No ischemia, EF 62%; NORMAL   Leg pain    ABIs (09/29/13):  Normal.   Stomach ulcer    Stroke (HCC)    Tobacco abuse     Tobacco History: Social History   Tobacco Use  Smoking Status Some Days    Packs/day: 0.25   Years: 35.00   Additional pack years: 0.00   Total pack years: 8.75   Types: Cigarettes  Smokeless Tobacco Never  Tobacco Comments   1/2 pack since April 10/31/22   Ready to quit: Not Answered Counseling given: Not Answered Tobacco comments: 1/2 pack since April 10/31/22   Outpatient Medications Prior to Visit  Medication Sig Dispense Refill   ALPRAZolam (XANAX) 1 MG tablet Take 1 mg by mouth 3 (three) times daily as needed for sleep or anxiety.     atorvastatin (LIPITOR) 40 MG tablet TAKE 1 TABLET BY MOUTH EVERY DAY 90 tablet 3   BREZTRI AEROSPHERE 160-9-4.8 MCG/ACT AERO INHALE 2 PUFFS INTO THE LUNGS IN THE MORNING AND AT BEDTIME. 10.7 each 5   clopidogrel (PLAVIX) 75 MG tablet Take 75 mg by mouth daily.     FENOFIBRATE PO Take 135 mg by mouth daily.     gabapentin (NEURONTIN) 600 MG tablet Take 600 mg by mouth 2 (two) times daily.     pantoprazole (PROTONIX) 40 MG tablet Take 40 mg by mouth daily.     tamsulosin (FLOMAX) 0.4 MG CAPS capsule Take 0.4 mg by mouth daily.      Vitamin D, Ergocalciferol, (DRISDOL) 50000 UNITS CAPS capsule Take 50,000 Units by mouth every 7 (seven) days. Friday     lisinopril (ZESTRIL) 10 MG tablet Take 10 mg by mouth daily.     No facility-administered medications prior to visit.   Review of Systems  Review of Systems  Constitutional: Negative.   HENT:  Positive for congestion.   Respiratory:  Positive for cough and wheezing.   Cardiovascular: Negative.    Physical Exam  BP 112/72 (BP Location: Left Arm, Cuff Size: Normal)   Pulse 65   Ht 5\' 9"  (1.753 m)   Wt 222 lb (100.7 kg)   SpO2 99%   BMI 32.78 kg/m  Physical Exam Constitutional:      General: He is not in acute distress.    Appearance: Normal appearance. He is not ill-appearing.  HENT:     Mouth/Throat:     Mouth: Mucous membranes are moist.     Pharynx: Oropharynx is clear.  Cardiovascular:     Rate and Rhythm: Normal rate and regular rhythm.  Pulmonary:      Breath sounds: Wheezing and rhonchi present.  Musculoskeletal:        General: Normal range of motion.  Skin:    General: Skin is warm and dry.  Neurological:     General: No focal deficit present.     Mental Status: He is alert and oriented to person, place, and time. Mental status is at baseline.  Psychiatric:        Mood and Affect: Mood normal.        Behavior: Behavior normal.        Thought Content: Thought content normal.        Judgment: Judgment normal.      Lab Results:  CBC    Component Value Date/Time   WBC 8.6 07/25/2021 1909   RBC 5.18 07/25/2021 1909   HGB 16.3 07/25/2021 1909   HCT 47.2 07/25/2021 1909   PLT 217 07/25/2021 1909   MCV 91.1 07/25/2021 1909   MCH 31.5 07/25/2021 1909   MCHC 34.5 07/25/2021 1909   RDW 12.9 07/25/2021 1909   LYMPHSABS 1.7 07/25/2021 1909   MONOABS 0.7 07/25/2021 1909   EOSABS 0.1 07/25/2021 1909   BASOSABS 0.1 07/25/2021 1909    BMET    Component Value Date/Time   NA 132 (L) 07/25/2021 1909   NA 141 12/13/2019 0844   K 3.5 07/25/2021 1909   CL 101 07/25/2021 1909   CO2 21 (L) 07/25/2021 1909   GLUCOSE 97 07/25/2021 1909   BUN 16 07/25/2021 1909   BUN 11 12/13/2019 0844   CREATININE 0.75 07/25/2021 1909   CALCIUM 9.3 07/25/2021 1909   GFRNONAA >60 07/25/2021 1909   GFRAA 102 12/13/2019 0844    BNP No results found for: "BNP"  ProBNP No results found for: "PROBNP"  Imaging: DG Chest 2 View  Result Date: 10/31/2022 CLINICAL DATA:  62 year old male with hemoptysis. EXAM: CHEST - 2 VIEW COMPARISON:  Low-dose screening Chest CT 03/20/2022 and earlier. FINDINGS: PA and lateral views at 0916 hours. Upper lobe centrilobular emphysema demonstrated by CT last year. Lung volumes appear normal. Mediastinal contours are stable and within normal limits. Visualized tracheal air column is within normal limits. No pneumothorax, pulmonary edema, pleural effusion or confluent lung opacity. No acute osseous abnormality identified.  Negative visible bowel gas. IMPRESSION: Emphysema (ICD10-J43.9). No acute cardiopulmonary abnormality. Electronically Signed   By: Odessa Fleming M.D.   On: 10/31/2022 09:41     Assessment & Plan:   COPD with acute exacerbation (HCC) - Patient developed productive cough 3-4 weeks ago with associated hemoptysis and dyspnea symptoms. Cough has improved. VSS; Lung sounds with scattered rhonchi and wheezing. CXR today showed no acute process, emphysema. Checking basic labs including D-dimer. Low suspicion for  PE. Sending in RX Augmentin x 7 days and prednisone taper. Advised he take dextromethorphan 10ml every 6 hours for cough suppression. FU in 7-10 days or sooner if needed. Continue Breztri Aerosphere two puffs every 12 hours and Albuterol 2 puffs every 4-6 hours as needed for breakthrough sob/wheezing.   Hemoptysis - Secondary to COPD exacerbation/acute bronchitis - LDCT in October 2023 without suspicious appearing lung nodules or masses - CXR today was essentially normal, checking d-dimer  40 mins spent on case; > 50% face to face with patient   Glenford Bayley, NP 10/31/2022

## 2022-10-31 ENCOUNTER — Encounter: Payer: Self-pay | Admitting: Primary Care

## 2022-10-31 ENCOUNTER — Ambulatory Visit: Payer: Medicaid Other | Admitting: Primary Care

## 2022-10-31 ENCOUNTER — Ambulatory Visit (HOSPITAL_COMMUNITY): Payer: Medicaid Other

## 2022-10-31 ENCOUNTER — Ambulatory Visit (INDEPENDENT_AMBULATORY_CARE_PROVIDER_SITE_OTHER): Payer: Medicaid Other

## 2022-10-31 ENCOUNTER — Telehealth: Payer: Self-pay | Admitting: Primary Care

## 2022-10-31 VITALS — BP 112/72 | HR 65 | Ht 69.0 in | Wt 222.0 lb

## 2022-10-31 DIAGNOSIS — M79604 Pain in right leg: Secondary | ICD-10-CM

## 2022-10-31 DIAGNOSIS — R042 Hemoptysis: Secondary | ICD-10-CM

## 2022-10-31 DIAGNOSIS — J449 Chronic obstructive pulmonary disease, unspecified: Secondary | ICD-10-CM

## 2022-10-31 DIAGNOSIS — R7989 Other specified abnormal findings of blood chemistry: Secondary | ICD-10-CM

## 2022-10-31 DIAGNOSIS — J441 Chronic obstructive pulmonary disease with (acute) exacerbation: Secondary | ICD-10-CM | POA: Diagnosis not present

## 2022-10-31 LAB — CBC WITH DIFFERENTIAL/PLATELET
Basophils Absolute: 0.1 10*3/uL (ref 0.0–0.1)
Basophils Relative: 1 % (ref 0.0–3.0)
Eosinophils Absolute: 0.2 10*3/uL (ref 0.0–0.7)
Eosinophils Relative: 1.9 % (ref 0.0–5.0)
HCT: 46.9 % (ref 39.0–52.0)
Hemoglobin: 15.7 g/dL (ref 13.0–17.0)
Lymphocytes Relative: 19.1 % (ref 12.0–46.0)
Lymphs Abs: 1.6 10*3/uL (ref 0.7–4.0)
MCHC: 33.5 g/dL (ref 30.0–36.0)
MCV: 89.9 fl (ref 78.0–100.0)
Monocytes Absolute: 0.8 10*3/uL (ref 0.1–1.0)
Monocytes Relative: 9.2 % (ref 3.0–12.0)
Neutro Abs: 5.6 10*3/uL (ref 1.4–7.7)
Neutrophils Relative %: 68.8 % (ref 43.0–77.0)
Platelets: 282 10*3/uL (ref 150.0–400.0)
RBC: 5.22 Mil/uL (ref 4.22–5.81)
RDW: 13.5 % (ref 11.5–15.5)
WBC: 8.2 10*3/uL (ref 4.0–10.5)

## 2022-10-31 LAB — BASIC METABOLIC PANEL
BUN: 12 mg/dL (ref 6–23)
CO2: 31 mEq/L (ref 19–32)
Calcium: 9.4 mg/dL (ref 8.4–10.5)
Chloride: 102 mEq/L (ref 96–112)
Creatinine, Ser: 0.9 mg/dL (ref 0.40–1.50)
GFR: 92.12 mL/min (ref >60.00)
Glucose, Bld: 105 mg/dL — ABNORMAL HIGH (ref 70–99)
Potassium: 4 mEq/L (ref 3.5–5.1)
Sodium: 139 mEq/L (ref 135–145)

## 2022-10-31 LAB — D-DIMER, QUANTITATIVE: D-Dimer, Quant: 0.61 mcg/mL FEU — ABNORMAL HIGH (ref <0.50)

## 2022-10-31 MED ORDER — PREDNISONE 10 MG PO TABS: ORAL_TABLET | ORAL | 0 refills | Status: DC

## 2022-10-31 MED ORDER — AMOXICILLIN-POT CLAVULANATE 875-125 MG PO TABS
1.0000 | ORAL_TABLET | Freq: Two times a day (BID) | ORAL | 0 refills | Status: DC
Start: 2022-10-31 — End: 2022-11-18

## 2022-10-31 MED ORDER — ALBUTEROL SULFATE HFA 108 (90 BASE) MCG/ACT IN AERS: 2.0000 | INHALATION_SPRAY | Freq: Four times a day (QID) | RESPIRATORY_TRACT | 1 refills | Status: DC | PRN

## 2022-10-31 MED ORDER — DEXTROMETHORPHAN HBR 15 MG/5ML PO SYRP: 10.0000 mL | ORAL_SOLUTION | Freq: Four times a day (QID) | ORAL | 0 refills | Status: DC | PRN

## 2022-10-31 NOTE — Telephone Encounter (Signed)
Done

## 2022-10-31 NOTE — Assessment & Plan Note (Signed)
-   Secondary to COPD exacerbation/acute bronchitis - LDCT in October 2023 without suspicious appearing lung nodules or masses - CXR today was essentially normal, checking d-dimer

## 2022-10-31 NOTE — Patient Instructions (Signed)
Chest x-ray showed no acute process or pneumonia Treating you for suspected bronchitis/COPD flare with antibiotic and prednisone  Labs today   Recommendations  - Continue Breztri 2 puffs morning and evening - Use albuterol 2 puffs every 4-6 hours as needed for breakthrough shortness of breath or wheezing (this is a rescue inhaler and okay to use with Breztri)  - Use cough syrup 4 times a day as needed for cough suppression - Take antibiotic and prednisone as prescribed  Follow-up  - 7 to 10 days with [facility] NP or sooner

## 2022-10-31 NOTE — Assessment & Plan Note (Addendum)
-   Patient developed productive cough 3-4 weeks ago with associated hemoptysis and dyspnea symptoms. Cough has improved. VSS; Lung sounds with scattered rhonchi and wheezing. CXR today showed no acute process, emphysema. Checking basic labs including D-dimer. Low suspicion for PE. Sending in RX Augmentin course x 7 days and prednisone taper. Advised he take dextromethorphan 10ml every 6 hours for cough suppression. FU in 7-10 days or sooner if needed. Continue Breztri Aerosphere two puffs every 12 hours and Albuterol 2 puffs every 4-6 hours as needed for breakthrough sob/wheezing.

## 2022-10-31 NOTE — Telephone Encounter (Signed)
Orders will need to be placed as emergency if needs to be done today please.

## 2022-10-31 NOTE — Telephone Encounter (Signed)
CTA re-entered as stat

## 2022-10-31 NOTE — Progress Notes (Signed)
Needs stat CTA re: d-dimer elevated  Patient does not have mychart, we will need to call him

## 2022-10-31 NOTE — Telephone Encounter (Signed)
D-dimer was elevated needs CTA and bilateral lower extremity Dopplers. I ordered and spoke with patient

## 2022-10-31 NOTE — Progress Notes (Signed)
Spoke with pt and notified of results per [facility].  Pt verbalized understanding and denied any questions. He is already scheduled for CTA.

## 2022-11-03 ENCOUNTER — Ambulatory Visit (HOSPITAL_COMMUNITY)
Admission: RE | Admit: 2022-11-03 | Discharge: 2022-11-03 | Disposition: A | Discharge status: Home / Self Care | Payer: Medicaid Other | Source: Ambulatory Visit | Attending: Primary Care | Admitting: Primary Care

## 2022-11-03 DIAGNOSIS — R7989 Other specified abnormal findings of blood chemistry: Secondary | ICD-10-CM | POA: Diagnosis not present

## 2022-11-03 DIAGNOSIS — R042 Hemoptysis: Secondary | ICD-10-CM | POA: Insufficient documentation

## 2022-11-03 DIAGNOSIS — M79605 Pain in left leg: Secondary | ICD-10-CM | POA: Diagnosis present

## 2022-11-03 DIAGNOSIS — M79604 Pain in right leg: Secondary | ICD-10-CM | POA: Diagnosis present

## 2022-11-03 MED ORDER — IOHEXOL 350 MG/ML SOLN
80.0000 mL | Freq: Once | INTRAVENOUS | Status: AC | PRN
  Administered 2022-11-03: 75 mL via INTRAVENOUS

## 2022-11-18 ENCOUNTER — Encounter: Payer: Self-pay | Admitting: Primary Care

## 2022-11-18 ENCOUNTER — Ambulatory Visit: Payer: Medicaid Other | Admitting: Primary Care

## 2022-11-18 VITALS — BP 104/60 | HR 78 | Temp 98.4°F | Ht 69.0 in | Wt 217.8 lb

## 2022-11-18 DIAGNOSIS — J449 Chronic obstructive pulmonary disease, unspecified: Secondary | ICD-10-CM | POA: Diagnosis not present

## 2022-11-18 MED ORDER — AMOXICILLIN-POT CLAVULANATE 875-125 MG PO TABS: 1.0000 | ORAL_TABLET | Freq: Two times a day (BID) | ORAL | 0 refills | Status: DC

## 2022-11-18 MED ORDER — FLUTICASONE PROPIONATE 50 MCG/ACT NA SUSP: 1.0000 | Freq: Every day | NASAL | 2 refills | Status: DC

## 2022-11-18 MED ORDER — DM-GUAIFENESIN ER 30-600 MG PO TB12: 1.0000 | ORAL_TABLET | Freq: Two times a day (BID) | ORAL | 0 refills | Status: DC | PRN

## 2022-11-18 MED ORDER — CETIRIZINE HCL 10 MG PO TABS: 10.0000 mg | ORAL_TABLET | Freq: Every day | ORAL | 0 refills | Status: DC

## 2022-11-18 MED ORDER — BREZTRI AEROSPHERE 160-9-4.8 MCG/ACT IN AERO: 2.0000 | INHALATION_SPRAY | Freq: Two times a day (BID) | RESPIRATORY_TRACT | 5 refills | Status: DC

## 2022-11-18 MED ORDER — PREDNISONE 10 MG PO TABS: ORAL_TABLET | ORAL | 0 refills | Status: DC

## 2022-11-18 MED ORDER — BREZTRI AEROSPHERE 160-9-4.8 MCG/ACT IN AERO: 2.0000 | INHALATION_SPRAY | Freq: Two times a day (BID) | RESPIRATORY_TRACT | 0 refills | Status: DC

## 2022-11-18 NOTE — Progress Notes (Signed)
@Patient  ID: Ian [name], male    DOB: 08-10-1960, 62 y.o.   MRN: [id number]  Chief Complaint  Patient presents with   Follow-up    Cough and congestion this morning.    Referring provider: April Manson, NP  HPI: 62 year old male, current everyday smoker (quit in April 2024).  Past medical history significant for COPD, aortic arthrosclerosis, hypertension, hyperlipidemia. Patient of Dr. Tonia Brooms.  Previous [facility] pulmonary encounter:  10/31/2022 Patient presents today for acute office visit due to hemoptysis.  Developed intermittent productive cough 4 weeks ago. Associated shortness of breath. Hemoptysis is better today compared to last week. Last episode was 5 days ago. Sputum was a mix of mucus and blood. He also reports lower back pain and leg pain. He has had this leg pain for 6 years. He takes gabapentin 600mg  twice daily. He has been using Breztri morning and evening as directed. He quit smoking October 08, 2022.  He had imaging of his lungs in October 2023 that showed lung RADS 1 along with mild diffuse bronchial wall thickening with mild centrilobular and paraseptal emphysema suggestive of COPD.  11/18/2022- interim hx  Patient presents today for 2 week follow-up. He was seen on May 17th for COPD exacerbation/hemoptysis. He had scattered rhonchi on exam. CXR in office showed no acute process. D-dimer was elevated, CTA was negative for PE or acute findings; emphysema and aortic atherosclerosis. Sent in Augmentin and prednisone. Advised patient take Delsym cough syrup. Maintained on [employer] two puffs twice daily   He was feeling better until this morning when he woke up with increased chest congestion and productive cough. Completed abx and prednisone which he states helped a lot. Hemoptysis has resolved. He hs been having some lower back pain. He is pliant with Breztri Aerosphere 2 puffs twice daily.  He used albuterol inhaler 8 times over the last 2 weeks, he reports that this  helps his wheezing.  He brought all his medication in with him today.    Allergies  Allergen Reactions   Asa [Aspirin] Itching   Ibuprofen Itching   Tylenol [Acetaminophen] Itching    Immunization History  Administered Date(s) Administered   Influenza, Seasonal, Injecte, Preservative Fre 03/19/2015   Influenza-Unspecified 05/27/2021   Janssen (J&J) SARS-COV-2 Vaccination 11/24/2019   PFIZER(Purple Top)SARS-COV-2 Vaccination 05/17/2020   Pneumococcal Polysaccharide-23 01/27/2017   Tdap 07/05/2005, 10/22/2015    Past Medical History:  Diagnosis Date   Anxiety    COPD (chronic obstructive pulmonary disease) (HCC)    Depression    Hx of cardiovascular stress test    Lexiscan Myoview (09/2013):  No ischemia, EF 62%; NORMAL   Leg pain    ABIs (09/29/13):  Normal.   Stomach ulcer    Stroke (HCC)    Tobacco abuse     Tobacco History: Social History   Tobacco Use  Smoking Status Some Days   Packs/day: 0.25   Years: 35.00   Additional pack years: 0.00   Total pack years: 8.75   Types: Cigarettes  Smokeless Tobacco Never  Tobacco Comments   1/2 pack since April 10/31/22   Ready to quit: Not Answered Counseling given: Not Answered Tobacco comments: 1/2 pack since April 10/31/22   Outpatient Medications Prior to Visit  Medication Sig Dispense Refill   albuterol (VENTOLIN HFA) 108 (90 Base) MCG/ACT inhaler Inhale 2 puffs into the lungs every 6 (six) hours as needed for wheezing or shortness of breath. 8 g 1   ALPRAZolam (XANAX) 1 MG tablet  Take 1 mg by mouth 3 (three) times daily as needed for sleep or anxiety.     amoxicillin-clavulanate (AUGMENTIN) 875-125 MG tablet Take 1 tablet by mouth 2 (two) times daily. 14 tablet 0   atorvastatin (LIPITOR) 40 MG tablet TAKE 1 TABLET BY MOUTH EVERY DAY 90 tablet 3   BREZTRI AEROSPHERE 160-9-4.8 MCG/ACT AERO INHALE 2 PUFFS INTO THE LUNGS IN THE MORNING AND AT BEDTIME. 10.7 each 5   clopidogrel (PLAVIX) 75 MG tablet Take 75 mg by mouth  daily.     dextromethorphan 15 MG/5ML syrup Take 10 mLs (30 mg total) by mouth 4 (four) times daily as needed for cough. 240 mL 0   FENOFIBRATE PO Take 135 mg by mouth daily.     gabapentin (NEURONTIN) 600 MG tablet Take 600 mg by mouth 2 (two) times daily.     lisinopril (ZESTRIL) 10 MG tablet Take 10 mg by mouth daily.     pantoprazole (PROTONIX) 40 MG tablet Take 40 mg by mouth daily.     predniSONE (DELTASONE) 10 MG tablet 4 tabs for 2 days, then 3 tabs for 2 days, 2 tabs for 2 days, then 1 tab for 2 days, then stop 20 tablet 0   tamsulosin (FLOMAX) 0.4 MG CAPS capsule Take 0.4 mg by mouth daily.      Vitamin D, Ergocalciferol, (DRISDOL) 50000 UNITS CAPS capsule Take 50,000 Units by mouth every 7 (seven) days. Friday     No facility-administered medications prior to visit.   Review of Systems  Review of Systems  HENT:  Positive for congestion and postnasal drip.   Respiratory:  Positive for cough, shortness of breath and wheezing.    Physical Exam  There were no vitals taken for this visit. Physical Exam Constitutional:      Appearance: Normal appearance. He is not ill-appearing.  HENT:     Head: Normocephalic and atraumatic.     Mouth/Throat:     Mouth: Mucous membranes are moist.     Pharynx: Oropharynx is clear.  Cardiovascular:     Rate and Rhythm: Normal rate and regular rhythm.  Pulmonary:     Effort: Pulmonary effort is normal.     Breath sounds: Wheezing and rhonchi present.     Comments: Scattered wheezing/rhonchi, partially clears with cough  Musculoskeletal:        General: Normal range of motion.  Skin:    General: Skin is warm and dry.  Neurological:     General: No focal deficit present.     Mental Status: He is alert and oriented to person, place, and time. Mental status is at baseline.  Psychiatric:        Mood and Affect: Mood normal.        Behavior: Behavior normal.        Thought Content: Thought content normal.        Judgment: Judgment normal.       Lab Results:  CBC    Component Value Date/Time   WBC 8.2 10/31/2022 1020   RBC 5.22 10/31/2022 1020   HGB 15.7 10/31/2022 1020   HCT 46.9 10/31/2022 1020   PLT 282.0 10/31/2022 1020   MCV 89.9 10/31/2022 1020   MCH 31.5 07/25/2021 1909   MCHC 33.5 10/31/2022 1020   RDW 13.5 10/31/2022 1020   LYMPHSABS 1.6 10/31/2022 1020   MONOABS 0.8 10/31/2022 1020   EOSABS 0.2 10/31/2022 1020   BASOSABS 0.1 10/31/2022 1020    BMET    Component Value  Date/Time   NA 139 10/31/2022 1020   NA 141 12/13/2019 0844   K 4.0 10/31/2022 1020   CL 102 10/31/2022 1020   CO2 31 10/31/2022 1020   GLUCOSE 105 (H) 10/31/2022 1020   BUN 12 10/31/2022 1020   BUN 11 12/13/2019 0844   CREATININE 0.90 10/31/2022 1020   CALCIUM 9.4 10/31/2022 1020   GFRNONAA >60 07/25/2021 1909   GFRAA 102 12/13/2019 0844    BNP No results found for: "BNP"  ProBNP No results found for: "PROBNP"  Imaging: VAS [place] LOWER EXTREMITY VENOUS (DVT)  Result Date: 11/03/2022  Lower Venous DVT Study Patient Name:  JERMYN HOSCHAR  Date of Exam:   11/03/2022 Medical Rec #: [id number]        Accession #:    [id number] Date of Birth: [birth year]        Patient Gender: M Patient Age:   88 years Exam Location:  [facility] Procedure:      VAS [place] LOWER EXTREMITY VENOUS (DVT) Referring Phys: Ames Dura --------------------------------------------------------------------------------  Indications: Elevated D-dimer (0.61), and Pain.  Comparison Study: Previous exam on 11/09/2014 was negative for DVT. Performing Technologist: Ernestene Mention RVT, RDMS  Examination Guidelines: A complete evaluation includes B-mode imaging, spectral Doppler, color Doppler, and power Doppler as needed of all accessible portions of each vessel. Bilateral testing is considered an integral part of a complete examination. Limited examinations for reoccurring indications may be performed as noted. The reflux portion of the exam is performed with the patient  in reverse Trendelenburg.  +---------+---------------+---------+-----------+----------+--------------+ RIGHT    CompressibilityPhasicitySpontaneityPropertiesThrombus Aging +---------+---------------+---------+-----------+----------+--------------+ CFV      Full           Yes      Yes                                 +---------+---------------+---------+-----------+----------+--------------+ SFJ      Full                                                        +---------+---------------+---------+-----------+----------+--------------+ FV Prox  Full           Yes      Yes                                 +---------+---------------+---------+-----------+----------+--------------+ FV Mid   Full           Yes      Yes                                 +---------+---------------+---------+-----------+----------+--------------+ FV DistalFull           Yes      Yes                                 +---------+---------------+---------+-----------+----------+--------------+ PFV      Full                                                        +---------+---------------+---------+-----------+----------+--------------+  POP      Full           Yes      Yes                                 +---------+---------------+---------+-----------+----------+--------------+ PTV      Full                                                        +---------+---------------+---------+-----------+----------+--------------+ PERO     Full                                                        +---------+---------------+---------+-----------+----------+--------------+   +---------+---------------+---------+-----------+----------+--------------+ LEFT     CompressibilityPhasicitySpontaneityPropertiesThrombus Aging +---------+---------------+---------+-----------+----------+--------------+ CFV      Full           Yes      Yes                                  +---------+---------------+---------+-----------+----------+--------------+ SFJ      Full                                                        +---------+---------------+---------+-----------+----------+--------------+ FV Prox  Full           Yes      Yes                                 +---------+---------------+---------+-----------+----------+--------------+ FV Mid   Full           Yes      Yes                                 +---------+---------------+---------+-----------+----------+--------------+ FV DistalFull           Yes      Yes                                 +---------+---------------+---------+-----------+----------+--------------+ PFV      Full                                                        +---------+---------------+---------+-----------+----------+--------------+ POP      Full           Yes      Yes                                 +---------+---------------+---------+-----------+----------+--------------+ PTV  Full                                                        +---------+---------------+---------+-----------+----------+--------------+ PERO     Full                                                        +---------+---------------+---------+-----------+----------+--------------+     Summary: BILATERAL: - No evidence of deep vein thrombosis seen in the lower extremities, bilaterally. -No evidence of popliteal cyst, bilaterally.   *See table(s) above for measurements and observations. Electronically signed by Sherald Hess MD on 11/03/2022 at 11:35:09 AM.    Final    CT Angio Chest Pulmonary Embolism (PE) W or WO Contrast  Result Date: 11/03/2022 CLINICAL DATA:  Rule out pulmonary embolism. High probability. Hemoptysis. EXAM: CT ANGIOGRAPHY CHEST WITH CONTRAST TECHNIQUE: Multidetector CT imaging of the chest was performed using the standard protocol during bolus administration of intravenous contrast. Multiplanar CT  image reconstructions and MIPs were obtained to evaluate the vascular anatomy. RADIATION DOSE REDUCTION: This exam was performed according to the departmental dose-optimization program which includes automated exposure control, adjustment of the mA and/or kV according to patient size and/or use of iterative reconstruction technique. CONTRAST:  75mL OMNIPAQUE IOHEXOL 350 MG/ML SOLN COMPARISON:  03/20/2022 FINDINGS: Cardiovascular: Satisfactory opacification of the pulmonary arteries to the segmental level. No evidence of pulmonary embolism. Normal heart size. No pericardial effusion. Aortic atherosclerotic calcifications. Mediastinum/Nodes: No enlarged mediastinal, hilar, or axillary lymph nodes. Thyroid gland, trachea, and esophagus demonstrate no significant findings. Lungs/Pleura: Lungs are clear. No pleural effusion or pneumothorax. Upper Abdomen: No pleural effusion, airspace consolidation, or pneumothorax. Mild emphysema. Stable scarring within the posterior right lung base. No suspicious pulmonary nodule or mass identified. Musculoskeletal: No chest wall abnormality. No acute or significant osseous findings. Review of the MIP images confirms the above findings. IMPRESSION: 1. No evidence for acute pulmonary embolism. 2. No acute findings identified to explain patient's chest pain and shortness of breath. 3. Aortic Atherosclerosis (ICD10-I70.0) and Emphysema (ICD10-J43.9). Electronically Signed   By: Signa Kell M.D.   On: 11/03/2022 08:21   DG Chest 2 View  Result Date: 10/31/2022 CLINICAL DATA:  62 year old male with hemoptysis. EXAM: CHEST - 2 VIEW COMPARISON:  Low-dose screening Chest CT 03/20/2022 and earlier. FINDINGS: PA and lateral views at 0916 hours. Upper lobe centrilobular emphysema demonstrated by CT last year. Lung volumes appear normal. Mediastinal contours are stable and within normal limits. Visualized tracheal air column is within normal limits. No pneumothorax, pulmonary edema, pleural  effusion or confluent lung opacity. No acute osseous abnormality identified. Negative visible bowel gas. IMPRESSION: Emphysema (ICD10-J43.9). No acute cardiopulmonary abnormality. Electronically Signed   By: Odessa Fleming M.D.   On: 10/31/2022 09:41     Assessment & Plan:   No problem-specific Assessment & Plan notes found for this encounter.  Slow to resolve COPD exacerbation. He initially saw benefit after taking Augmentin and prednisone, needs longer course.    Recommendations: - Continue Breztri 2 puffs every morning and evening - Use albuterol rescue inhaler 2 puffs every 6 hours for breakthrough shortness of breath - Start Zyrtec 10mg  daily  for 2-4 weeks - Start fluticasone nasal spray 1 puff per nostril daily x 2-4 weeks  - Start mucinex-dm 600mg  twice daily x 14 days - Use flutter valve three times a day to loosen chest congestion  - Extending prednisone taper and Augmentin course   Follow-up:  - 10-14 days with Jovita Gamma, NP 11/18/2022

## 2022-11-18 NOTE — Patient Instructions (Addendum)
Recommendations: - Continue Breztri 2 puffs every morning and evening - Use albuterol rescue inhaler 2 puffs every 6 hours for breakthrough shortness of breath - Start Zyrtec 10mg  daily for 2-4 weeks - Start fluticasone nasal spray 1 puff per nostril daily x 2-4 weeks  - Start mucinex-dm 600mg  twice daily x 14 days - Use flutter valve three times a day to loosen chest congestion  - Extending prednisone taper and Augmentin course   Follow-up:  - 10-14 days with Waynetta Sandy Np

## 2022-12-10 ENCOUNTER — Other Ambulatory Visit: Payer: Self-pay | Admitting: Primary Care

## 2022-12-23 ENCOUNTER — Other Ambulatory Visit: Payer: Self-pay | Admitting: Cardiology

## 2022-12-23 DIAGNOSIS — E785 Hyperlipidemia, unspecified: Secondary | ICD-10-CM

## 2022-12-25 ENCOUNTER — Ambulatory Visit: Payer: Medicaid Other | Admitting: Primary Care

## 2022-12-25 ENCOUNTER — Encounter: Payer: Self-pay | Admitting: Primary Care

## 2022-12-25 VITALS — BP 136/70 | HR 75 | Temp 98.4°F | Ht 69.0 in | Wt 221.2 lb

## 2022-12-25 DIAGNOSIS — J441 Chronic obstructive pulmonary disease with (acute) exacerbation: Secondary | ICD-10-CM

## 2022-12-25 MED ORDER — ROFLUMILAST 250 MCG PO TABS: 1.0000 | ORAL_TABLET | Freq: Every day | ORAL | 0 refills | Status: DC

## 2022-12-25 NOTE — Patient Instructions (Addendum)
Recommendations: Continue Breztri Aerosphere 2 puffs morning and evening Albuterol 2 puffs every 6 hours as needed for breakthrough shortness of breath or wheezing Continue cetirizine 10 mg daily Continue Flonase nasal spray Continue mucinex morning and evening Use flutter valve three times a day to loosen congestion  Start Daliresp 250 mcg daily (used to help decrease frequency of COPD flare ups) STRONGLY encourage you quit smoking; target date by next visit in August   Rx: Daliresp 250mg  daily   Follow-up: August with Dr. Tonia Brooms   Steps to Quit Smoking Smoking tobacco is the leading cause of preventable death. It can affect almost every organ in the body. Smoking puts you and people around you at risk for many serious, long-lasting (chronic) diseases. Quitting smoking can be hard, but it is one of the best things that you can do for your health. It is never too late to quit. Do not give up if you cannot quit the first time. Some people need to try many times to quit. Do your best to stick to your quit plan, and talk with your doctor if you have any questions or concerns. How do I get ready to quit? Pick a date to quit. Set a date within the next 2 weeks to give you time to prepare. Write down the reasons why you are quitting. Keep this list in places where you will see it often. Tell your family, friends, and co-workers that you are quitting. Their support is important. Talk with your doctor about the choices that may help you quit. Find out if your health insurance will pay for these treatments. Know the people, places, things, and activities that make you want to smoke (triggers). Avoid them. What first steps can I take to quit smoking? Throw away all cigarettes at home, at work, and in your car. Throw away the things that you use when you smoke, such as ashtrays and lighters. Clean your car. Empty the ashtray. Clean your home, including curtains and carpets. What can I do to help  me quit smoking? Talk with your doctor about taking medicines and seeing a counselor. You are more likely to succeed when you do both. If you are pregnant or breastfeeding: Talk with your doctor about counseling or other ways to quit smoking. Do not take medicine to help you quit smoking unless your doctor tells you to. Quit right away Quit smoking completely, instead of slowly cutting back on how much you smoke over a period of time. Stopping smoking right away may be more successful than slowly quitting. Go to counseling. In-person is best if this is an option. You are more likely to quit if you go to counseling sessions regularly. Take medicine You may take medicines to help you quit. Some medicines need a prescription, and some you can buy over-the-counter. Some medicines may contain a drug called nicotine to replace the nicotine in cigarettes. Medicines may: Help you stop having the desire to smoke (cravings). Help to stop the problems that come when you stop smoking (withdrawal symptoms). Your doctor may ask you to use: Nicotine patches, gum, or lozenges. Nicotine inhalers or sprays. Non-nicotine medicine that you take by mouth. Find resources Find resources and other ways to help you quit smoking and remain smoke-free after you quit. They include: Online chats with a Veterinary surgeon. Phone quitlines. Printed Materials engineer. Support groups or group counseling. Text messaging programs. Mobile phone apps. Use apps on your mobile phone or tablet that can help you stick to your  quit plan. Examples of free services include Quit Guide from the CDC and smokefree.gov  What can I do to make it easier to quit?  Talk to your family and friends. Ask them to support and encourage you. Call a phone quitline, such as 1-800-QUIT-NOW, reach out to support groups, or work with a Veterinary surgeon. Ask people who smoke to not smoke around you. Avoid places that make you want to smoke, such  as: Bars. Parties. Smoke-break areas at work. Spend time with people who do not smoke. Lower the stress in your life. Stress can make you want to smoke. Try these things to lower stress: Getting regular exercise. Doing deep-breathing exercises. Doing yoga. Meditating. What benefits will I see if I quit smoking? Over time, you may have: A better sense of smell and taste. Less coughing and sore throat. A slower heart rate. Lower blood pressure. Clearer skin. Better breathing. Fewer sick days. Summary Quitting smoking can be hard, but it is one of the best things that you can do for your health. Do not give up if you cannot quit the first time. Some people need to try many times to quit. When you decide to quit smoking, make a plan to help you succeed. Quit smoking right away, not slowly over a period of time. When you start quitting, get help and support to keep you smoke-free. This information is not intended to replace advice given to you by your health care provider. Make sure you discuss any questions you have with your health care provider. Document Revised: 05/24/2021 Document Reviewed: 05/24/2021 Elsevier Patient Education  2024 [employer].

## 2022-12-25 NOTE — Progress Notes (Signed)
@Patient  ID: Ian [name], male    DOB: 06-17-1960, 62 y.o.   MRN: [id number]  Chief Complaint  Patient presents with   Follow-up    Doing well.  Breztri helping with breathing.    Referring provider: April Manson, NP  HPI: 62 year old male, current everyday smoker (quit in April 2024).  Past medical history significant for COPD, aortic arthrosclerosis, hypertension, hyperlipidemia. Patient of Dr. Tonia Brooms.   Previous [facility] pulmonary encounter:  10/31/2022 Patient presents today for acute office visit due to hemoptysis.  Developed intermittent productive cough 4 weeks ago. Associated shortness of breath. Hemoptysis is better today compared to last week. Last episode was 5 days ago. Sputum was a mix of mucus and blood. He also reports lower back pain and leg pain. He has had this leg pain for 6 years. He takes gabapentin 600mg  twice daily. He has been using Breztri morning and evening as directed. He quit smoking October 08, 2022.  He had imaging of his lungs in October 2023 that showed lung RADS 1 along with mild diffuse bronchial wall thickening with mild centrilobular and paraseptal emphysema suggestive of COPD.  11/18/2022 Patient presents today for 2 week follow-up. He was seen on May 17th for COPD exacerbation/hemoptysis. He had scattered rhonchi on exam. CXR in office showed no acute process. D-dimer was elevated, CTA was negative for PE or acute findings; emphysema and aortic atherosclerosis. Sent in Augmentin and prednisone. Advised patient take Delsym cough syrup. Maintained on [employer] two puffs twice daily   He was feeling better until this morning when he woke up with increased chest congestion and productive cough. Completed abx and prednisone which he states helped a lot. Hemoptysis has resolved. He hs been having some lower back pain. He is pliant with Breztri Aerosphere 2 puffs twice daily.  He used albuterol inhaler 8 times over the last 2 weeks, he reports that this  helps his wheezing.  He brought all his medication in with him today.  Slow to resolve COPD exacerbation. He initially saw benefit after taking Augmentin and prednisone, needs longer course.     Recommendations: - Continue Breztri 2 puffs every morning and evening - Use albuterol rescue inhaler 2 puffs every 6 hours for breakthrough shortness of breath - Start Zyrtec 10mg  daily for 2-4 weeks - Start fluticasone nasal spray 1 puff per nostril daily x 2-4 weeks  - Start mucinex-dm 600mg  twice daily x 14 days - Use flutter valve three times a day to loosen chest congestion  - Extending prednisone taper and Augmentin course    Follow-up:  - 10-14 days with Beth Np  12/25/2022- Interim hx  Patient presents today for follow-up COPD. He was treated in May/June for COPD exacerbation/hemoptysis. Symptoms were slow to resolved. CTA was negative for PE or acute findings; Clear lungs, emphysema and aortic atherosclerosis.   Overall he is feeling better. Continues to have a chronic cough with some mucus production with associated wheezing. He is compliant with all his medications. Using Breztri inhaler twice daily. Uses Albuterol at bedtime. He has been taking cetrizine daily which he feels has helped. He is still smoking about 3 cigarettes a day.    Allergies  Allergen Reactions   Asa [Aspirin] Itching   Ibuprofen Itching   Tylenol [Acetaminophen] Itching    Immunization History  Administered Date(s) Administered   Influenza, Seasonal, Injecte, Preservative Fre 03/19/2015   Influenza-Unspecified 05/27/2021   Janssen (J&J) SARS-COV-2 Vaccination 11/24/2019   PFIZER(Purple Top)SARS-COV-2 Vaccination 05/17/2020  Pneumococcal Polysaccharide-23 01/27/2017   Tdap 07/05/2005, 10/22/2015    Past Medical History:  Diagnosis Date   Anxiety    COPD (chronic obstructive pulmonary disease) (HCC)    Depression    Hx of cardiovascular stress test    Lexiscan Myoview (09/2013):  No ischemia, EF 62%;  NORMAL   Leg pain    ABIs (09/29/13):  Normal.   Stomach ulcer    Stroke (HCC)    Tobacco abuse     Tobacco History: Social History   Tobacco Use  Smoking Status Some Days   Current packs/day: 0.25   Average packs/day: 0.3 packs/day for 35.0 years (8.8 ttl pk-yrs)   Types: Cigarettes  Smokeless Tobacco Never  Tobacco Comments   1/2 pack since April 10/31/22   Ready to quit: Not Answered Counseling given: Not Answered Tobacco comments: 1/2 pack since April 10/31/22   Outpatient Medications Prior to Visit  Medication Sig Dispense Refill   albuterol (VENTOLIN HFA) 108 (90 Base) MCG/ACT inhaler Inhale 2 puffs into the lungs every 6 (six) hours as needed for wheezing or shortness of breath. 8 g 1   ALPRAZolam (XANAX) 1 MG tablet Take 1 mg by mouth 3 (three) times daily as needed for sleep or anxiety.     atorvastatin (LIPITOR) 40 MG tablet TAKE 1 TABLET BY MOUTH EVERY DAY 15 tablet 0   Budeson-Glycopyrrol-Formoterol (BREZTRI AEROSPHERE) 160-9-4.8 MCG/ACT AERO Inhale 2 puffs into the lungs in the morning and at bedtime. 10.7 each 5   Budeson-Glycopyrrol-Formoterol (BREZTRI AEROSPHERE) 160-9-4.8 MCG/ACT AERO Inhale 2 puffs into the lungs in the morning and at bedtime. 1 each 0   cetirizine (ZYRTEC) 10 MG tablet TAKE 1 TABLET BY MOUTH EVERY DAY 90 tablet 2   clopidogrel (PLAVIX) 75 MG tablet Take 75 mg by mouth daily.     dextromethorphan-guaiFENesin (MUCINEX DM) 30-600 MG 12hr tablet Take 1 tablet by mouth 2 (two) times daily as needed for cough. 30 tablet 0   FENOFIBRATE PO Take 135 mg by mouth daily.     fluticasone (FLONASE) 50 MCG/ACT nasal spray Place 1 spray into both nostrils daily. 16 g 2   gabapentin (NEURONTIN) 600 MG tablet Take 600 mg by mouth 2 (two) times daily.     pantoprazole (PROTONIX) 40 MG tablet Take 40 mg by mouth daily.     predniSONE (DELTASONE) 10 MG tablet 4 tabs for 2 days, then 3 tabs for 2 days, 2 tabs for 2 days, then 1 tab for 2 days, then stop 20 tablet 0    tamsulosin (FLOMAX) 0.4 MG CAPS capsule Take 0.4 mg by mouth daily.      Vitamin D, Ergocalciferol, (DRISDOL) 50000 UNITS CAPS capsule Take 50,000 Units by mouth every 7 (seven) days. Friday     amoxicillin-clavulanate (AUGMENTIN) 875-125 MG tablet Take 1 tablet by mouth 2 (two) times daily. (Patient not taking: Reported on 12/25/2022) 10 tablet 0   lisinopril (ZESTRIL) 10 MG tablet Take 10 mg by mouth daily.     No facility-administered medications prior to visit.   Review of Systems  Review of Systems  Constitutional: Negative.   Respiratory:  Positive for cough, shortness of breath and wheezing.     Physical Exam  BP 136/70 (BP Location: Left Arm, Patient Position: Sitting, Cuff Size: Large)   Pulse 75   Temp 98.4 F (36.9 C) (Oral)   Ht 5\' 9"  (1.753 m)   Wt 221 lb 3.2 oz (100.3 kg)   SpO2 95%   BMI 32.67  kg/m  Physical Exam Constitutional:      Appearance: Normal appearance.  HENT:     Head: Normocephalic and atraumatic.  Cardiovascular:     Rate and Rhythm: Normal rate and regular rhythm.  Pulmonary:     Effort: Pulmonary effort is normal.     Breath sounds: Wheezing and rhonchi present.  Skin:    General: Skin is warm and dry.  Neurological:     General: No focal deficit present.     Mental Status: He is alert and oriented to person, place, and time. Mental status is at baseline.  Psychiatric:        Mood and Affect: Mood normal.        Behavior: Behavior normal.        Thought Content: Thought content normal.        Judgment: Judgment normal.      Lab Results:  CBC    Component Value Date/Time   WBC 8.2 10/31/2022 1020   RBC 5.22 10/31/2022 1020   HGB 15.7 10/31/2022 1020   HCT 46.9 10/31/2022 1020   PLT 282.0 10/31/2022 1020   MCV 89.9 10/31/2022 1020   MCH 31.5 07/25/2021 1909   MCHC 33.5 10/31/2022 1020   RDW 13.5 10/31/2022 1020   LYMPHSABS 1.6 10/31/2022 1020   MONOABS 0.8 10/31/2022 1020   EOSABS 0.2 10/31/2022 1020   BASOSABS 0.1  10/31/2022 1020    BMET    Component Value Date/Time   NA 139 10/31/2022 1020   NA 141 12/13/2019 0844   K 4.0 10/31/2022 1020   CL 102 10/31/2022 1020   CO2 31 10/31/2022 1020   GLUCOSE 105 (H) 10/31/2022 1020   BUN 12 10/31/2022 1020   BUN 11 12/13/2019 0844   CREATININE 0.90 10/31/2022 1020   CALCIUM 9.4 10/31/2022 1020   GFRNONAA >60 07/25/2021 1909   GFRAA 102 12/13/2019 0844    BNP No results found for: "BNP"  ProBNP No results found for: "PROBNP"  Imaging: No results found.   Assessment & Plan:   COPD with acute exacerbation (HCC) - Improved; He was treated in May/June for COPD exacerbation/hemoptysis. Symptoms were slow to resolved. CTA was negative for PE or acute findings.  - Overall back to his baseline but still has moderate symptom burden. He has a chronic cough with mucus production and wheezing.  - Continue Breztri Aerosphere two puffs twice daily  - Continue mucinex morning and evening - Start using flutter  valve three times a day to loosen congestion  - Start Daliresp 250 mcg daily to help decrease frequency of COPD flare ups - STRONGLY encourage smoking cessation; target date by next visit in August      Glenford Bayley, NP 12/29/2022

## 2022-12-29 NOTE — Assessment & Plan Note (Addendum)
-   Improved; He was treated in May/June for COPD exacerbation/hemoptysis. Symptoms were slow to resolved. CTA was negative for PE or acute findings.  - Overall back to his baseline but still has moderate symptom burden. He has a chronic cough with mucus production and wheezing.  - Continue Breztri Aerosphere two puffs twice daily  - Continue mucinex morning and evening - Start using flutter  valve three times a day to loosen congestion  - Start Daliresp 250 mcg daily to help decrease frequency of COPD flare ups; plan increase dose to at follow-up if tolerating  - STRONGLY encourage smoking cessation; target date by next visit in August

## 2023-01-28 ENCOUNTER — Other Ambulatory Visit: Payer: Self-pay | Admitting: Cardiology

## 2023-01-28 DIAGNOSIS — E785 Hyperlipidemia, unspecified: Secondary | ICD-10-CM

## 2023-02-09 ENCOUNTER — Encounter: Payer: Self-pay | Admitting: Pulmonary Disease

## 2023-02-09 ENCOUNTER — Ambulatory Visit: Payer: Medicaid Other | Admitting: Pulmonary Disease

## 2023-02-09 VITALS — BP 100/62 | HR 79 | Ht 69.0 in | Wt 220.4 lb

## 2023-02-09 DIAGNOSIS — F17200 Nicotine dependence, unspecified, uncomplicated: Secondary | ICD-10-CM

## 2023-02-09 DIAGNOSIS — Z720 Tobacco use: Secondary | ICD-10-CM

## 2023-02-09 DIAGNOSIS — J449 Chronic obstructive pulmonary disease, unspecified: Secondary | ICD-10-CM | POA: Diagnosis not present

## 2023-02-09 NOTE — Progress Notes (Signed)
Synopsis: Referred in August 2019 for wheezing/SOB by April Manson, NP  Subjective:   PATIENT ID: Ian [name] GENDER: male DOB: Jan 20, 1961, MRN: [id number]  Chief Complaint  Patient presents with   Follow-up    F/up    Initial office visit: PMH of tobacco abuse and CVA. He was recently started on an inhaler, twice in the morning and twice at night. He is still having trouble breathing.   He has been smoking since 62 years old, and by teenager he was smoking regularly. He is currently down to a pack per day. At his max he was smoking 2-3 packs per day, >50 pack year history. He has never had PFTs before or seen a pulmonologist. His biggest trouble with his breathing is with exertion. He occasionally has chest tightness with exertion. He admits to chest pain behind his sternum with climbing up a hill.  His chest pain is associated with shortness of breath.  His shortness of breath is also associated with cough in the morning and cough at night.  He does have mild amount of sputum production that is usually whitish in color.  He wheezes on occasion.  After being started on his inhaler Symbicort 2 puffs twice daily he does feel better.  FMHX father with MI, grandfather with MI and brother with CAD s/p CABGX5.   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  OV 03/12/2018: He is doing some better. He is still smoking. He had PFTs completed today, appears to have a mixed restrictive and obstructive defect. He feels like the inhalers are working. He still has dyspnea on exertion.  He does have breathlessness with climbing stairs or climbing he will.  He is cough may be some better.  He does wake up in the morning with sputum production which is yellowish in color.  He denies hemoptysis.  I reviewed recent CT imaging with the patient.  There is evidence of centrilobular emphysema.  No found etiology of his  weight loss.  The patient does admit that he difficulty with reading.  And has struggled with his medications at home and being able to read and interpret the instructions on the bottles inhalers.  He believes that he is doing them correctly however.  OV 01/27/2019: Patient here today for follow-up of COPD.  Last seen by me back in September.  Otherwise doing well.  Unfortunately has been unable to purchase his medications due to cost.  Patient also has literacy problems and has much difficulty understanding when to use his medications.  We will try to simplify his regimen today.  He is very concerned that he could develop lung cancer at some point.  Unfortunately he is still smoking around 5 cigarettes/day.  He knows that he needs to quit but he is having much difficulty with that as well.  Otherwise he continues to have dyspnea on exertion and shortness of breath.  Occasional wheezing.  He also carries a cup around with him for the amount of chronic sputum that he brings up.  Denies fevers.  But states for the past several weeks he has been feeling much worse.  OV 02/20/2021: Patient here today for follow-up regarding COPD.  Unfortunately Ian [name] is still smoking.  He has been smoking for a long time he was able to cut down to 5 cigarettes a day at the last time we saw him in 2020.  At this point he still smoking about a pack to a pack and 1/2/day.  He is ran out of all of his medications.  He has not been using any inhalers.  He does have a rescue inhaler that he uses on occasion.  Complains of chest tightness and shortness of breath.  Does have daily cough and sputum production.  OV 08/19/2021: Patient here today for follow-up regarding COPD.  Unfortunately he is still smoking.  Still smoking about 5 cigarettes/day.  Story very similar to when we met back in September.  He knows that he needs to quit smoking but its been very difficult for him.  From a respiratory standpoint he is able to maintain.  He does  feel like he has new triple therapy inhaler regimen with Ian [name] is much more effective than what he was on in the past.  He still gets up in the morning with daily cough and sputum production throughout the day is able to do fine until at nighttime also has cough that returns.  OV 02/09/2023: Here today for COPD follow-up.  Unfortunately he is randomly taking his medications.  Does not seem that he is using them every day.  He is still smoking.  He lives alone.  Still complaints of shortness of breath.  I am not sure what else to do at this point if he is not going to take his medications to continue to smoke.    Past Medical History:  Diagnosis Date   Anxiety    COPD (chronic obstructive pulmonary disease) (HCC)    Depression    Hx of cardiovascular stress test    Lexiscan Myoview (09/2013):  No ischemia, EF 62%; NORMAL   Leg pain    ABIs (09/29/13):  Normal.   Stomach ulcer    Stroke (HCC)    Tobacco abuse      Family History  Problem Relation Age of Onset   Heart failure Mother 75       Alive   Diabetes Mother    CAD Father 86       Died of MI   Diabetes Sister    CAD Brother    Cancer Maternal Grandmother        Throat cancer    CAD Paternal Grandfather 74       Died of MI   Heart disease Daughter        Rapid heart rate   Colon cancer Neg Hx      Social History   Socioeconomic History   Marital status: Divorced    Spouse name: Not on file   Number of children: 5   Years of education: Not on file   Highest education level: Not on file  Occupational History    Employer: NOT EMPLOYED  Tobacco Use   Smoking status: Some Days    Current packs/day: 0.25    Average packs/day: 0.3 packs/day for 35.0 years (8.8 ttl pk-yrs)    Types: Cigarettes   Smokeless tobacco: Never   Tobacco comments:    Smokes 2 cigarettes in a day. 02/09/23 Tay  Vaping Use   Vaping status: Never Used  Substance and Sexual Activity   Alcohol use: Not Currently    Alcohol/week: 9.0 - 13.0  standard drinks of alcohol    Types: 1 Glasses of wine, 8 - 12 Cans of beer per week    Comment: Drank beer, for 35 years, quit 5 years ago    Drug use: No   Sexual activity:  Not on file  Other Topics Concern   Not on file  Social History Narrative   Lives with alone.  Cannot read and write.     Social Determinants of Health   Financial Resource Strain: Low Risk  (08/28/2022)   Received from [facility], [facility]   Overall Financial Resource Strain (CARDIA)    Difficulty of Paying Living Expenses: Not hard at all  Food Insecurity: No Food Insecurity (08/28/2022)   Received from [facility], [facility]   Hunger Vital Sign    Worried About Running Out of Food in the Last Year: Never true    Ran Out of Food in the Last Year: Never true  Transportation Needs: No Transportation Needs (08/28/2022)   Received from [facility], [facility]   PRAPARE - Transportation    Lack of Transportation (Medical): No    Lack of Transportation (Non-Medical): No  Physical Activity: Not on file  Stress: Not on file  Social Connections: Unknown (10/15/2021)   Received from [facility], [facility]   Social Network    Social Network: Not on file  Intimate Partner Violence: Unknown (09/19/2021)   Received from [facility], [facility]   HITS    Physically Hurt: Not on file    Insult or Talk Down To: Not on file    Threaten Physical Harm: Not on file    Scream or Curse: Not on file     Allergies  Allergen Reactions   Asa [Aspirin] Itching   Ibuprofen Itching   Tylenol [Acetaminophen] Itching     Outpatient Medications Prior to Visit  Medication Sig Dispense Refill   albuterol (VENTOLIN HFA) 108 (90 Base) MCG/ACT inhaler Inhale 2 puffs into the lungs every 6 (six) hours as needed for wheezing or shortness of breath. 8 g 1   ALPRAZolam (XANAX) 1 MG tablet Take 1 mg by mouth 3 (three) times daily as needed for sleep or anxiety.     atorvastatin (LIPITOR) 40 MG tablet Take 1  tablet (40 mg total) by mouth daily. Please call office to schedule an appt for further refills. Thank you 90 tablet 1   Budeson-Glycopyrrol-Formoterol (BREZTRI AEROSPHERE) 160-9-4.8 MCG/ACT AERO Inhale 2 puffs into the lungs in the morning and at bedtime. 10.7 each 5   cetirizine (ZYRTEC) 10 MG tablet TAKE 1 TABLET BY MOUTH EVERY DAY 90 tablet 2   clopidogrel (PLAVIX) 75 MG tablet Take 75 mg by mouth daily.     dextromethorphan-guaiFENesin (MUCINEX DM) 30-600 MG 12hr tablet Take 1 tablet by mouth 2 (two) times daily as needed for cough. 30 tablet 0   FENOFIBRATE PO Take 135 mg by mouth daily.     fluticasone (FLONASE) 50 MCG/ACT nasal spray Place 1 spray into both nostrils daily. 16 g 2   gabapentin (NEURONTIN) 600 MG tablet Take 600 mg by mouth 2 (two) times daily.     pantoprazole (PROTONIX) 40 MG tablet Take 40 mg by mouth daily.     Roflumilast (DALIRESP) 250 MCG TABS Take 1 tablet by mouth daily. 28 tablet 0   tamsulosin (FLOMAX) 0.4 MG CAPS capsule Take 0.4 mg by mouth daily.      Vitamin D, Ergocalciferol, (DRISDOL) 50000 UNITS CAPS capsule Take 50,000 Units by mouth every 7 (seven) days. Friday     amoxicillin-clavulanate (AUGMENTIN) 875-125 MG tablet Take 1 tablet by mouth 2 (two) times daily. (Patient not taking: Reported on 12/25/2022) 10 tablet 0   lisinopril (ZESTRIL) 10 MG tablet Take 10 mg  by mouth daily.     predniSONE (DELTASONE) 10 MG tablet 4 tabs for 2 days, then 3 tabs for 2 days, 2 tabs for 2 days, then 1 tab for 2 days, then stop (Patient not taking: Reported on 02/09/2023) 20 tablet 0   Budeson-Glycopyrrol-Formoterol (BREZTRI AEROSPHERE) 160-9-4.8 MCG/ACT AERO Inhale 2 puffs into the lungs in the morning and at bedtime. 1 each 0   No facility-administered medications prior to visit.    Review of Systems  Constitutional:  Negative for chills, fever, malaise/fatigue and weight loss.  HENT:  Negative for hearing loss, sore throat and tinnitus.   Eyes:  Negative for blurred  vision and double vision.  Respiratory:  Positive for cough, sputum production and shortness of breath. Negative for hemoptysis, wheezing and stridor.   Cardiovascular:  Negative for chest pain, palpitations, orthopnea, leg swelling and PND.  Gastrointestinal:  Negative for abdominal pain, constipation, diarrhea, heartburn, nausea and vomiting.  Genitourinary:  Negative for dysuria, hematuria and urgency.  Musculoskeletal:  Negative for joint pain and myalgias.  Skin:  Negative for itching and rash.  Neurological:  Negative for dizziness, tingling, weakness and headaches.  Endo/Heme/Allergies:  Negative for environmental allergies. Does not bruise/bleed easily.  Psychiatric/Behavioral:  Negative for depression. The patient is not nervous/anxious and does not have insomnia.   All other systems reviewed and are negative.    Objective:  Physical Exam Vitals reviewed.  Constitutional:      General: He is not in acute distress.    Appearance: He is well-developed. He is obese.  HENT:     Head: Normocephalic and atraumatic.  Eyes:     General: No scleral icterus.    Conjunctiva/sclera: Conjunctivae normal.     Pupils: Pupils are equal, round, and reactive to light.  Neck:     Vascular: No JVD.     Trachea: No tracheal deviation.  Cardiovascular:     Rate and Rhythm: Normal rate and regular rhythm.     Heart sounds: Normal heart sounds. No murmur heard. Pulmonary:     Effort: Pulmonary effort is normal. No tachypnea, accessory muscle usage or respiratory distress.     Breath sounds: No stridor. No wheezing, rhonchi or rales.  Abdominal:     General: There is no distension.     Palpations: Abdomen is soft.     Tenderness: There is no abdominal tenderness.  Musculoskeletal:        General: No tenderness.     Cervical back: Neck supple.  Lymphadenopathy:     Cervical: No cervical adenopathy.  Skin:    General: Skin is warm and dry.     Capillary Refill: Capillary refill takes less  than 2 seconds.     Findings: No rash.  Neurological:     Mental Status: He is alert and oriented to person, place, and time.  Psychiatric:        Behavior: Behavior normal.      Vitals:   02/09/23 0935  BP: 100/62  Pulse: 79  SpO2: 96%  Weight: 220 lb 6.4 oz (100 kg)  Height: 5\' 9"  (1.753 m)    96% on RA BMI Readings from Last 3 Encounters:  02/09/23 32.55 kg/m  12/25/22 32.67 kg/m  11/18/22 32.16 kg/m   Wt Readings from Last 3 Encounters:  02/09/23 220 lb 6.4 oz (100 kg)  12/25/22 221 lb 3.2 oz (100.3 kg)  11/18/22 217 lb 12.8 oz (98.8 kg)    CBC    Component Value Date/Time  WBC 8.2 10/31/2022 1020   RBC 5.22 10/31/2022 1020   HGB 15.7 10/31/2022 1020   HCT 46.9 10/31/2022 1020   PLT 282.0 10/31/2022 1020   MCV 89.9 10/31/2022 1020   MCH 31.5 07/25/2021 1909   MCHC 33.5 10/31/2022 1020   RDW 13.5 10/31/2022 1020   LYMPHSABS 1.6 10/31/2022 1020   MONOABS 0.8 10/31/2022 1020   EOSABS 0.2 10/31/2022 1020   BASOSABS 0.1 10/31/2022 1020    Chest Imaging:  11/07/2014 IMPRESSION: No active cardiopulmonary disease.  02/18/2018: CT chest No acute cardiopulmonary disease, evidence of centrilobular emphysema The patient's images have been independently reviewed by me.    07/06/2019 lung cancer screening CT: Lung RADS 1, no concerning lesion. The patient's images have been independently reviewed by me.    CT chest May 2024: No PE.  Evidence of emphysema. The patient's images have been independently reviewed by me.     Pulmonary Functions Testing Results: 03/12/2018: Postbronchodilator responses recorded FVC 3.12 L 71% predicted FEV1 2.4 L 71% predicted, 20% bronchodilator response Ratio 77 SVC 69% TLC 68% RV/TLC ratio 125% ERV 16% DLCO 59%     Latest Ref Rng & Units 03/12/2018    3:03 PM  PFT Results  FVC-Pre L 2.68   FVC-Predicted Pre % 61   FVC-Post L 3.12   FVC-Predicted Post % 71   Pre FEV1/FVC % % 74   Post FEV1/FCV % % 77   FEV1-Pre L  1.99   FEV1-Predicted Pre % 59   FEV1-Post L 2.40   DLCO uncorrected ml/min/mmHg 16.81   DLCO UNC% % 59   DLVA Predicted % 79      FeNO: None   Pathology: None   Echocardiogram:   Study Conclusions - Left ventricle: Technically difficult study. The cavity size was   normal. Wall thickness was increased in a pattern of mild LVH.   The estimated ejection fraction was 60%. Wall motion was normal;   there were no regional wall motion abnormalities. - Right ventricle: The cavity size was normal. Systolic function   was normal. - Impressions: No cardiac source of embolism was identified, but   cannot be ruled out on the basis of this examination. Impressions: - No cardiac source of embolism was identified, but cannot be ruled   out on the basis of this examination.  Heart Catheterization: None     Assessment & Plan:     ICD-10-CM   1. Chronic obstructive pulmonary disease, unspecified COPD type (HCC)  J44.9     2. Tobacco abuse  Z72.0     3. Current smoker  F17.200       Discussion:  This is a 62 year old gentleman current smoker, PFTs with mixed restrictive and obstructive disease, evidence of air trapping, reduced DLCO.  Plan: Continue triple therapy Albuterol for shortness of breath and wheezing Continue enrollment in lung cancer screening program. He needs to quit smoking. I am not sure what else were going to be able to do to control his symptoms if he continues to smoke despite inhalers. He is also not using his inhalers regularly. Encouraged him to maintain at least daily use.    Current Outpatient Medications:    albuterol (VENTOLIN HFA) 108 (90 Base) MCG/ACT inhaler, Inhale 2 puffs into the lungs every 6 (six) hours as needed for wheezing or shortness of breath., Disp: 8 g, Rfl: 1   ALPRAZolam (XANAX) 1 MG tablet, Take 1 mg by mouth 3 (three) times daily as needed for sleep or  anxiety., Disp: , Rfl:    atorvastatin (LIPITOR) 40 MG tablet, Take 1 tablet (40  mg total) by mouth daily. Please call office to schedule an appt for further refills. Thank you, Disp: 90 tablet, Rfl: 1   Budeson-Glycopyrrol-Formoterol (BREZTRI AEROSPHERE) 160-9-4.8 MCG/ACT AERO, Inhale 2 puffs into the lungs in the morning and at bedtime., Disp: 10.7 each, Rfl: 5   cetirizine (ZYRTEC) 10 MG tablet, TAKE 1 TABLET BY MOUTH EVERY DAY, Disp: 90 tablet, Rfl: 2   clopidogrel (PLAVIX) 75 MG tablet, Take 75 mg by mouth daily., Disp: , Rfl:    dextromethorphan-guaiFENesin (MUCINEX DM) 30-600 MG 12hr tablet, Take 1 tablet by mouth 2 (two) times daily as needed for cough., Disp: 30 tablet, Rfl: 0   FENOFIBRATE PO, Take 135 mg by mouth daily., Disp: , Rfl:    fluticasone (FLONASE) 50 MCG/ACT nasal spray, Place 1 spray into both nostrils daily., Disp: 16 g, Rfl: 2   gabapentin (NEURONTIN) 600 MG tablet, Take 600 mg by mouth 2 (two) times daily., Disp: , Rfl:    pantoprazole (PROTONIX) 40 MG tablet, Take 40 mg by mouth daily., Disp: , Rfl:    Roflumilast (DALIRESP) 250 MCG TABS, Take 1 tablet by mouth daily., Disp: 28 tablet, Rfl: 0   tamsulosin (FLOMAX) 0.4 MG CAPS capsule, Take 0.4 mg by mouth daily. , Disp: , Rfl:    Vitamin D, Ergocalciferol, (DRISDOL) 50000 UNITS CAPS capsule, Take 50,000 Units by mouth every 7 (seven) days. Friday, Disp: , Rfl:    amoxicillin-clavulanate (AUGMENTIN) 875-125 MG tablet, Take 1 tablet by mouth 2 (two) times daily. (Patient not taking: Reported on 12/25/2022), Disp: 10 tablet, Rfl: 0   lisinopril (ZESTRIL) 10 MG tablet, Take 10 mg by mouth daily., Disp: , Rfl:    predniSONE (DELTASONE) 10 MG tablet, 4 tabs for 2 days, then 3 tabs for 2 days, 2 tabs for 2 days, then 1 tab for 2 days, then stop (Patient not taking: Reported on 02/09/2023), Disp: 20 tablet, Rfl: 0   [name], DO [facility] 02/09/2023 10:10 AM

## 2023-02-09 NOTE — Patient Instructions (Addendum)
Thank you for visiting Dr. Tonia Brooms at [facility]. Today we recommend the following:  Continue breztri  Stop smoking   Return in about 1 year (around 02/09/2024) for with APP or Dr. Tonia Brooms.    Please do your part to reduce the spread of COVID-19.

## 2023-02-23 ENCOUNTER — Other Ambulatory Visit: Payer: Self-pay | Admitting: Primary Care

## 2023-03-23 ENCOUNTER — Ambulatory Visit (HOSPITAL_COMMUNITY)
Admission: RE | Admit: 2023-03-23 | Discharge: 2023-03-23 | Disposition: A | Discharge status: Home / Self Care | Payer: Medicaid Other | Source: Ambulatory Visit | Attending: Family Medicine | Admitting: Family Medicine

## 2023-03-23 DIAGNOSIS — F17210 Nicotine dependence, cigarettes, uncomplicated: Secondary | ICD-10-CM | POA: Diagnosis present

## 2023-03-23 DIAGNOSIS — Z122 Encounter for screening for malignant neoplasm of respiratory organs: Secondary | ICD-10-CM | POA: Insufficient documentation

## 2023-03-23 DIAGNOSIS — Z87891 Personal history of nicotine dependence: Secondary | ICD-10-CM | POA: Diagnosis present

## 2023-04-08 ENCOUNTER — Other Ambulatory Visit: Payer: Self-pay | Admitting: Acute Care

## 2023-04-08 DIAGNOSIS — Z87891 Personal history of nicotine dependence: Secondary | ICD-10-CM

## 2023-04-08 DIAGNOSIS — F17210 Nicotine dependence, cigarettes, uncomplicated: Secondary | ICD-10-CM

## 2023-04-08 DIAGNOSIS — Z122 Encounter for screening for malignant neoplasm of respiratory organs: Secondary | ICD-10-CM

## 2023-08-02 ENCOUNTER — Other Ambulatory Visit: Payer: Self-pay | Admitting: Cardiology

## 2023-08-02 DIAGNOSIS — E785 Hyperlipidemia, unspecified: Secondary | ICD-10-CM

## 2023-08-04 NOTE — Telephone Encounter (Signed)
 Patient has requested atorvastatin (LIPITOR) 40 MG tablet. Last office visit was 03/03/20. Patient has already had 3 refill request and no appointment scheduled.

## 2023-08-07 ENCOUNTER — Other Ambulatory Visit: Payer: Self-pay

## 2023-08-07 NOTE — Telephone Encounter (Signed)
 Called [facility] and spoke with pharmacy tech. Informed her that we have not seen the patient since 2021 and have sent 3rd and final  messages via pharmacy refills that he has to make an appointment with this office. Notified them that they would need to contact his PCP for any further refills as not considered a current patient due to not being seen in 4 years.

## 2023-09-13 ENCOUNTER — Other Ambulatory Visit: Payer: Self-pay | Admitting: Primary Care

## 2023-10-29 ENCOUNTER — Ambulatory Visit (INDEPENDENT_AMBULATORY_CARE_PROVIDER_SITE_OTHER)

## 2023-10-29 ENCOUNTER — Encounter: Payer: Self-pay | Admitting: Primary Care

## 2023-10-29 ENCOUNTER — Ambulatory Visit: Admitting: Primary Care

## 2023-10-29 VITALS — BP 128/60 | HR 66 | Temp 98.1°F | Ht 69.0 in | Wt 217.2 lb

## 2023-10-29 DIAGNOSIS — D170 Benign lipomatous neoplasm of skin and subcutaneous tissue of head, face and neck: Secondary | ICD-10-CM | POA: Diagnosis not present

## 2023-10-29 DIAGNOSIS — R042 Hemoptysis: Secondary | ICD-10-CM

## 2023-10-29 DIAGNOSIS — I700 Atherosclerosis of aorta: Secondary | ICD-10-CM

## 2023-10-29 DIAGNOSIS — J449 Chronic obstructive pulmonary disease, unspecified: Secondary | ICD-10-CM | POA: Diagnosis not present

## 2023-10-29 LAB — D-DIMER, QUANTITATIVE: D-Dimer, Quant: 0.49 ug{FEU}/mL (ref <0.50)

## 2023-10-29 MED ORDER — ROFLUMILAST 500 MCG PO TABS: 500.0000 ug | ORAL_TABLET | Freq: Every day | ORAL | 11 refills | Status: AC

## 2023-10-29 MED ORDER — BREZTRI AEROSPHERE 160-9-4.8 MCG/ACT IN AERO: 2.0000 | INHALATION_SPRAY | Freq: Two times a day (BID) | RESPIRATORY_TRACT | 5 refills | Status: DC

## 2023-10-29 MED ORDER — ALBUTEROL SULFATE HFA 108 (90 BASE) MCG/ACT IN AERS
1.0000 | INHALATION_SPRAY | RESPIRATORY_TRACT | 1 refills | Status: AC | PRN
Start: 2023-10-29 — End: ?

## 2023-10-29 NOTE — Patient Instructions (Addendum)
 -  EMPHYSEMA: Emphysema is a lung condition that causes shortness of breath due to damage to the air sacs in the lungs, often from smoking. We will order a chest x-ray to check your lungs, refill your rifumilast prescription, and encourage you to continue using your Breztri  and albuterol  inhalers. Please continue to work on quitting smoking.  -CHRONIC OBSTRUCTIVE PULMONARY DISEASE (COPD): COPD is a chronic inflammatory lung disease that obstructs airflow from the lungs. We will continue your Breztri  inhaler, resume rifumilast to prevent flare-ups, and advise you to keep using Mucinex  and albuterol  as needed. Quitting smoking is highly recommended.  -HEMOPTYSIS: Hemoptysis is the coughing up of blood from the respiratory tract. We will order a chest x-ray and a D-dimer test to investigate the cause of your symptoms.  -ATHEROSCLEROSIS: Atherosclerosis is the build-up of fats, cholesterol, and other substances in and on the artery walls. Continue taking atorvastatin  and Plavix  as prescribed.  -LIPOMA OF SCALP: A lipoma is a slow-growing, fatty lump that's most often situated between your skin and the underlying muscle layer. We will refer you to general surgery for evaluation and possible removal of the fatty tumor on your head.  INSTRUCTIONS: Please follow up with the chest x-ray and D-dimer test as soon as possible. We will also arrange a referral to a cardiologist for your atherosclerosis and to dermatology or general surgery for the evaluation of the bump on your head. Continue taking your medications as prescribed and work on quitting smoking. If you experience any worsening symptoms, please contact our office immediately.  Orders: CXR and labs today  Referral: General surgery - fatty tumor   Follow-up 3 months Dr. Baldwin Levee (30 min visit- former Dr. Thelda Finney patient)

## 2023-10-29 NOTE — Progress Notes (Signed)
 @Patient  ID: Ian [name], male    DOB: 12-May-1961, 63 y.o.   MRN: [id number]  Chief Complaint  Patient presents with   Follow-up    Sob-occass., cough-yellow with wheezing    Referring provider: Sallye Crease, NP  HPI:  Initial office visit: PMH of tobacco abuse and CVA. He was recently started on an inhaler, twice in the morning and twice at night. He is still having trouble breathing.   He has been smoking since 63 years old, and by teenager he was smoking regularly. He is currently down to a pack per day. At his max he was smoking 2-3 packs per day, >50 pack year history. He has never had PFTs before or seen a pulmonologist. His biggest trouble with his breathing is with exertion. He occasionally has chest tightness with exertion. He admits to chest pain behind his sternum with climbing up a hill.  His chest pain is associated with shortness of breath.  His shortness of breath is also associated with cough in the morning and cough at night.  He does have mild amount of sputum production that is usually whitish in color.  He wheezes on occasion.  After being started on his inhaler Symbicort  2 puffs twice daily he does feel better.  FMHX father with MI, grandfather with MI and brother with CAD s/p CABGX5.   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  OV 03/12/2018: He is doing some better. He is still smoking. He had PFTs completed today, appears to have a mixed restrictive and obstructive defect. He feels like the inhalers are working. He still has dyspnea on exertion.  He does have breathlessness with climbing stairs or climbing he will.  He is cough may be some better.  He does wake up in the morning with sputum production which is yellowish in color.  He denies hemoptysis.  I reviewed recent CT imaging with the patient.  There is evidence of centrilobular emphysema.  No found etiology of his  weight loss.  The patient does admit that he difficulty with reading.  And has struggled with his medications at home and being able to read and interpret the instructions on the bottles inhalers.  He believes that he is doing them correctly however.  OV 01/27/2019: Patient here today for follow-up of COPD.  Last seen by me back in September.  Otherwise doing well.  Unfortunately has been unable to purchase his medications due to cost.  Patient also has literacy problems and has much difficulty understanding when to use his medications.  We will try to simplify his regimen today.  He is very concerned that he could develop lung cancer at some point.  Unfortunately he is still smoking around 5 cigarettes/day.  He knows that he needs to quit but he is having much difficulty with that as well.  Otherwise he continues to have dyspnea on exertion and shortness of breath.  Occasional wheezing.  He also carries a cup around with him for the amount of chronic sputum that he brings up.  Denies fevers.  But states for the past several weeks he has been feeling much worse.  OV 02/20/2021: Patient here today for follow-up regarding COPD.  Unfortunately Ian [name] is still smoking.  He has been smoking for a long time he was able to cut down to 5 cigarettes a day at the last time we saw him in  2020.  At this point he still smoking about a pack to a pack and 1/2/day.  He is ran out of all of his medications.  He has not been using any inhalers.  He does have a rescue inhaler that he uses on occasion.  Complains of chest tightness and shortness of breath.  Does have daily cough and sputum production.  OV 08/19/2021: Patient here today for follow-up regarding COPD.  Unfortunately he is still smoking.  Still smoking about 5 cigarettes/day.  Story very similar to when we met back in September.  He knows that he needs to quit smoking but its been very difficult for him.  From a respiratory standpoint he is able to maintain.  He does  feel like he has new triple therapy inhaler regimen with Breztri  is much more effective than what he was on in the past.  He still gets up in the morning with daily cough and sputum production throughout the day is able to do fine until at nighttime also has cough that returns.  OV 02/09/2023: Here today for COPD follow-up.  Unfortunately he is randomly taking his medications.  Does not seem that he is using them every day.  He is still smoking.  He lives alone.  Still complaints of shortness of breath.  I am not sure what else to do at this point if he is not going to take his medications to continue to smoke.      10/29/2023- Interim hx  Discussed the use of AI scribe software for clinical note transcription with the patient, who gave verbal consent to proceed.  History of Present Illness   Ian [name] is a 63 year old male with emphysema who presents with worsening shortness of breath and hemoptysis.  He has experienced worsening shortness of breath, particularly noticeable during activities such as walking or talking, necessitating frequent pauses to catch his breath. He also experiences chest pain, particularly on the right side, which occurs during physical exertion such as tying his shoes.  Over the past two weeks, he has intermittently coughed up blood, with the last episode occurring around April 28th. Initially, the sputum was mixed with blood, but the amount of blood has since decreased.  He is currently using a Breztri  inhaler, two pumps in the morning and evening, and a rescue inhaler as needed. However, he has not been taking Daliresp  due to a lack of pharmacy notification for refills. He mentions not having refills for his rescue inhaler.  He has a palpable bump on his head, which he recalls getting hit once. It is described as mobile and about the size of a thumb. It has not been formally evaluated.  He has significantly reduced his smoking to about a pack a month, having  started smoking at a young age. He is not currently working and engages in minimal physical activity, limited by his respiratory symptoms.  He is taking Plavix  and atorvastatin  for history of aortic atherosclerosis. He was last seen by cardiology 3 years ago and told to return as needed.    Allergies  Allergen Reactions   Anders Ban ] Itching   Ibuprofen Itching   Tylenol  [Acetaminophen ] Itching    Immunization History  Administered Date(s) Administered   Dtap, Unspecified 11/14/1964   Influenza Inj Mdck Quad Pf 05/27/2021, 05/29/2022   Influenza, Seasonal, Injecte, Preservative Fre 03/19/2015   Influenza-Unspecified 05/27/2021   Janssen (J&J) SARS-COV-2 Vaccination 11/24/2019   PFIZER(Purple Top)SARS-COV-2 Vaccination 05/17/2020   Pneumococcal Polysaccharide-23 01/27/2017  Polio, Unspecified 10/03/1964, 11/14/1964   Tdap 07/05/2005, 10/22/2015    Past Medical History:  Diagnosis Date   Anxiety    COPD (chronic obstructive pulmonary disease) (HCC)    Depression    Hx of cardiovascular stress test    Lexiscan  Myoview (09/2013):  No ischemia, EF 62%; NORMAL   Leg pain    ABIs (09/29/13):  Normal.   Stomach ulcer    Stroke (HCC)    Tobacco abuse     Tobacco History: Social History   Tobacco Use  Smoking Status Some Days   Current packs/day: 0.25   Average packs/day: 0.3 packs/day for 35.0 years (8.8 ttl pk-yrs)   Types: Cigarettes  Smokeless Tobacco Never  Tobacco Comments   Smokes 2 cigarettes in a day. 02/09/23 Tay   Ready to quit: Not Answered Counseling given: Not Answered Tobacco comments: Smokes 2 cigarettes in a day. 02/09/23 Tay   Outpatient Medications Prior to Visit  Medication Sig Dispense Refill   ALPRAZolam  (XANAX ) 1 MG tablet Take 1 mg by mouth 3 (three) times daily as needed for sleep or anxiety.     atorvastatin  (LIPITOR) 40 MG tablet Take 1 tablet (40 mg total) by mouth daily. Please call office to schedule an appt for further refills. Thank you  90 tablet 1   Budeson-Glycopyrrol-Formoterol  (BREZTRI  AEROSPHERE) 160-9-4.8 MCG/ACT AERO Inhale 2 puffs into the lungs in the morning and at bedtime. 10.7 each 5   cetirizine  (ZYRTEC ) 10 MG tablet TAKE 1 TABLET BY MOUTH EVERY DAY 90 tablet 2   clopidogrel  (PLAVIX ) 75 MG tablet Take 75 mg by mouth daily.     dextromethorphan -guaiFENesin  (MUCINEX  DM) 30-600 MG 12hr tablet Take 1 tablet by mouth 2 (two) times daily as needed for cough. 30 tablet 0   FENOFIBRATE  PO Take 135 mg by mouth daily.     fluticasone  (FLONASE ) 50 MCG/ACT nasal spray Place 1 spray into both nostrils daily. 16 g 2   gabapentin  (NEURONTIN ) 600 MG tablet Take 600 mg by mouth 2 (two) times daily.     pantoprazole  (PROTONIX ) 40 MG tablet Take 40 mg by mouth daily.     Roflumilast  250 MCG TABS TAKE 1 TABLET BY MOUTH EVERY DAY 28 tablet 0   tamsulosin (FLOMAX) 0.4 MG CAPS capsule Take 0.4 mg by mouth daily.      VENTOLIN  HFA 108 (90 Base) MCG/ACT inhaler INHALE 2 PUFFS INTO THE LUNGS EVERY 6 HOURS AS NEEDED FOR WHEEZE OR SHORTNESS OF BREATH 18 each 1   Vitamin D , Ergocalciferol , (DRISDOL ) 50000 UNITS CAPS capsule Take 50,000 Units by mouth every 7 (seven) days. Friday     amoxicillin -clavulanate (AUGMENTIN ) 875-125 MG tablet Take 1 tablet by mouth 2 (two) times daily. (Patient not taking: Reported on 10/29/2023) 10 tablet 0   lisinopril (ZESTRIL) 10 MG tablet Take 10 mg by mouth daily.     predniSONE  (DELTASONE ) 10 MG tablet 4 tabs for 2 days, then 3 tabs for 2 days, 2 tabs for 2 days, then 1 tab for 2 days, then stop (Patient not taking: Reported on 10/29/2023) 20 tablet 0   No facility-administered medications prior to visit.    Review of Systems  Review of Systems  Constitutional: Negative.   HENT:  Positive for congestion.   Respiratory:  Positive for cough, shortness of breath and wheezing.   Cardiovascular:  Positive for chest pain. Negative for palpitations and leg swelling.   Physical Exam  BP 128/60 (BP Location:  Right Arm, Patient Position: Sitting, Cuff  Size: Large)   Pulse 66   Temp 98.1 F (36.7 C) (Oral)   Ht 5\' 9"  (1.753 m)   Wt 217 lb 3.2 oz (98.5 kg)   SpO2 93%   BMI 32.07 kg/m  Physical Exam Constitutional:      General: He is not in acute distress.    Appearance: Normal appearance. He is not ill-appearing.  HENT:     Mouth/Throat:     Mouth: Mucous membranes are moist.     Pharynx: Oropharynx is clear.  Cardiovascular:     Rate and Rhythm: Normal rate and regular rhythm.  Pulmonary:     Effort: Pulmonary effort is normal.     Breath sounds: Normal breath sounds.     Comments: CTA; exp wheeze with cough Musculoskeletal:        General: Normal range of motion.  Skin:    General: Skin is warm and dry.     Comments: Soft, mobile nodule to right scalp  Neurological:     General: No focal deficit present.     Mental Status: He is alert and oriented to person, place, and time. Mental status is at baseline.  Psychiatric:        Mood and Affect: Mood normal.        Behavior: Behavior normal.        Thought Content: Thought content normal.        Judgment: Judgment normal.      Lab Results:  CBC    Component Value Date/Time   WBC 8.2 10/31/2022 1020   RBC 5.22 10/31/2022 1020   HGB 15.7 10/31/2022 1020   HCT 46.9 10/31/2022 1020   PLT 282.0 10/31/2022 1020   MCV 89.9 10/31/2022 1020   MCH 31.5 07/25/2021 1909   MCHC 33.5 10/31/2022 1020   RDW 13.5 10/31/2022 1020   LYMPHSABS 1.6 10/31/2022 1020   MONOABS 0.8 10/31/2022 1020   EOSABS 0.2 10/31/2022 1020   BASOSABS 0.1 10/31/2022 1020    BMET    Component Value Date/Time   NA 139 10/31/2022 1020   NA 141 12/13/2019 0844   K 4.0 10/31/2022 1020   CL 102 10/31/2022 1020   CO2 31 10/31/2022 1020   GLUCOSE 105 (H) 10/31/2022 1020   BUN 12 10/31/2022 1020   BUN 11 12/13/2019 0844   CREATININE 0.90 10/31/2022 1020   CALCIUM  9.4 10/31/2022 1020   GFRNONAA >60 07/25/2021 1909   GFRAA 102 12/13/2019 0844     BNP No results found for: "BNP"  ProBNP No results found for: "PROBNP"  Imaging: No results found.   Assessment & Plan:   1. Lipoma of head - Ambulatory referral to General Surgery  2. Hemoptysis - DG Chest 2 View; Future - D-Dimer, Quantitative; Future - D-Dimer, Quantitative  3. Aortic atherosclerosis (HCC)  4. Chronic obstructive pulmonary disease, unspecified COPD type (HCC) (Primary) - DG Chest 2 View; Future  Assessment and Plan    Emphysema Emphysema with obstructive lung disease due to smoking, presenting with shortness of breath, cough, and occasional hemoptysis. Increased dyspnea on exertion reported. Previous CT scan indicated emphysema and smoking-related bronchiolitis without suspicious pulmonary nodules. - Order chest x-ray to evaluate current pulmonary status. - Refill rifumilast (Daliresp ) to prevent COPD flare-ups. - Encourage continued use of Breztri  inhaler and albuterol  as rescue inhaler. - Continue Mucinex  and albuterol  as needed. - Encourage smoking cessation.  Hemoptysis Intermittent hemoptysis, last episode two weeks ago, associated with cough and dyspnea. No recent increase in severity. -  Order chest x-ray to evaluate for potential causes of hemoptysis. - Order D-dimer test due to hemoptysis and pleuritic chest pain.  Atherosclerosis Atherosclerosis with plaque build-up on previous imaging. No current cardiology follow-up. Managed with atorvastatin  and Plavix .  Lipoma of scalp Mobile scalp nodule suspected to be a lipoma, with a history of trauma to the area. No current pain reported. - Refer to general surgery for evaluation and possible excision of scalp lipoma.   Antonio Baumgarten, NP 10/29/2023

## 2023-10-30 ENCOUNTER — Ambulatory Visit: Payer: Self-pay | Admitting: Primary Care

## 2023-10-30 NOTE — Progress Notes (Signed)
 CXR showed stable bronchitic changes related to COPD. No evidence of pneumonia. Sent message as well D-dimer was negative

## 2023-11-26 ENCOUNTER — Encounter: Payer: Self-pay | Admitting: Surgery

## 2023-11-26 ENCOUNTER — Ambulatory Visit: Admitting: Surgery

## 2023-11-26 VITALS — BP 121/72 | HR 61 | Temp 97.8°F | Resp 18 | Ht 69.0 in | Wt 212.0 lb

## 2023-11-26 DIAGNOSIS — D170 Benign lipomatous neoplasm of skin and subcutaneous tissue of head, face and neck: Secondary | ICD-10-CM | POA: Diagnosis not present

## 2023-11-26 NOTE — Progress Notes (Signed)
 [facility] History and Physical  Reason for Referral: Head lipoma Referring Physician: Eulas Hick, NP  Chief Complaint   New Patient (Initial Visit)     Ian [name] is a 63 y.o. male.  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  He states that he sustained trauma to the area, and then the mass started to increase in size.  He first noticed the area about 19 years ago.  He believes that the lump is causing headaches and left eye bulging for him.  He denies any pain at the actual mass site.  He states that it will intermittently get inflamed, and this has happened over the last 14 to 15 years.  He denies any drainage from the area.  He denies fevers and chills.  He also denies any other lumps or bumps anywhere else on his body.  His past medical history is significant for stroke on Plavix , COPD on inhalers but not currently on oxygen, hypertension, hyperlipidemia, neuropathy, GERD, and depression/anxiety.  He denies ever having any surgeries.  He quit smoking 2 to 3 weeks ago, but previously was smoking up to 5 packs of cigarettes per day.  He denies use of alcohol products and illicit drugs.  Past Medical History:  Diagnosis Date   Anxiety    COPD (chronic obstructive pulmonary disease) (HCC)    Depression    Hx of cardiovascular stress test    Lexiscan  Myoview (09/2013):  No ischemia, EF 62%; NORMAL   Leg pain    ABIs (09/29/13):  Normal.   Stomach ulcer    Stroke (HCC)    Tobacco abuse     Past Surgical History:  Procedure Laterality Date   ANKLE FRACTURE SURGERY     APPENDECTOMY     BALLOON DILATION N/A 09/09/2021   Procedure: BALLOON DILATION;  Surgeon: Vinetta Greening, DO;  [facility];  Service: Endoscopy;  Laterality: N/A;   BIOPSY  09/09/2021   Procedure: BIOPSY;  Surgeon: Vinetta Greening, DO;  Location: [facility];  Service: Endoscopy;;   COLONOSCOPY  04/2021   3mm rectal polyp removed   ESOPHAGOGASTRODUODENOSCOPY  (EGD) WITH PROPOFOL  N/A 09/09/2021   Procedure: ESOPHAGOGASTRODUODENOSCOPY (EGD) WITH PROPOFOL ;  Surgeon: Vinetta Greening, DO;  Location: [facility];  Service: Endoscopy;  Laterality: N/A;  9:30am    Family History  Problem Relation Age of Onset   Heart failure Mother 25       Alive   Diabetes Mother    CAD Father 54       Died of MI   Diabetes Sister    CAD Brother    Cancer Maternal Grandmother        Throat cancer    CAD Paternal Grandfather 32       Died of MI   Heart disease Daughter        Rapid heart rate   Colon cancer Neg Hx     Social History   Tobacco Use   Smoking status: Former    Current packs/day: 0.00    Average packs/day: 0.3 packs/day for 35.0 years (8.8 ttl pk-yrs)    Types: Cigarettes    Quit date: 11/2023    Years since quitting: 0.0   Smokeless tobacco: Never   Tobacco comments:    Smokes 2 cigarettes in a day. 02/09/23 Tay  Vaping Use   Vaping status: Never Used  Substance Use Topics   Alcohol use: Not Currently    Alcohol/week:  9.0 - 13.0 standard drinks of alcohol    Types: 1 Glasses of wine, 8 - 12 Cans of beer per week    Comment: Drank beer, for 35 years, quit 5 years ago    Drug use: No    Medications: I have reviewed the patient's current medications. Allergies as of 11/26/2023       Reactions   Asa [aspirin ] Itching   Ibuprofen Itching   Tylenol  [acetaminophen ] Itching        Medication List        Accurate as of November 26, 2023  9:03 AM. If you have any questions, ask your nurse or doctor.          STOP taking these medications    cetirizine  10 MG tablet Commonly known as: ZYRTEC  Stopped by: Ian [name]   dextromethorphan -guaiFENesin  30-600 MG 12hr tablet Commonly known as: MUCINEX  DM Stopped by: Ian [name]   fluticasone  50 MCG/ACT nasal spray Commonly known as: FLONASE  Stopped by: Ian [name]   gabapentin  600 MG tablet Commonly known as: NEURONTIN  Stopped by: Ian [name]       TAKE these medications    albuterol  108 (90 Base) MCG/ACT inhaler Commonly known as: Ventolin  HFA Inhale 1-2 puffs into the lungs every 4 (four) hours as needed for wheezing or shortness of breath.   ALPRAZolam  1 MG tablet Commonly known as: XANAX  Take 1 mg by mouth 3 (three) times daily as needed for sleep or anxiety.   atorvastatin  40 MG tablet Commonly known as: LIPITOR Take 1 tablet (40 mg total) by mouth daily. Please call office to schedule an appt for further refills. Thank you   Breztri  Aerosphere 160-9-4.8 MCG/ACT Aero inhaler Generic drug: budesonide -glycopyrrolate-formoterol  Inhale 2 puffs into the lungs in the morning and at bedtime.   clopidogrel  75 MG tablet Commonly known as: PLAVIX  Take 75 mg by mouth daily.   FENOFIBRATE  PO Take 135 mg by mouth daily.   lisinopril 10 MG tablet Commonly known as: ZESTRIL Take 10 mg by mouth daily.   pantoprazole  40 MG tablet Commonly known as: PROTONIX  Take 40 mg by mouth daily.   roflumilast  500 MCG Tabs tablet Commonly known as: DALIRESP  Take 1 tablet (500 mcg total) by mouth daily.   tamsulosin 0.4 MG Caps capsule Commonly known as: FLOMAX Take 0.4 mg by mouth daily.   Vitamin D  (Ergocalciferol ) 1.25 MG (50000 UNIT) Caps capsule Commonly known as: DRISDOL  Take 50,000 Units by mouth every 7 (seven) days. Friday         ROS:  Constitutional: negative for chills, fatigue, and fevers Eyes: negative for visual disturbance and pain Ears, nose, mouth, throat, and face: negative for ear drainage, sore mouth, and sinus problems Respiratory: negative for cough, wheezing, and shortness of breath Cardiovascular: negative for chest pain and palpitations Gastrointestinal: negative for abdominal pain, nausea, reflux symptoms, and vomiting Genitourinary:negative for dysuria and frequency Integument/breast: negative for dryness and rash Hematologic/lymphatic: negative for bleeding and  lymphadenopathy Musculoskeletal:positive for back pain, negative for neck pain Neurological: negative for dizziness and tremors Endocrine: negative for temperature intolerance  Blood pressure 121/72, pulse 61, temperature 97.8 F (36.6 C), temperature source Oral, resp. rate 18, height 5' 9 (1.753 m), weight 212 lb (96.2 kg), SpO2 91%. Physical Exam Vitals reviewed.  Constitutional:      Appearance: Normal appearance.  HENT:     Head: Normocephalic and atraumatic.     Comments: 3 to 4 cm soft and mobile mass on  the right lateral scalp, nontender to palpation, no overlying skin changes  Eyes:     Extraocular Movements: Extraocular movements intact.     Pupils: Pupils are equal, round, and reactive to light.    Cardiovascular:     Rate and Rhythm: Normal rate and regular rhythm.  Pulmonary:     Effort: Pulmonary effort is normal.     Breath sounds: Normal breath sounds.  Abdominal:     General: There is no distension.     Palpations: Abdomen is soft.     Tenderness: There is no abdominal tenderness.   Musculoskeletal:        General: Normal range of motion.     Cervical back: Normal range of motion.   Skin:    General: Skin is warm and dry.   Neurological:     General: No focal deficit present.     Mental Status: He is alert and oriented to person, place, and time.   Psychiatric:        Mood and Affect: Mood normal.        Behavior: Behavior normal.     Results: No results found for this or any previous visit (from the past 48 hours).  No results found.   Assessment & Plan:  CHAZE HRUSKA is a 63 y.o. male who presents for evaluation of a head lipoma.  -We discussed the pathophysiology of lipomas, and the recommendation for surgical excision.  I discussed that if the area is bothering the patient, we can proceed with removal of the lipoma.  I did discuss that it is very unlikely that this lipoma is causing his headaches or his left eye bulging -The risk and  benefits of excision of head lipoma were discussed including but not limited to bleeding, infection, injury to surrounding structures, need for additional procedures, and lipoma recurrence.  After careful consideration, DONTAVIS TSCHANTZ has decided to proceed with removal.  -Patient tentatively scheduled for lipoma excision in the minor procedure room at the hospital on 7/2 -Advised that he is going to need his hold his Plavix  for 5 days prior to the procedure -Information provided to the patient regarding lipomas  All questions were answered to the satisfaction of the patient and family.  Note: Portions of this report may have been transcribed using voice recognition software. Every effort has been made to ensure accuracy; however, inadvertent computerized transcription errors may still be present.   Lidia Reels, DO [facility] [address] [place], [place] [address] [phone] (office)

## 2023-11-27 NOTE — H&P (Signed)
 [facility] History and Physical  Reason for Referral: Head lipoma Referring Physician: Eulas Hick, NP  Chief Complaint   New Patient (Initial Visit)     Ian [name] is a 63 y.o. male.  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  He states that he sustained trauma to the area, and then the mass started to increase in size.  He first noticed the area about 19 years ago.  He believes that the lump is causing headaches and left eye bulging for him.  He denies any pain at the actual mass site.  He states that it will intermittently get inflamed, and this has happened over the last 14 to 15 years.  He denies any drainage from the area.  He denies fevers and chills.  He also denies any other lumps or bumps anywhere else on his body.  His past medical history is significant for stroke on Plavix , COPD on inhalers but not currently on oxygen, hypertension, hyperlipidemia, neuropathy, GERD, and depression/anxiety.  He denies ever having any surgeries.  He quit smoking 2 to 3 weeks ago, but previously was smoking up to 5 packs of cigarettes per day.  He denies use of alcohol products and illicit drugs.  Past Medical History:  Diagnosis Date   Anxiety    COPD (chronic obstructive pulmonary disease) (HCC)    Depression    Hx of cardiovascular stress test    Lexiscan  Myoview (09/2013):  No ischemia, EF 62%; NORMAL   Leg pain    ABIs (09/29/13):  Normal.   Stomach ulcer    Stroke (HCC)    Tobacco abuse     Past Surgical History:  Procedure Laterality Date   ANKLE FRACTURE SURGERY     APPENDECTOMY     BALLOON DILATION N/A 09/09/2021   Procedure: BALLOON DILATION;  Surgeon: Vinetta Greening, DO;  [facility];  Service: Endoscopy;  Laterality: N/A;   BIOPSY  09/09/2021   Procedure: BIOPSY;  Surgeon: Vinetta Greening, DO;  [facility]: [facility];  Service: Endoscopy;;   COLONOSCOPY  04/2021   3mm rectal polyp removed   ESOPHAGOGASTRODUODENOSCOPY  (EGD) WITH PROPOFOL  N/A 09/09/2021   Procedure: ESOPHAGOGASTRODUODENOSCOPY (EGD) WITH PROPOFOL ;  Surgeon: Vinetta Greening, DO;  [facility]: [facility];  Service: Endoscopy;  Laterality: N/A;  9:30am    Family History  Problem Relation Age of Onset   Heart failure Mother 25       Alive   Diabetes Mother    CAD Father 54       Died of MI   Diabetes Sister    CAD Brother    Cancer Maternal Grandmother        Throat cancer    CAD Paternal Grandfather 32       Died of MI   Heart disease Daughter        Rapid heart rate   Colon cancer Neg Hx     Social History   Tobacco Use   Smoking status: Former    Current packs/day: 0.00    Average packs/day: 0.3 packs/day for 35.0 years (8.8 ttl pk-yrs)    Types: Cigarettes    Quit date: 11/2023    Years since quitting: 0.0   Smokeless tobacco: Never   Tobacco comments:    Smokes 2 cigarettes in a day. 02/09/23 Tay  Vaping Use   Vaping status: Never Used  Substance Use Topics   Alcohol use: Not Currently    Alcohol/week:  9.0 - 13.0 standard drinks of alcohol    Types: 1 Glasses of wine, 8 - 12 Cans of beer per week    Comment: Drank beer, for 35 years, quit 5 years ago    Drug use: No    Medications: I have reviewed the patient's current medications. Allergies as of 11/26/2023       Reactions   Asa [aspirin ] Itching   Ibuprofen Itching   Tylenol  [acetaminophen ] Itching        Medication List        Accurate as of November 26, 2023  9:03 AM. If you have any questions, ask your nurse or doctor.          STOP taking these medications    cetirizine  10 MG tablet Commonly known as: ZYRTEC  Stopped by: Ian [name]   dextromethorphan -guaiFENesin  30-600 MG 12hr tablet Commonly known as: MUCINEX  DM Stopped by: Ian [name]   fluticasone  50 MCG/ACT nasal spray Commonly known as: FLONASE  Stopped by: Ian [name]   gabapentin  600 MG tablet Commonly known as: NEURONTIN  Stopped by: Ian [place] A  Yittel [name]       TAKE these medications    albuterol  108 (90 Base) MCG/ACT inhaler Commonly known as: Ventolin  HFA Inhale 1-2 puffs into the lungs every 4 (four) hours as needed for wheezing or shortness of breath.   ALPRAZolam  1 MG tablet Commonly known as: XANAX  Take 1 mg by mouth 3 (three) times daily as needed for sleep or anxiety.   atorvastatin  40 MG tablet Commonly known as: LIPITOR Take 1 tablet (40 mg total) by mouth daily. Please call office to schedule an appt for further refills. Thank you   Breztri  Aerosphere 160-9-4.8 MCG/ACT Aero inhaler Generic drug: budesonide -glycopyrrolate-formoterol  Inhale 2 puffs into the lungs in the morning and at bedtime.   clopidogrel  75 MG tablet Commonly known as: PLAVIX  Take 75 mg by mouth daily.   FENOFIBRATE  PO Take 135 mg by mouth daily.   lisinopril 10 MG tablet Commonly known as: ZESTRIL Take 10 mg by mouth daily.   pantoprazole  40 MG tablet Commonly known as: PROTONIX  Take 40 mg by mouth daily.   roflumilast  500 MCG Tabs tablet Commonly known as: DALIRESP  Take 1 tablet (500 mcg total) by mouth daily.   tamsulosin 0.4 MG Caps capsule Commonly known as: FLOMAX Take 0.4 mg by mouth daily.   Vitamin D  (Ergocalciferol ) 1.25 MG (50000 UNIT) Caps capsule Commonly known as: DRISDOL  Take 50,000 Units by mouth every 7 (seven) days. Friday         ROS:  Constitutional: negative for chills, fatigue, and fevers Eyes: negative for visual disturbance and pain Ears, nose, mouth, throat, and face: negative for ear drainage, sore mouth, and sinus problems Respiratory: negative for cough, wheezing, and shortness of breath Cardiovascular: negative for chest pain and palpitations Gastrointestinal: negative for abdominal pain, nausea, reflux symptoms, and vomiting Genitourinary:negative for dysuria and frequency Integument/breast: negative for dryness and rash Hematologic/lymphatic: negative for bleeding and  lymphadenopathy Musculoskeletal:positive for back pain, negative for neck pain Neurological: negative for dizziness and tremors Endocrine: negative for temperature intolerance  Blood pressure 121/72, pulse 61, temperature 97.8 F (36.6 C), temperature source Oral, resp. rate 18, height 5' 9 (1.753 m), weight 212 lb (96.2 kg), SpO2 91%. Physical Exam Vitals reviewed.  Constitutional:      Appearance: Normal appearance.  HENT:     Head: Normocephalic and atraumatic.     Comments: 3 to 4 cm soft and mobile mass on  the right lateral scalp, nontender to palpation, no overlying skin changes  Eyes:     Extraocular Movements: Extraocular movements intact.     Pupils: Pupils are equal, round, and reactive to light.    Cardiovascular:     Rate and Rhythm: Normal rate and regular rhythm.  Pulmonary:     Effort: Pulmonary effort is normal.     Breath sounds: Normal breath sounds.  Abdominal:     General: There is no distension.     Palpations: Abdomen is soft.     Tenderness: There is no abdominal tenderness.   Musculoskeletal:        General: Normal range of motion.     Cervical back: Normal range of motion.   Skin:    General: Skin is warm and dry.   Neurological:     General: No focal deficit present.     Mental Status: He is alert and oriented to person, place, and time.   Psychiatric:        Mood and Affect: Mood normal.        Behavior: Behavior normal.     Results: No results found for this or any previous visit (from the past 48 hours).  No results found.   Assessment & Plan:  CHAZE HRUSKA is a 63 y.o. male who presents for evaluation of a head lipoma.  -We discussed the pathophysiology of lipomas, and the recommendation for surgical excision.  I discussed that if the area is bothering the patient, we can proceed with removal of the lipoma.  I did discuss that it is very unlikely that this lipoma is causing his headaches or his left eye bulging -The risk and  benefits of excision of head lipoma were discussed including but not limited to bleeding, infection, injury to surrounding structures, need for additional procedures, and lipoma recurrence.  After careful consideration, DONTAVIS TSCHANTZ has decided to proceed with removal.  -Patient tentatively scheduled for lipoma excision in the minor procedure room at the hospital on 7/2 -Advised that he is going to need his hold his Plavix  for 5 days prior to the procedure -Information provided to the patient regarding lipomas  All questions were answered to the satisfaction of the patient and family.  Note: Portions of this report may have been transcribed using voice recognition software. Every effort has been made to ensure accuracy; however, inadvertent computerized transcription errors may still be present.   Lidia Reels, DO [facility] [address] [place], [place] [address] [phone] (office)

## 2023-12-16 ENCOUNTER — Ambulatory Visit (HOSPITAL_COMMUNITY)
Admission: RE | Admit: 2023-12-16 | Discharge: 2023-12-16 | Disposition: A | Discharge status: Home / Self Care | Attending: Surgery | Admitting: Surgery

## 2023-12-16 ENCOUNTER — Encounter (HOSPITAL_COMMUNITY)
Admission: RE | Disposition: A | Discharge status: Home / Self Care | Payer: Self-pay | Source: Home / Self Care | Attending: Surgery

## 2023-12-16 DIAGNOSIS — L720 Epidermal cyst: Secondary | ICD-10-CM | POA: Insufficient documentation

## 2023-12-16 DIAGNOSIS — Z87891 Personal history of nicotine dependence: Secondary | ICD-10-CM | POA: Diagnosis not present

## 2023-12-16 DIAGNOSIS — Z7902 Long term (current) use of antithrombotics/antiplatelets: Secondary | ICD-10-CM | POA: Insufficient documentation

## 2023-12-16 DIAGNOSIS — D170 Benign lipomatous neoplasm of skin and subcutaneous tissue of head, face and neck: Secondary | ICD-10-CM | POA: Diagnosis present

## 2023-12-16 HISTORY — PX: EXCISION MASS HEAD: SHX6702

## 2023-12-16 SURGERY — EXCISION, MASS, HEAD
Anesthesia: LOCAL | Site: Head

## 2023-12-16 MED ORDER — STERILE WATER FOR IRRIGATION IR SOLN
Status: DC | PRN
  Administered 2023-12-16: 500 mL

## 2023-12-16 MED ORDER — LIDOCAINE HCL 1 % IJ SOLN
INTRAMUSCULAR | Status: DC | PRN
  Administered 2023-12-16: 2 mL via INTRADERMAL
  Administered 2023-12-16: 5 mL via INTRADERMAL
  Administered 2023-12-16: 10 mL via INTRADERMAL

## 2023-12-16 MED ORDER — CHLORHEXIDINE GLUCONATE CLOTH 2 % EX PADS: 6.0000 | MEDICATED_PAD | Freq: Once | CUTANEOUS | Status: DC

## 2023-12-16 MED ORDER — BACITRACIN ZINC 500 UNIT/GM EX OINT
TOPICAL_OINTMENT | CUTANEOUS | Status: DC | PRN
  Administered 2023-12-16: 1 via TOPICAL

## 2023-12-16 MED ORDER — BACITRACIN ZINC 500 UNIT/GM EX OINT
TOPICAL_OINTMENT | CUTANEOUS | Status: AC
  Filled 2023-12-16: qty 0.9

## 2023-12-16 MED ORDER — CLOPIDOGREL BISULFATE 75 MG PO TABS
75.0000 mg | ORAL_TABLET | Freq: Every day | ORAL | Status: AC
Start: 2023-12-19 — End: ?

## 2023-12-16 MED ORDER — LIDOCAINE HCL (PF) 1 % IJ SOLN
INTRAMUSCULAR | Status: AC
  Filled 2023-12-16: qty 30

## 2023-12-16 SURGICAL SUPPLY — 19 items
DRAPE EENT ADH APERT 31X51 STR (DRAPES) IMPLANT
DRAPE HALF SHEET 40X57 (DRAPES) IMPLANT
ELECT NDL TIP 2.8 STRL (NEEDLE) IMPLANT
ELECT NEEDLE TIP 2.8 STRL (NEEDLE) ×1 IMPLANT
ELECTRODE REM PT RTRN 9FT ADLT (ELECTROSURGICAL) ×1 IMPLANT
GAUZE SPONGE 4X4 12PLY STRL (GAUZE/BANDAGES/DRESSINGS) IMPLANT
GLOVE BIOGEL PI IND STRL 6.5 (GLOVE) ×1 IMPLANT
GLOVE BIOGEL PI IND STRL 7.0 (GLOVE) ×2 IMPLANT
GLOVE SURG SS PI 6.5 STRL IVOR (GLOVE) ×1 IMPLANT
GOWN STRL REIN XL LVL4 (GOWNS) IMPLANT
NDL HYPO 21X1.5 SAFETY (NEEDLE) IMPLANT
NEEDLE HYPO 21X1.5 SAFETY (NEEDLE) ×1 IMPLANT
PENCIL HANDSWITCHING (ELECTRODE) ×1 IMPLANT
SPONGE GAUZE 2X2 8PLY STRL LF (GAUZE/BANDAGES/DRESSINGS) ×1 IMPLANT
SUT 3-0 BLK 1X30 PSL (SUTURE) IMPLANT
SUT VIC AB 3-0 SH 27X BRD (SUTURE) IMPLANT
SYR CONTROL 10ML LL (SYRINGE) IMPLANT
TOWEL OR 17X26 4PK STRL BLUE (TOWEL DISPOSABLE) ×1 IMPLANT
WATER STERILE IRR 500ML POUR (IV SOLUTION) IMPLANT

## 2023-12-16 NOTE — Progress Notes (Signed)
 Minor procedure performed.  Patient doing well NAD.  Ambulated to the car.

## 2023-12-16 NOTE — Discharge Instructions (Signed)
 Discharge Instructions  Activity  You are advised to go directly home from the hospital. Resume light activity. No heavy lifting over 10 lbs or strenuous exercise.  Fluids and Diet Regular diet  Medications  If you have not had a bowel movement in 24 hours, take 2 tablespoons over the counter Milk of mag.             You May resume your blood thinners tomorrow (Aspirin , coumadin, or other).   Operative Site  You have external stitches in place.  These will be removed at your follow up visit. Ok to English as a second language teacher. Keep wound clean and dry. No baths or swimming. No lifting more than 10 pounds.  Contact Information: If you have questions or concerns, please call our office, [phone], Monday- Thursday 8AM-5PM and Friday 8AM-12Noon.  If it is after hours or on the weekend, please call [facility] Main Number, [phone], and ask to speak to the surgeon on call for Dr. Evonnie at [facility].   SPECIFIC COMPLICATIONS TO WATCH FOR: Inability to urinate Fever over 101? F by mouth Nausea and vomiting lasting longer than 24 hours. Pain not relieved by medication ordered Swelling around the operative site Increased redness, warmth, hardness, around operative area Numbness, tingling, or cold fingers or toes Blood -soaked dressing, (small amounts of oozing may be normal) Increasing and progressive drainage from surgical area or exam site

## 2023-12-16 NOTE — Op Note (Signed)
 [facility] Operative Note  12/16/23  Preoperative Diagnosis: Head cyst   Postoperative Diagnosis: Same   Procedure(s) Performed: Excision of right head cyst, 4 cm in size   Surgeon: Dorothyann Brittle, DO    Assistants: Montie Eke, RNFA   Anesthesia: None   Specimens: Head cyst   Estimated Blood Loss: Minimal   Blood Replacement: None    Complications: None   Wound Class: Clean contaminated   Operative Indications: Patient is a 63 year old male who has a cyst on the right side of his head, which has been increasing in size recently.  He believes the cyst is causing him headaches, and he would like it removed at this time.  Findings: 4 cm epidermoid cyst of the head   Procedure: The patient was taken to the minor procedure room.  Patient's vitals were obtained and noted to be stable.  He was then positioned in a left lateral decubitus position and the head cyst was prepped with Betadine and draped in a sterile fashion.  A time-out was completed verifying correct patient, procedure, site, positioning, and implant(s) and/or special equipment prior to beginning this procedure.  The cyst and overlying skin was localized with Marcaine.  A linear incision was made overlying the cyst. Using electrocautery, the dermis and subcutaneous tissues were dissected around the cyst. The cyst was removed in its entirety and sent to pathology for evaluation. Hemostasis was achieved using electrocautery.  The patient had some excess skin present, so this was excised.  The dermis was approximated using 3-0 Vicryl sutures. The skin was closed using 3-0 nylon in a vertical mattress fashion.  The incision site was dressed with bacitracin, 4 x 4's, and Coban around the head to create a pressure dressing.  The patient tolerated the procedure well.  Final inspection revealed acceptable hemostasis. All counts were correct at the end of the case.  Patient will be discharged home from  the minor procedure room.   Dorothyann Brittle, DO  [facility] [address] [place], [place] [address] [phone] (office)

## 2023-12-16 NOTE — Progress Notes (Signed)
 [facility]  Spoke with the patient at the completion of the procedure.  He has external stitches in place that will be removed at his follow-up visit.  He can take Tylenol  and ibuprofen as needed for pain.  He should maintain his pressure dressing for at least a couple of hours today, and then can be removed.  He can shower starting tomorrow.  He should restart his Plavix  on Saturday..  The patient will follow-up with me in 2 weeks for suture removal.  All questions were answered to his expressed satisfaction.  Dorothyann Brittle, DO [facility] [address] [place], [place] [address] [phone] (office)

## 2023-12-16 NOTE — Interval H&P Note (Signed)
 History and Physical Interval Note:  12/16/2023 8:25 AM  Ian [name]  has presented today for surgery, with the diagnosis of LIPOMA, HEAD 3 CM.  The various methods of treatment have been discussed with the patient and family. After consideration of risks, benefits and other options for treatment, the patient has consented to  Procedure(s) with comments: EXCISION, MASS, HEAD (N/A) - MINOR PROCEDURE ROOM as a surgical intervention.  The patient's history has been reviewed, patient examined, no change in status, stable for surgery.  I have reviewed the patient's chart and labs.  Questions were answered to the patient's satisfaction.     Jefferey Lippmann A Jerard Bays

## 2023-12-17 ENCOUNTER — Encounter (HOSPITAL_COMMUNITY): Payer: Self-pay | Admitting: Surgery

## 2023-12-17 LAB — SURGICAL PATHOLOGY

## 2023-12-30 ENCOUNTER — Encounter: Payer: Self-pay | Admitting: Surgery

## 2023-12-30 ENCOUNTER — Ambulatory Visit (INDEPENDENT_AMBULATORY_CARE_PROVIDER_SITE_OTHER): Admitting: Surgery

## 2023-12-30 VITALS — BP 132/76 | HR 85 | Temp 97.9°F | Resp 16 | Ht 69.0 in | Wt 207.0 lb

## 2023-12-30 DIAGNOSIS — Z09 Encounter for follow-up examination after completed treatment for conditions other than malignant neoplasm: Secondary | ICD-10-CM

## 2023-12-30 NOTE — Progress Notes (Unsigned)
 [facility] Note   HPI:  63 y.o. Male presents to clinic for post-op follow-up status post excision of a right head cyst on 7/2.  Patient overall has been doing well since his procedure.  He had a small amount of bleeding when he caught one of the stitches on his comb, but otherwise has no significant complaints.  He denies any pain associated with the area.  Denies fevers and chills.  Review of Systems:  All other review of systems: otherwise negative   Vital Signs:  BP 132/76   Pulse 85   Temp 97.9 F (36.6 C) (Oral)   Resp 16   Ht 5' 9 (1.753 m)   Wt 207 lb (93.9 kg)   SpO2 92%   BMI 30.57 kg/m    Physical Exam:  Physical Exam Vitals reviewed.  Constitutional:      Appearance: Normal appearance.  Skin:    General: Skin is warm and dry.     Comments: Well-healing right head cyst excision site with sutures in place, no significant erythema or induration  Neurological:     Mental Status: He is alert.     Laboratory studies: None   Imaging:  None   Pathology: A. CYST, HEAD, EXCISION:  - Benign epidermal inclusion cyst with focal disruption and associated  inflammation   Assessment:  63 y.o. yo Male who presents for follow-up status post excision of right head cyst on 7/2.  Plan:  - Patient has been doing very well since the surgery.  No significant issues at his incision site - Sutures removed without any issues - Follow up as needed  All of the above recommendations were discussed with the patient, and all of patient's questions were answered to his expressed satisfaction.  Note: Portions of this report may have been transcribed using voice recognition software. Every effort has been made to ensure accuracy; however, inadvertent computerized transcription errors may still be present.   Dorothyann Brittle, DO [facility] [address] [place] [address], [place] [address] [phone] (office)

## 2024-03-23 ENCOUNTER — Ambulatory Visit (HOSPITAL_COMMUNITY)
Admission: RE | Admit: 2024-03-23 | Discharge: 2024-03-23 | Disposition: A | Discharge status: Home / Self Care | Source: Ambulatory Visit | Attending: Specialist | Admitting: Specialist

## 2024-03-23 DIAGNOSIS — F17210 Nicotine dependence, cigarettes, uncomplicated: Secondary | ICD-10-CM | POA: Insufficient documentation

## 2024-03-23 DIAGNOSIS — Z122 Encounter for screening for malignant neoplasm of respiratory organs: Secondary | ICD-10-CM | POA: Insufficient documentation

## 2024-03-23 DIAGNOSIS — Z87891 Personal history of nicotine dependence: Secondary | ICD-10-CM | POA: Diagnosis present

## 2024-03-25 ENCOUNTER — Other Ambulatory Visit: Payer: Self-pay

## 2024-03-25 DIAGNOSIS — F17210 Nicotine dependence, cigarettes, uncomplicated: Secondary | ICD-10-CM

## 2024-03-25 DIAGNOSIS — Z87891 Personal history of nicotine dependence: Secondary | ICD-10-CM

## 2024-03-25 DIAGNOSIS — Z122 Encounter for screening for malignant neoplasm of respiratory organs: Secondary | ICD-10-CM

## 2024-06-01 ENCOUNTER — Encounter: Payer: Self-pay | Admitting: Emergency Medicine

## 2024-06-01 ENCOUNTER — Ambulatory Visit: Admitting: Emergency Medicine

## 2024-06-01 VITALS — BP 112/70 | HR 54 | Temp 97.8°F | Ht 69.0 in | Wt 210.4 lb

## 2024-06-01 DIAGNOSIS — J449 Chronic obstructive pulmonary disease, unspecified: Secondary | ICD-10-CM | POA: Diagnosis not present

## 2024-06-01 DIAGNOSIS — R042 Hemoptysis: Secondary | ICD-10-CM

## 2024-06-01 DIAGNOSIS — R0609 Other forms of dyspnea: Secondary | ICD-10-CM

## 2024-06-01 DIAGNOSIS — Z720 Tobacco use: Secondary | ICD-10-CM

## 2024-06-01 NOTE — Patient Instructions (Addendum)
 Please continue to use Breztri  2 puffs twice a day.  Rinse and gargle after using.  This is a maintenance medication and should take it reliably every day as prescribed. Agree with restarting your Daliresp  once daily.  Continue this every day Keep your albuterol  inhaler available to use if you need it for symptoms.  You can use 2 puffs up to every 4 hours if needed for shortness of breath, chest tightness, wheezing. Flu shot up-to-date We will check a walking oxygen test to see if you qualify to have oxygen at home to use when you exert yourself We reviewed your lung cancer screening CT scan of the chest that was done in October.  It looked good.  You will be due for another scan in October 2026.  We will arrange this for you You need to work on decreasing your cigarettes.  Ultimate goal will be to stop altogether. Follow-up in our office in 1 year.  Please call if have any new respiratory symptoms or questions so we can see you sooner.

## 2024-06-01 NOTE — Assessment & Plan Note (Signed)
 Please continue to use Breztri  2 puffs twice a day.  Rinse and gargle after using.  This is a maintenance medication and should take it reliably every day as prescribed. Agree with restarting your Daliresp  once daily.  Continue this every day Keep your albuterol  inhaler available to use if you need it for symptoms.  You can use 2 puffs up to every 4 hours if needed for shortness of breath, chest tightness, wheezing. Flu shot up-to-date We will check a walking oxygen test to see if you qualify to have oxygen at home to use when you exert yourself Follow-up in our office in 1 year.  Please call if have any new respiratory symptoms or questions so we can see you sooner.

## 2024-06-01 NOTE — Assessment & Plan Note (Signed)
 Has not recurred.  His CT chest was reassuring in October.  If he has more episodes then he may require an airway inspection by bronchoscopy.

## 2024-06-01 NOTE — Assessment & Plan Note (Signed)
 He needs to be evaluated for possible exertional hypoxemia.  We will perform a walking oximetry today.

## 2024-06-01 NOTE — Assessment & Plan Note (Signed)
 We reviewed your lung cancer screening CT scan of the chest that was done in October.  It looked good.  You will be due for another scan in October 2026.  We will arrange this for you You need to work on decreasing your cigarettes.  Ultimate goal will be to stop altogether.

## 2024-06-01 NOTE — Progress Notes (Signed)
 Subjective:    Patient ID: Ian [name], male    DOB: Apr 02, 1961, 63 y.o.   MRN: [id number]  HPI Mr. Keech is a 7 with a history of tobacco use and emphysematous COPD, chronic bronchitic symptoms.  Also with a history of CVA, aortic atherosclerotic disease (on Plavix ), PUD/gastric ulcer, poor medical compliance.  When he was last seen in May 2025 he mentioned Today he reports that he has experienced cramps in his chest intermittently, sometimes w activity. Has been present for a few years. He coughs all day - productive of mucous, tan sometimes with some black in it. Has not seen any overt hemoptysis.  He is supposed to be on Breztri , Daliresp . He reports that he is using the breztri  reliably. He was off daliresp  but he just got it refilled and restarted it a few days ago. No abx or pred reported. He quit smoking for about 3 months but is back to it > about 2-3 cig a day.   Pulmonary function testing 02/03/2018 reviewed by me, shows severe obstruction with a positive bronchodilator response, lung volumes consistent with superimposed restriction, decreased diffusion capacity that does not fully correct to the normal range when adjusted for his alveolar volume.  Lung cancer screening CT chest 03/23/2024 reviewed by me, showed no mediastinal or hilar adenopathy, centrilobular and paraseptal emphysematous changes with posterior lower lobe scar.  No suspicious nodules.  No consolidation or effusion.  This was read as a lung RADS 1 study and a 1 year follow-up was recommended 2 episodes of hemoptysis.    Review of Systems As per HPI  Past Medical History:  Diagnosis Date   Anxiety    COPD (chronic obstructive pulmonary disease) (HCC)    Depression    Hx of cardiovascular stress test    Lexiscan  Myoview (09/2013):  No ischemia, EF 62%; NORMAL   Leg pain    ABIs (09/29/13):  Normal.   Stomach ulcer    Stroke (HCC)    Tobacco abuse     Family History  Problem Relation Age of Onset    Heart failure Mother 25       Alive   Diabetes Mother    CAD Father 45       Died of MI   Diabetes Sister    CAD Brother    Cancer Maternal Grandmother        Throat cancer    CAD Paternal Grandfather 74       Died of MI   Heart disease Daughter        Rapid heart rate   Colon cancer Neg Hx      Social History   Socioeconomic History   Marital status: Divorced    Spouse name: Not on file   Number of children: 5   Years of education: Not on file   Highest education level: Not on file  Occupational History    Employer: NOT EMPLOYED  Tobacco Use   Smoking status: Former    Current packs/day: 0.00    Average packs/day: 0.3 packs/day for 35.0 years (8.8 ttl pk-yrs)    Types: Cigarettes    Quit date: 11/2023    Years since quitting: 0.5   Smokeless tobacco: Never   Tobacco comments:    Smokes 2 cigarettes in a day. 02/09/23 Tay  Vaping Use   Vaping status: Never Used  Substance and Sexual Activity   Alcohol use: Not Currently    Alcohol/week: 9.0 - 13.0 standard drinks of  alcohol    Types: 1 Glasses of wine, 8 - 12 Cans of beer per week    Comment: Drank beer, for 35 years, quit 5 years ago    Drug use: No   Sexual activity: Not on file  Other Topics Concern   Not on file  Social History Narrative   Lives with alone.  Cannot read and write.     Social Drivers of Health   Tobacco Use: Medium Risk (06/01/2024)   Patient History    Smoking Tobacco Use: Former    Smokeless Tobacco Use: Never    Passive Exposure: Not on file  Financial Resource Strain: Low Risk (09/07/2023)   Received from [facility]   Overall Financial Resource Strain (CARDIA)    Difficulty of Paying Living Expenses: Not hard at all  Food Insecurity: No Food Insecurity (09/07/2023)   Received from [facility]   Epic    Within the past 12 months, you worried that your food would run out before you got the money to buy more.: Never true    Within the past 12 months, the food you bought just  didn't last and you didn't have money to get more.: Never true  Transportation Needs: No Transportation Needs (09/07/2023)   Received from [facility] - Transportation    Lack of Transportation (Medical): No    Lack of Transportation (Non-Medical): No  Physical Activity: Not on file  Stress: Not on file  Social Connections: Not on file  Intimate Partner Violence: Not on file  Depression (EYV7-0): Not on file  Alcohol Screen: Not on file  Housing: Low Risk (09/07/2023)   Received from [facility]    In the last 12 months, was there a time when you were not able to pay the mortgage or rent on time?: No    In the past 12 months, how many times have you moved where you were living?: 0    At any time in the past 12 months, were you homeless or living in a shelter (including now)?: No  Utilities: Not At Risk (09/07/2023)   Received from [facility] Utilities    Threatened with loss of utilities: No  Health Literacy: Not on file    Allergies[1]  Medications Ordered Prior to Encounter[2]        Objective:   Physical Exam Vitals:   06/01/24 0918  BP: 112/70  Pulse: (!) 54  Temp: 97.8 F (36.6 C)  TempSrc: Temporal  SpO2: 95%  Weight: 210 lb 6.4 oz (95.4 kg)  Height: 5' 9 (1.753 m)   Gen: Pleasant, obese man, in no distress,  normal affect  ENT: No lesions,  mouth clear,  oropharynx clear, no postnasal drip  Neck: No JVD, no stridor  Lungs: No use of accessory muscles, no crackles or wheezing on normal respiration, no wheeze on forced expiration  Cardiovascular: RRR, heart sounds normal, no murmur or gallops, no peripheral edema  Musculoskeletal: No deformities, no cyanosis or clubbing  Neuro: alert, awake, tangential but redirectable.  Nonfocal.  Skin: Warm, no lesions or rash     Assessment & Plan:   COPD (chronic obstructive pulmonary disease) (HCC) Please continue to use Breztri  2 puffs twice a day.  Rinse and gargle after using.   This is a maintenance medication and should take it reliably every day as prescribed. Agree with restarting your Daliresp  once daily.  Continue this every day Keep your albuterol  inhaler available  to use if you need it for symptoms.  You can use 2 puffs up to every 4 hours if needed for shortness of breath, chest tightness, wheezing. Flu shot up-to-date We will check a walking oxygen test to see if you qualify to have oxygen at home to use when you exert yourself Follow-up in our office in 1 year.  Please call if have any new respiratory symptoms or questions so we can see you sooner.  Tobacco abuse We reviewed your lung cancer screening CT scan of the chest that was done in October.  It looked good.  You will be due for another scan in October 2026.  We will arrange this for you You need to work on decreasing your cigarettes.  Ultimate goal will be to stop altogether.  DOE (dyspnea on exertion) He needs to be evaluated for possible exertional hypoxemia.  We will perform a walking oximetry today.  Hemoptysis Has not recurred.  His CT chest was reassuring in October.  If he has more episodes then he may require an airway inspection by bronchoscopy.  I personally spent a total of 30 minutes in the care of the patient today including preparing to see the patient, getting/reviewing separately obtained history, performing a medically appropriate exam/evaluation, counseling and educating, referring and communicating with other health care professionals, documenting clinical information in the EHR, independently interpreting results, and communicating results.   Lamar Chris, MD, PhD 06/01/2024, 10:01 AM [facility] [phone] or if no answer before 7:00PM call [phone] For any issues after 7:00PM please call eLink [phone]     [1]  Allergies Allergen Reactions   Dorethia Hammonds ] Itching   Ibuprofen Itching   Tylenol  [Acetaminophen ] Itching  [2]  Current  Outpatient Medications on File Prior to Visit  Medication Sig Dispense Refill   albuterol  (VENTOLIN  HFA) 108 (90 Base) MCG/ACT inhaler Inhale 1-2 puffs into the lungs every 4 (four) hours as needed for wheezing or shortness of breath. 18 each 1   ALPRAZolam  (XANAX ) 1 MG tablet Take 1 mg by mouth 3 (three) times daily as needed for sleep or anxiety.     atorvastatin  (LIPITOR) 40 MG tablet Take 1 tablet (40 mg total) by mouth daily. Please call office to schedule an appt for further refills. Thank you 90 tablet 1   budesonide -glycopyrrolate-formoterol  (BREZTRI  AEROSPHERE) 160-9-4.8 MCG/ACT AERO inhaler Inhale 2 puffs into the lungs in the morning and at bedtime. 10.7 each 5   Choline Fenofibrate  (FENOFIBRIC ACID) 135 MG CPDR Take 135 mg by mouth daily.     clopidogrel  (PLAVIX ) 75 MG tablet Take 1 tablet (75 mg total) by mouth daily.     lisinopril (ZESTRIL) 10 MG tablet Take 10 mg by mouth daily.     pantoprazole  (PROTONIX ) 40 MG tablet Take 40 mg by mouth daily.     roflumilast  (DALIRESP ) 500 MCG TABS tablet Take 1 tablet (500 mcg total) by mouth daily. 30 tablet 11   tamsulosin (FLOMAX) 0.4 MG CAPS capsule Take 0.4 mg by mouth daily.      Vitamin D , Ergocalciferol , (DRISDOL ) 50000 UNITS CAPS capsule Take 50,000 Units by mouth every 7 (seven) days. Friday     gabapentin  (NEURONTIN ) 600 MG tablet Take 600 mg by mouth 3 (three) times daily.     No current facility-administered medications on file prior to visit.

## 2024-06-15 ENCOUNTER — Other Ambulatory Visit: Payer: Self-pay | Admitting: Primary Care
